# Patient Record
Sex: Female | Born: 1980 | Hispanic: No | Marital: Married | Smoking: Never smoker
Health system: Southern US, Community
[De-identification: ages and names within clinical notes are randomized; demographics above are authoritative.]

## PROBLEM LIST (undated history)

## (undated) ENCOUNTER — Inpatient Hospital Stay (HOSPITAL_COMMUNITY): Payer: Self-pay

## (undated) DIAGNOSIS — D219 Benign neoplasm of connective and other soft tissue, unspecified: Secondary | ICD-10-CM

## (undated) HISTORY — PX: NO PAST SURGERIES: SHX2092

---

## 2015-09-18 ENCOUNTER — Encounter (HOSPITAL_COMMUNITY): Payer: Self-pay | Admitting: *Deleted

## 2015-09-18 ENCOUNTER — Emergency Department (HOSPITAL_COMMUNITY)
Admission: EM | Admit: 2015-09-18 | Discharge: 2015-09-18 | Disposition: A | Discharge status: Home / Self Care | Payer: Medicaid Other | Attending: Physician Assistant | Admitting: Physician Assistant

## 2015-09-18 ENCOUNTER — Emergency Department (HOSPITAL_COMMUNITY): Payer: Medicaid Other

## 2015-09-18 DIAGNOSIS — O3481 Maternal care for other abnormalities of pelvic organs, first trimester: Secondary | ICD-10-CM | POA: Insufficient documentation

## 2015-09-18 DIAGNOSIS — Z3A10 10 weeks gestation of pregnancy: Secondary | ICD-10-CM | POA: Insufficient documentation

## 2015-09-18 DIAGNOSIS — O200 Threatened abortion: Secondary | ICD-10-CM | POA: Diagnosis not present

## 2015-09-18 DIAGNOSIS — O209 Hemorrhage in early pregnancy, unspecified: Secondary | ICD-10-CM | POA: Diagnosis present

## 2015-09-18 DIAGNOSIS — D259 Leiomyoma of uterus, unspecified: Secondary | ICD-10-CM | POA: Insufficient documentation

## 2015-09-18 DIAGNOSIS — O3411 Maternal care for benign tumor of corpus uteri, first trimester: Secondary | ICD-10-CM

## 2015-09-18 LAB — CBC WITH DIFFERENTIAL/PLATELET
BASOS ABS: 0 10*3/uL (ref 0.0–0.1)
Basophils Relative: 1 %
EOS PCT: 3 %
Eosinophils Absolute: 0.2 10*3/uL (ref 0.0–0.7)
HEMATOCRIT: 37 % (ref 36.0–46.0)
Hemoglobin: 12.7 g/dL (ref 12.0–15.0)
LYMPHS ABS: 2.4 10*3/uL (ref 0.7–4.0)
LYMPHS PCT: 32 %
MCH: 30 pg (ref 26.0–34.0)
MCHC: 34.3 g/dL (ref 30.0–36.0)
MCV: 87.3 fL (ref 78.0–100.0)
MONO ABS: 0.5 10*3/uL (ref 0.1–1.0)
MONOS PCT: 6 %
Neutro Abs: 4.4 10*3/uL (ref 1.7–7.7)
Neutrophils Relative %: 58 %
PLATELETS: 214 10*3/uL (ref 150–400)
RBC: 4.24 MIL/uL (ref 3.87–5.11)
RDW: 13.3 % (ref 11.5–15.5)
WBC: 7.6 10*3/uL (ref 4.0–10.5)

## 2015-09-18 LAB — BASIC METABOLIC PANEL
Anion gap: 8 (ref 5–15)
BUN: 8 mg/dL (ref 6–20)
CHLORIDE: 105 mmol/L (ref 101–111)
CO2: 18 mmol/L — ABNORMAL LOW (ref 22–32)
CREATININE: 0.54 mg/dL (ref 0.44–1.00)
Calcium: 8.7 mg/dL — ABNORMAL LOW (ref 8.9–10.3)
GFR calc Af Amer: >60 mL/min (ref >60)
GFR calc non Af Amer: >60 mL/min (ref >60)
GLUCOSE: 83 mg/dL (ref 65–99)
POTASSIUM: 3.7 mmol/L (ref 3.5–5.1)
Sodium: 131 mmol/L — ABNORMAL LOW (ref 135–145)

## 2015-09-18 LAB — WET PREP, GENITAL
Clue Cells Wet Prep HPF POC: NONE SEEN
Trich, Wet Prep: NONE SEEN
WBC WET PREP: NONE SEEN
Yeast Wet Prep HPF POC: NONE SEEN

## 2015-09-18 LAB — I-STAT BETA HCG BLOOD, ED (MC, WL, AP ONLY): I-stat hCG, quantitative: >2000 m[IU]/mL — ABNORMAL HIGH (ref <5)

## 2015-09-18 LAB — ABO/RH: ABO/RH(D): O POS

## 2015-09-18 NOTE — ED Notes (Addendum)
Per pt and family, lmp was 8/19 and pt had + home preg test. Began having light vaginal bleeding 2 days ago.

## 2015-09-18 NOTE — ED Provider Notes (Signed)
CSN: [id number]     Arrival date & time 09/18/15  1714 History   By signing my name below, I, Clement Sayres, attest that this documentation has been prepared under the direction and in the presence of Debroah Baller, NP .  Electronically Signed: Clement Sayres, ED Scribe. 09/18/2015. 9:25 PM.    Chief Complaint  Patient presents with  . Vaginal Bleeding   The history is provided by the patient. No language interpreter was used.   HPI Comments: Erica Roth is a 34 y.o. G1 P0 @ approximately [redacted] weeks gestation presents to the Emergency Department complaining of mild, intermitent vaginal bleeding that began five days ago, resolved after two days and reappeared today. Pt reports that the color of the discharge began as red and is currently brown. Pt has tried over the counter medications with no alleviation. Pt denies prior pregnancy. She has not been seen by an OB regarding the current pregnancy, and has not had an ultrasound. Pt is currently taking over the counter prenatal vitamins. Pt has not had any pap-smears or pelvic exams. Pt denies having intercourse within the past two weeks. She also denies abdominal pain.      History reviewed. No pertinent past medical history. History reviewed. No pertinent past surgical history. History reviewed. No pertinent family history. Social History  Substance Use Topics  . Smoking status: Never Smoker   . Smokeless tobacco: None  . Alcohol Use: No   OB History    Gravida Para Term Preterm AB TAB SAB Ectopic Multiple Living   1              Review of Systems  Constitutional: Negative for fever.  Gastrointestinal: Negative for abdominal pain.  Genitourinary: Positive for vaginal bleeding.  All other systems reviewed and are negative.     Allergies  Review of patient's allergies indicates no known allergies.  Home Medications   Prior to Admission medications   Not on File   BP 114/60 mmHg  Pulse 72  Temp(Src) 97.6 F (36.4 C) (Oral)   Resp 18  SpO2 100%  LMP 07/12/2015 Physical Exam  Constitutional: She is oriented to person, place, and time. She appears well-developed and well-nourished. No distress.  HENT:  Head: Normocephalic and atraumatic.  Eyes: Conjunctivae and EOM are normal.  Neck: Normal range of motion. Neck supple.  Cardiovascular: Normal rate, regular rhythm and normal heart sounds.   Pulmonary/Chest: Effort normal and breath sounds normal. No respiratory distress.  Abdominal: Bowel sounds are normal. There is no tenderness. There is no CVA tenderness.  No CVA tenderness  Genitourinary:  External genitalia without lesions, brown d/c vaginal vault. No CMT, the uterus is enlarged and irregular larger than expected by LMP  Musculoskeletal: Normal range of motion.  Neurological: She is alert and oriented to person, place, and time.  Skin: Skin is warm and dry.  Psychiatric: She has a normal mood and affect. Her behavior is normal.  Nursing note and vitals reviewed.   ED Course  Procedures  DIAGNOSTIC STUDIES: Oxygen Saturation is 97% on RA, normal by my interpretation.    COORDINATION OF CARE: Discussed treatment plan with pt. Pt agreed to plan.   Labs Review Results for orders placed or performed during the hospital encounter of 09/18/15 (from the past 24 hour(s))  CBC with Differential/Platelet     Status: None   Collection Time: 09/18/15  5:49 PM  Result Value Ref Range   WBC 7.6 4.0 - 10.5 K/uL   RBC  4.24 3.87 - 5.11 MIL/uL   Hemoglobin 12.7 12.0 - 15.0 g/dL   HCT 37.0 36.0 - 46.0 %   MCV 87.3 78.0 - 100.0 fL   MCH 30.0 26.0 - 34.0 pg   MCHC 34.3 30.0 - 36.0 g/dL   RDW 13.3 11.5 - 15.5 %   Platelets 214 150 - 400 K/uL   Neutrophils Relative % 58 %   Neutro Abs 4.4 1.7 - 7.7 K/uL   Lymphocytes Relative 32 %   Lymphs Abs 2.4 0.7 - 4.0 K/uL   Monocytes Relative 6 %   Monocytes Absolute 0.5 0.1 - 1.0 K/uL   Eosinophils Relative 3 %   Eosinophils Absolute 0.2 0.0 - 0.7 K/uL   Basophils  Relative 1 %   Basophils Absolute 0.0 0.0 - 0.1 K/uL  Basic metabolic panel     Status: Abnormal   Collection Time: 09/18/15  5:49 PM  Result Value Ref Range   Sodium 131 (L) 135 - 145 mmol/L   Potassium 3.7 3.5 - 5.1 mmol/L   Chloride 105 101 - 111 mmol/L   CO2 18 (L) 22 - 32 mmol/L   Glucose, Bld 83 65 - 99 mg/dL   BUN 8 6 - 20 mg/dL   Creatinine, Ser 0.54 0.44 - 1.00 mg/dL   Calcium 8.7 (L) 8.9 - 10.3 mg/dL   GFR calc non Af Amer >60 >60 mL/min   GFR calc Af Amer >60 >60 mL/min   Anion gap 8 5 - 15  I-Stat beta hCG blood, ED ([facility], [facility], [facility] only)     Status: Abnormal   Collection Time: 09/18/15  5:56 PM  Result Value Ref Range   I-stat hCG, quantitative >2000.0 (H) <5 mIU/mL   Comment 3          ABO/Rh     Status: None   Collection Time: 09/18/15  6:20 PM  Result Value Ref Range   ABO/RH(D) O POS    No rh immune globuloin NOT A RH IMMUNE GLOBULIN CANDIDATE, PT RH POSITIVE   Wet prep, genital     Status: None   Collection Time: 09/18/15  8:20 PM  Result Value Ref Range   Yeast Wet Prep HPF POC NONE SEEN NONE SEEN   Trich, Wet Prep NONE SEEN NONE SEEN   Clue Cells Wet Prep HPF POC NONE SEEN NONE SEEN   WBC, Wet Prep HPF POC NONE SEEN NONE SEEN     Imaging Review US Ob Comp Less 14 Wks  09/18/2015  CLINICAL DATA:  Vaginal bleeding in first trimester pregnancy. Gestational age by LMP of 9 weeks 5 days. EXAM: OBSTETRIC <14 WK [place] AND TRANSVAGINAL OB US TECHNIQUE: Both transabdominal and transvaginal ultrasound examinations were performed for complete evaluation of the gestation as well as the maternal uterus, adnexal regions, and pelvic cul-de-sac. Transvaginal technique was performed to assess early pregnancy. COMPARISON:  None. FINDINGS: Intrauterine gestational sac: Visualized/normal in shape. Yolk sac:  Not visualized Embryo:  Visualized Cardiac Activity: Visualized Heart Rate: 160  bpm CRL:  29  mm   9 w   5 d                  [place] EDC: 04/17/2016 Maternal uterus/adnexae: Multiple  uterine fibroids are seen, predominantly in the anterior corpus and fundus, with largest measuring 6.5 cm. One fibroid in the anterior lower uterine segment shows a area of cystic degeneration. Both ovaries are normal in appearance. No adnexal mass or free fluid identified. IMPRESSION: Single  living IUP measuring 9 weeks 5 days with [place] EDC of 04/17/2016. This is concordant with LMP. Multiple uterine fibroids, largest measuring 6.5 cm. Electronically Signed   By: Earle Gell M.D.   On: 09/18/2015 20:48   US Ob Transvaginal  09/18/2015  CLINICAL DATA:  Vaginal bleeding in first trimester pregnancy. Gestational age by LMP of 9 weeks 5 days. EXAM: OBSTETRIC <14 WK [place] AND TRANSVAGINAL OB US TECHNIQUE: Both transabdominal and transvaginal ultrasound examinations were performed for complete evaluation of the gestation as well as the maternal uterus, adnexal regions, and pelvic cul-de-sac. Transvaginal technique was performed to assess early pregnancy. COMPARISON:  None. FINDINGS: Intrauterine gestational sac: Visualized/normal in shape. Yolk sac:  Not visualized Embryo:  Visualized Cardiac Activity: Visualized Heart Rate: 160  bpm CRL:  29  mm   9 w   5 d                  [place] EDC: 04/17/2016 Maternal uterus/adnexae: Multiple uterine fibroids are seen, predominantly in the anterior corpus and fundus, with largest measuring 6.5 cm. One fibroid in the anterior lower uterine segment shows a area of cystic degeneration. Both ovaries are normal in appearance. No adnexal mass or free fluid identified. IMPRESSION: Single living IUP measuring 9 weeks 5 days with [place] EDC of 04/17/2016. This is concordant with LMP. Multiple uterine fibroids, largest measuring 6.5 cm. Electronically Signed   By: Earle Gell M.D.   On: 09/18/2015 20:48   I have personally reviewed and evaluated these images and lab results as part of my medical decision-making.  Consult with Dr. Elonda Husky, OB on call at [facility]. Will have patient follow up there in 2  weeks for repeat ultrasound. She will go there sooner for any problems.   MDM  34 y.o. female with vaginal bleeding in first trimester pregnancy stable for d/c without pain or heavy bleeding. She will follow up with [facility] in 2 weeks or sooner for heavy bleeding or pain. Pregnancy verification letter given and patient will try to start process for insurance. Discussed with the patient clinical, u/s and lab findings and plan of care. All questioned fully answered.    Final diagnoses:  Threatened abortion in early pregnancy  Uterine fibroids affecting pregnancy in first trimester, antepartum   I personally performed the services described in this documentation, which was scribed in my presence. The recorded information has been reviewed and is accurate.   [place], NP 09/18/15 2128  [place], MD 09/19/15 2255

## 2015-09-18 NOTE — ED Notes (Signed)
The pt returned from [place]

## 2015-09-18 NOTE — ED Notes (Signed)
The pt had a positive preg test one month ago and today she has had some spotting

## 2015-09-18 NOTE — Discharge Instructions (Signed)
I spoke with Dr. Elonda Husky the [facility] doctor at [facility] hospital and he wants you to come there in 2 weeks for recheck and a follow up ultrasound. If your bleeding increases, you develop severe cramping pain or other problems go sooner.

## 2015-09-18 NOTE — ED Notes (Signed)
To us

## 2015-09-19 LAB — GC/CHLAMYDIA PROBE AMP (~~LOC~~) NOT AT ARMC
CHLAMYDIA, DNA PROBE: NEGATIVE
Neisseria Gonorrhea: NEGATIVE

## 2015-10-07 ENCOUNTER — Encounter (HOSPITAL_COMMUNITY): Payer: Self-pay | Admitting: Emergency Medicine

## 2015-10-07 DIAGNOSIS — Z79899 Other long term (current) drug therapy: Secondary | ICD-10-CM | POA: Insufficient documentation

## 2015-10-07 DIAGNOSIS — N390 Urinary tract infection, site not specified: Secondary | ICD-10-CM | POA: Insufficient documentation

## 2015-10-07 DIAGNOSIS — K5900 Constipation, unspecified: Secondary | ICD-10-CM | POA: Diagnosis present

## 2015-10-07 NOTE — ED Notes (Signed)
Pt. reports constipation for several days , feels " bloated" / abdominal pressure with nausea and emesis  , she is 3 months pregnant ( G1P0) and refused blood tests at triage .

## 2015-10-08 ENCOUNTER — Emergency Department (HOSPITAL_COMMUNITY)
Admission: EM | Admit: 2015-10-08 | Discharge: 2015-10-08 | Disposition: A | Discharge status: Home / Self Care | Payer: Medicaid Other | Attending: Emergency Medicine | Admitting: Emergency Medicine

## 2015-10-08 DIAGNOSIS — N390 Urinary tract infection, site not specified: Secondary | ICD-10-CM

## 2015-10-08 DIAGNOSIS — K5900 Constipation, unspecified: Secondary | ICD-10-CM

## 2015-10-08 DIAGNOSIS — R319 Hematuria, unspecified: Secondary | ICD-10-CM

## 2015-10-08 LAB — URINALYSIS, ROUTINE W REFLEX MICROSCOPIC
Bilirubin Urine: NEGATIVE
GLUCOSE, UA: NEGATIVE mg/dL
Leukocytes, UA: NEGATIVE
Nitrite: NEGATIVE
PROTEIN: NEGATIVE mg/dL
Specific Gravity, Urine: 1.012 (ref 1.005–1.030)
Urobilinogen, UA: 0.2 mg/dL (ref 0.0–1.0)
pH: 5 (ref 5.0–8.0)

## 2015-10-08 LAB — I-STAT CHEM 8, ED
BUN: 6 mg/dL (ref 6–20)
CHLORIDE: 103 mmol/L (ref 101–111)
CREATININE: 0.5 mg/dL (ref 0.44–1.00)
Calcium, Ion: 1.11 mmol/L — ABNORMAL LOW (ref 1.12–1.23)
GLUCOSE: 87 mg/dL (ref 65–99)
HCT: 38 % (ref 36.0–46.0)
Hemoglobin: 12.9 g/dL (ref 12.0–15.0)
POTASSIUM: 3.8 mmol/L (ref 3.5–5.1)
Sodium: 135 mmol/L (ref 135–145)
TCO2: 20 mmol/L (ref 0–100)

## 2015-10-08 LAB — URINE MICROSCOPIC-ADD ON

## 2015-10-08 MED ORDER — FLEET ENEMA 7-19 GM/118ML RE ENEM
1.0000 | ENEMA | Freq: Once | RECTAL | Status: DC
  Filled 2015-10-08: qty 1

## 2015-10-08 MED ORDER — ONDANSETRON 4 MG PO TBDP
4.0000 mg | ORAL_TABLET | Freq: Three times a day (TID) | ORAL | Status: DC | PRN
Start: 2015-10-08 — End: 2015-10-18

## 2015-10-08 MED ORDER — FLEET ENEMA 7-19 GM/118ML RE ENEM: 1.0000 | ENEMA | Freq: Every day | RECTAL | Status: DC | PRN

## 2015-10-08 MED ORDER — CEPHALEXIN 500 MG PO CAPS: 500.0000 mg | ORAL_CAPSULE | Freq: Four times a day (QID) | ORAL | Status: DC

## 2015-10-08 MED ORDER — DOCUSATE SODIUM 100 MG PO CAPS: 100.0000 mg | ORAL_CAPSULE | Freq: Two times a day (BID) | ORAL | Status: DC

## 2015-10-08 MED ORDER — CEPHALEXIN 250 MG PO CAPS
500.0000 mg | ORAL_CAPSULE | Freq: Once | ORAL | Status: DC
  Filled 2015-10-08: qty 2

## 2015-10-08 MED ORDER — SODIUM CHLORIDE 0.9 % IV BOLUS (SEPSIS)
1000.0000 mL | Freq: Once | INTRAVENOUS | Status: AC
  Administered 2015-10-08: 1000 mL via INTRAVENOUS

## 2015-10-08 MED ORDER — PSYLLIUM 0.52 G PO CAPS: 0.5200 g | ORAL_CAPSULE | Freq: Every day | ORAL | Status: DC

## 2015-10-08 MED ORDER — DOCUSATE SODIUM 100 MG PO CAPS
100.0000 mg | ORAL_CAPSULE | Freq: Once | ORAL | Status: AC
  Administered 2015-10-08: 100 mg via ORAL
  Filled 2015-10-08: qty 1

## 2015-10-08 NOTE — ED Provider Notes (Signed)
By signing my name below, I, Meriel Pica, attest that this documentation has been prepared under the direction and in the presence of [place], DO. Electronically Signed: Meriel Pica, ED Scribe. 10/08/2015. 2:15 AM.  TIME SEEN: 1:32 AM  CHIEF COMPLAINT: Constipation   HPI:  HPI Comments: Erica Roth is a 34 y.o. female, who is 12 weeks 3 days gestation (G1P0, LMP 07/13/15), presents to the Emergency Department complaining of constant, moderate constipation. She associates a decreased appetite and nausea with eating and drinking. Also notes emesis X 3 days with 4 episodes yesterday. She has a h/o constipation for which she would take fiber supplements PRN that alleviated her symptoms. Pt has not tried any alleviating medication or treatments PTA; although, she states she purchased an enema today but has not tried this treatment as of yet. No PShx to abdomen. Denies a history of bowel obstruction. Denies fevers or chills, any urinary symptoms, and vaginal bleeding or discharge. LNMP was 3 months ago. She has not been evaluated by an OB GYN for her current pregnancy but reports [facility]; she does not have an appointment at [facility]. Patient had an abdominal ultrasound on October 26 which confirmed a single IUP consistent with dates. She was also seen to have uterine fibroids at that time.   PCP: none OB: none   ROS: See HPI Constitutional: no fever  Eyes: no drainage  ENT: no runny nose   Cardiovascular:  no chest pain  Resp: no SOB  GI: vomiting GU: no dysuria Integumentary: no rash  Allergy: no hives  Musculoskeletal: no leg swelling  Neurological: no slurred speech ROS otherwise negative  PAST MEDICAL HISTORY/PAST SURGICAL HISTORY:  History reviewed. No pertinent past medical history.  MEDICATIONS:  Prior to Admission medications   Medication Sig Start Date End Date Taking? Authorizing Provider  Prenatal Vit-Fe  Fumarate-FA (PRENATAL MULTIVITAMIN) TABS tablet Take 1 tablet by mouth daily at 12 noon.   Yes Historical Provider, MD    ALLERGIES:  No Known Allergies  SOCIAL HISTORY:  Social History  Substance Use Topics  . Smoking status: Never Smoker   . Smokeless tobacco: Not on file  . Alcohol Use: No    FAMILY HISTORY: No family history on file.  EXAM: BP 120/83 mmHg  Pulse 79  Temp(Src) 98 F (36.7 C) (Oral)  Resp 20  Wt 206 lb (93.441 kg)  SpO2 97%  LMP 07/13/2015 CONSTITUTIONAL: Alert and oriented and responds appropriately to questions. Well-appearing; well-nourished HEAD: Normocephalic EYES: Conjunctivae clear, PERRL ENT: normal nose; no rhinorrhea; moist mucous membranes; pharynx without lesions noted NECK: Supple, no meningismus, no LAD  CARD: RRR; S1 and S2 appreciated; no murmurs, no clicks, no rubs, no gallops RESP: Normal chest excursion without splinting or tachypnea; breath sounds clear and equal bilaterally; no wheezes, no rhonchi, no rales, no hypoxia or respiratory distress, speaking full sentences ABD/GI: Normal bowel sounds; non-distended; soft, non-tender, no rebound, no guarding, no peritoneal signs, no tympany or fluid wave; refused rectal exam. BACK:  The back appears normal and is non-tender to palpation, there is no CVA tenderness EXT: Normal ROM in all joints; non-tender to palpation; no edema; normal capillary refill; no cyanosis, no calf tenderness or swelling    SKIN: Normal color for age and race; warm NEURO: Moves all extremities equally, sensation to light touch intact diffusely, cranial nerves II through XII intact PSYCH: The patient's mood and manner are appropriate. Grooming and personal hygiene  are appropriate.  MEDICAL DECISION MAKING: Patient here with constipation. Has a prior history of constipation and has not tried anything at home. Her abdominal exam is completely benign. No distention, tympany. Normal bowel sounds. Doubt that this is a bowel  obstruction. I do not feel she needs an x-ray at this time given she is pregnant and this would expose the fetus to radiation. Not tender at McBurney's point. Negative Murphy sign. She refuses rectal exam to see if she has a fecal impaction. She agrees however to an enema. Will obtain urinalysis as well as.  ED PROGRESS: Urine shows large ketones and blood. Urine also shows rare bacteria. No other sign of infection. Culture pending. She adamantly denies any vaginal bleeding. We'll treat for possible UTI with Keflex. We'll give IV fluids given her large ketones. Will obtain labs.   3:15 AM  Pt's labs are unremarkable. She reports feeling better after fluids, enema and Colace. No vomiting in the ED. Drinking without difficulty. FHTs 160s. I feel she is safe to be discharged home. We'll give her outpatient follow-up information. I discussed with her that is safe to use Metamucil, Colace and Fleet enemas as needed. Discharge with prescription for Keflex for her UTI. Discussed return precautions. She verbalizes understanding and is comfortable with this plan.   I personally performed the services described in this documentation, which was scribed in my presence. The recorded information has been reviewed and is accurate.    [place], DO 10/08/15 [phone]

## 2015-10-08 NOTE — ED Notes (Signed)
Attempt to locate [facility] unsuccessful; another RN will attempt

## 2015-10-08 NOTE — Discharge Instructions (Signed)
Constipation, Adult Constipation is when a person has fewer than three bowel movements a week, has difficulty having a bowel movement, or has stools that are dry, hard, or larger than normal. As people grow older, constipation is more common. A low-fiber diet, not taking in enough fluids, and taking certain medicines may make constipation worse.  CAUSES   Certain medicines, such as antidepressants, pain medicine, iron supplements, antacids, and water pills.   Certain diseases, such as diabetes, irritable bowel syndrome (IBS), thyroid disease, or depression.   Not drinking enough water.   Not eating enough fiber-rich foods.   Stress or travel.   Lack of physical activity or exercise.   Ignoring the urge to have a bowel movement.   Using laxatives too much.  SIGNS AND SYMPTOMS   Having fewer than three bowel movements a week.   Straining to have a bowel movement.   Having stools that are hard, dry, or larger than normal.   Feeling full or bloated.   Pain in the lower abdomen.   Not feeling relief after having a bowel movement.  DIAGNOSIS  Your health care provider will take a medical history and perform a physical exam. Further testing may be done for severe constipation. Some tests may include:  A barium enema X-ray to examine your rectum, colon, and, sometimes, your small intestine.   A sigmoidoscopy to examine your lower colon.   A colonoscopy to examine your entire colon. TREATMENT  Treatment will depend on the severity of your constipation and what is causing it. Some dietary treatments include drinking more fluids and eating more fiber-rich foods. Lifestyle treatments may include regular exercise. If these diet and lifestyle recommendations do not help, your health care provider may recommend taking over-the-counter laxative medicines to help you have bowel movements. Prescription medicines may be prescribed if over-the-counter medicines do not work.    HOME CARE INSTRUCTIONS   Eat foods that have a lot of fiber, such as fruits, vegetables, whole grains, and beans.  Limit foods high in fat and processed sugars, such as french fries, hamburgers, cookies, candies, and soda.   A fiber supplement may be added to your diet if you cannot get enough fiber from foods.   Drink enough fluids to keep your urine clear or pale yellow.   Exercise regularly or as directed by your health care provider.   Go to the restroom when you have the urge to go. Do not hold it.   Only take over-the-counter or prescription medicines as directed by your health care provider. Do not take other medicines for constipation without talking to your health care provider first.  [place] IF:   You have bright red blood in your stool.   Your constipation lasts for more than 4 days or gets worse.   You have abdominal or rectal pain.   You have thin, pencil-like stools.   You have unexplained weight loss. MAKE SURE YOU:   Understand these instructions.  Will watch your condition.  Will get help right away if you are not doing well or get worse.   This information is not intended to replace advice given to you by your health care provider. Make sure you discuss any questions you have with your health care provider.   Document Released: 08/07/2004 Document Revised: 11/30/2014 Document Reviewed: 08/21/2013 Elsevier Interactive Patient Education 2016 Elsevier Inc.  High-Fiber Diet Fiber, also called dietary fiber, is a type of carbohydrate found in fruits, vegetables, whole grains, and  beans. A high-fiber diet can have many health benefits. Your health care provider may recommend a high-fiber diet to help: °· Prevent constipation. Fiber can make your bowel movements more regular. °· Lower your cholesterol. °· Relieve hemorrhoids, uncomplicated diverticulosis, or irritable bowel syndrome. °· Prevent overeating as part of a weight-loss  plan. °· Prevent heart disease, type 2 diabetes, and certain cancers. °WHAT IS MY PLAN? °The recommended daily intake of fiber includes: °· 38 grams for men under age 50. °· 30 grams for men over age 50. °· 25 grams for women under age 50. °· 21 grams for women over age 50. °You can get the recommended daily intake of dietary fiber by eating a variety of fruits, vegetables, grains, and beans. Your health care provider may also recommend a fiber supplement if it is not possible to get enough fiber through your diet. °WHAT DO I NEED TO KNOW ABOUT A HIGH-FIBER DIET? °· Fiber supplements have not been widely studied for their effectiveness, so it is better to get fiber through food sources. °· Always check the fiber content on the nutrition facts label of any prepackaged food. Look for foods that contain at least 5 grams of fiber per serving. °· Ask your dietitian if you have questions about specific foods that are related to your condition, especially if those foods are not listed in the following section. °· Increase your daily fiber consumption gradually. Increasing your intake of dietary fiber too quickly may cause bloating, cramping, or gas. °· Drink plenty of water. Water helps you to digest fiber. °WHAT FOODS CAN I EAT? °Grains °Whole-grain breads. Multigrain cereal. Oats and oatmeal. Brown rice. Barley. Bulgur wheat. Millet. Bran muffins. Popcorn. Rye wafer crackers. °Vegetables °Sweet potatoes. Spinach. Kale. Artichokes. Cabbage. Broccoli. Green peas. Carrots. Squash. °Fruits °Berries. Pears. Apples. Oranges. Avocados. Prunes and raisins. Dried figs. °Meats and Other Protein Sources °[place]y, kidney, pinto, and soy beans. Split peas. Lentils. Nuts and seeds. °Dairy °Fiber-fortified yogurt. °Beverages °Fiber-fortified soy milk. Fiber-fortified orange juice. °Other °Fiber bars. °The items listed above may not be a complete list of recommended foods or beverages. Contact your dietitian for more options. °WHAT FOODS  ARE NOT RECOMMENDED? °Grains °White bread. Pasta made with refined flour. White rice. °Vegetables °Fried potatoes. Canned vegetables. Well-cooked vegetables.  °Fruits °Fruit juice. Cooked, strained fruit. °Meats and Other Protein Sources °Fatty cuts of meat. Fried poultry or fried fish. °Dairy °Milk. Yogurt. Cream cheese. Sour cream. °Beverages °Soft drinks. °Other °Cakes and pastries. Butter and oils. °The items listed above may not be a complete list of foods and beverages to avoid. Contact your dietitian for more information. °WHAT ARE SOME TIPS FOR INCLUDING HIGH-FIBER FOODS IN MY DIET? °· Eat a wide variety of high-fiber foods. °· Make sure that half of all grains consumed each day are whole grains. °· Replace breads and cereals made from refined flour or white flour with whole-grain breads and cereals. °· Replace white rice with brown rice, bulgur wheat, or millet. °· Start the day with a breakfast that is high in fiber, such as a cereal that contains at least 5 grams of fiber per serving. °· Use beans in place of meat in soups, salads, or pasta. °· Eat high-fiber snacks, such as berries, raw vegetables, nuts, or popcorn. °  °This information is not intended to replace advice given to you by your health care provider. Make sure you discuss any questions you have with your health care provider. °  °Document Released: 11/09/2005 Document Revised: 11/30/2014 Document   Reviewed: 04/24/2014 Elsevier Interactive Patient Education 2016 Elsevier Inc.  Urinary Tract Infection Urinary tract infections (UTIs) can develop anywhere along your urinary tract. Your urinary tract is your body's drainage system for removing wastes and extra water. Your urinary tract includes two kidneys, two ureters, a bladder, and a urethra. Your kidneys are a pair of bean-shaped organs. Each kidney is about the size of your fist. They are located below your ribs, one on each side of your spine. CAUSES Infections are caused by microbes,  which are microscopic organisms, including fungi, viruses, and bacteria. These organisms are so small that they can only be seen through a microscope. Bacteria are the microbes that most commonly cause UTIs. SYMPTOMS  Symptoms of UTIs may vary by age and gender of the patient and by the location of the infection. Symptoms in young women typically include a frequent and intense urge to urinate and a painful, burning feeling in the bladder or urethra during urination. Older women and men are more likely to be tired, shaky, and weak and have muscle aches and abdominal pain. A fever may mean the infection is in your kidneys. Other symptoms of a kidney infection include pain in your back or sides below the ribs, nausea, and vomiting. DIAGNOSIS To diagnose a UTI, your caregiver will ask you about your symptoms. Your caregiver will also ask you to provide a urine sample. The urine sample will be tested for bacteria and white blood cells. White blood cells are made by your body to help fight infection. TREATMENT  Typically, UTIs can be treated with medication. Because most UTIs are caused by a bacterial infection, they usually can be treated with the use of antibiotics. The choice of antibiotic and length of treatment depend on your symptoms and the type of bacteria causing your infection. HOME CARE INSTRUCTIONS  If you were prescribed antibiotics, take them exactly as your caregiver instructs you. Finish the medication even if you feel better after you have only taken some of the medication.  Drink enough water and fluids to keep your urine clear or pale yellow.  Avoid caffeine, tea, and carbonated beverages. They tend to irritate your bladder.  Empty your bladder often. Avoid holding urine for long periods of time.  Empty your bladder before and after sexual intercourse.  After a bowel movement, women should cleanse from front to back. Use each tissue only once. SEEK MEDICAL CARE IF:   You have back  pain.  You develop a fever.  Your symptoms do not begin to resolve within 3 days. SEEK IMMEDIATE MEDICAL CARE IF:   You have severe back pain or lower abdominal pain.  You develop chills.  You have nausea or vomiting.  You have continued burning or discomfort with urination. MAKE SURE YOU:   Understand these instructions.  Will watch your condition.  Will get help right away if you are not doing well or get worse.   This information is not intended to replace advice given to you by your health care provider. Make sure you discuss any questions you have with your health care provider.   Document Released: 08/19/2005 Document Revised: 07/31/2015 Document Reviewed: 12/18/2011 Elsevier Interactive Patient Education [employer]. .

## 2015-10-09 LAB — URINE CULTURE

## 2015-10-18 ENCOUNTER — Inpatient Hospital Stay (HOSPITAL_COMMUNITY)
Admission: AD | Admit: 2015-10-18 | Discharge: 2015-10-18 | Disposition: A | Discharge status: Home / Self Care | Payer: Medicaid Other | Source: Ambulatory Visit | Attending: Family Medicine | Admitting: Family Medicine

## 2015-10-18 ENCOUNTER — Encounter (HOSPITAL_COMMUNITY): Payer: Self-pay | Admitting: *Deleted

## 2015-10-18 ENCOUNTER — Inpatient Hospital Stay (HOSPITAL_COMMUNITY): Payer: Medicaid Other

## 2015-10-18 DIAGNOSIS — O26892 Other specified pregnancy related conditions, second trimester: Secondary | ICD-10-CM

## 2015-10-18 DIAGNOSIS — N898 Other specified noninflammatory disorders of vagina: Secondary | ICD-10-CM | POA: Diagnosis present

## 2015-10-18 DIAGNOSIS — O42919 Preterm premature rupture of membranes, unspecified as to length of time between rupture and onset of labor, unspecified trimester: Secondary | ICD-10-CM

## 2015-10-18 DIAGNOSIS — Z3A14 14 weeks gestation of pregnancy: Secondary | ICD-10-CM | POA: Insufficient documentation

## 2015-10-18 DIAGNOSIS — O4102X0 Oligohydramnios, second trimester, not applicable or unspecified: Secondary | ICD-10-CM | POA: Insufficient documentation

## 2015-10-18 HISTORY — DX: Benign neoplasm of connective and other soft tissue, unspecified: D21.9

## 2015-10-18 LAB — WET PREP, GENITAL
Clue Cells Wet Prep HPF POC: NONE SEEN
SPERM: NONE SEEN
Trich, Wet Prep: NONE SEEN
WBC, Wet Prep HPF POC: NONE SEEN
YEAST WET PREP: NONE SEEN

## 2015-10-18 LAB — OB RESULTS CONSOLE GC/CHLAMYDIA: Gonorrhea: NEGATIVE

## 2015-10-18 NOTE — Discharge Instructions (Signed)
You must follow up in our clinic next week. Take your temperature daily and return if above 101. Nothing is to go in the vagina, no intercourse, no baths (you may shower), no tampons.  Premature Rupture and Preterm Premature Rupture of Membranes Premature rupture of membranes (PROM) is when the membranes (amniotic sac) break open before contractions or labor starts. Rupture of membranes is commonly referred to as your water breaking. If PROM occurs before 37 weeks of pregnancy, it is called preterm premature rupture of membranes (PPROM). The amniotic sac holds the fetus, keeps infection out, and performs other important functions. Having the amniotic sac rupture before 37 weeks of pregnancy can lead to serious problems and requires immediate attention by your health care provider. CAUSES  PROM near the end of the pregnancy may be caused by natural weakening of the membranes. PPROM is often due to an infection. Other factors that may be associated with PROM include:  Stretching of the amniotic sac because of carrying multiples or having too much amniotic fluid.  Trauma.  Smoking during pregnancy.  Poor nutrition.  Previous preterm birth.  Vaginal bleeding.  Little to no prenatal care.  Problems with the placenta, such as placenta previa or placental abruption. RISKS OF PROM AND PPROM  Delivering a premature baby.  Getting a serious infection of the placental tissues (chorioamnionitis).  Early detachment of the placenta from the uterus (placental abruption).  Compression of the umbilical cord.  Needing a cesarean birth.  Developing a serious infection after delivery.  Inability for the baby to breathe  Miscarriage  SIGNS OF PROM OR PPROM   A sudden gush or slow leaking of fluid from the vagina.  Constant wet underwear. Sometimes, women mistake the leaking or wetness for urine, especially if the leak is slow and not a gush of fluid. If there is constant leaking or your  underwear continues to get wet, your membranes have likely ruptured. WHAT TO DO IF YOU THINK YOUR MEMBRANES HAVE RUPTURED Call your health care provider right away. You will need to go to the hospital to get checked immediately. WHAT HAPPENS IF YOU ARE DIAGNOSED WITH PROM OR PPROM? Once you arrive at the hospital, you will have tests done. A cervical exam will be performed to check if the cervix has softened or started to open (dilate). If you are diagnosed with PROM, you may be induced within 24 hours if you are not having contractions. If you are diagnosed with PPROM and are not having contractions, you may be induced depending on your trimester.  If you have PPROM, you:  And your baby will be monitored closely for signs of infection or other complications.  May be given an antibiotic medicine to lower the chances of an infection developing.  May be given a steroid medicine to help mature the baby's lungs faster.  May be given a medicine to stop preterm labor.  May be ordered to be on bed rest at home or in the hospital.  May be induced if complications arise for you or the baby. Your treatment will depend on many factors, such as how far along you are, the development of the baby, and other complications that may arise.   This information is not intended to replace advice given to you by your health care provider. Make sure you discuss any questions you have with your health care provider.   Document Released: 11/09/2005 Document Revised: 08/30/2013 Document Reviewed: 02/28/2013 Elsevier Interactive Patient Education [employer].

## 2015-10-18 NOTE — MAU Note (Signed)
Pt presents to MAU with complaints of clear discharge that started last night. Denies any vaginal bleeding. Denies any pain

## 2015-10-18 NOTE — MAU Provider Note (Signed)
History     CSN: [id number]  Arrival date and time: 10/18/15 [id number]   First Provider Initiated Contact with Patient 10/18/15 2006      Chief Complaint  Patient presents with  . Vaginal Discharge   HPI  [name] is a 34 y.o. G1P0 at [redacted]w[redacted]d who presents to MAU today with complaint of LOF. The patient states that she had a gush of watery fluid yesterday which continued to leak until earlier today. She denies LOF now. She denies vaginal bleeding, abdominal pain, fever or UTI symptoms. She does continue to have N/V 2-3 times most days. She is taking Emetrol for this with some relief.   OB History    Gravida Para Term Preterm AB TAB SAB Ectopic Multiple Living   1               Past Medical History  Diagnosis Date  . Fibroid     Past Surgical History  Procedure Laterality Date  . No past surgeries      No family history on file.  Social History  Substance Use Topics  . Smoking status: Never Smoker   . Smokeless tobacco: None  . Alcohol Use: No    Allergies: No Known Allergies  Prescriptions prior to admission  Medication Sig Dispense Refill Last Dose  . anti-nausea (EMETROL) solution Take 10 mLs by mouth every 15 (fifteen) minutes as needed for nausea or vomiting.   10/17/2015 at Unknown time  . Prenatal Vit-Fe Fumarate-FA (PRENATAL MULTIVITAMIN) TABS tablet Take 1 tablet by mouth daily at 12 noon.   10/17/2015 at Unknown time  . cephALEXin (KEFLEX) 500 MG capsule Take 1 capsule (500 mg total) by mouth 4 (four) times daily. (Patient not taking: Reported on 10/18/2015) 28 capsule 0 Not Taking at Unknown time  . docusate sodium (COLACE) 100 MG capsule Take 1 capsule (100 mg total) by mouth every 12 (twelve) hours. (Patient not taking: Reported on 10/18/2015) 60 capsule 0 Not Taking at Unknown time  . ondansetron (ZOFRAN ODT) 4 MG disintegrating tablet Take 1 tablet (4 mg total) by mouth every 8 (eight) hours as needed for nausea or vomiting. (Patient not taking:  Reported on 10/18/2015) 10 tablet 0 Not Taking at Unknown time  . psyllium (METAMUCIL) 0.52 G capsule Take 1 capsule (0.52 g total) by mouth daily. (Patient not taking: Reported on 10/18/2015) 30 capsule 0 Not Taking at Unknown time  . sodium phosphate (FLEET) 7-19 GM/118ML ENEM Place 133 mLs (1 enema total) rectally daily as needed for severe constipation. (Patient not taking: Reported on 10/18/2015) 10 enema 0 Not Taking at Unknown time    Review of Systems  Constitutional: Negative for fever and malaise/fatigue.  Gastrointestinal: Positive for nausea and vomiting. Negative for abdominal pain, diarrhea and constipation.  Genitourinary: Negative for dysuria, urgency and frequency.       Neg - vaginal bleeding + vaginal discharge   Physical Exam   Blood pressure 137/70, pulse 69, temperature 97.8 F (36.6 C), resp. rate 18, last menstrual period 07/12/2015.  Physical Exam  Nursing note and vitals reviewed. Constitutional: She is oriented to person, place, and time. She appears well-developed and well-nourished. No distress.  HENT:  Head: Normocephalic and atraumatic.  Cardiovascular: Normal rate.   Respiratory: Effort normal.  GI: Soft. She exhibits no distension and no mass. There is no tenderness. There is no rebound and no guarding.  Genitourinary: Uterus is enlarged (appropriate for GA). Uterus is not tender. Cervix exhibits no motion tenderness  and no discharge. No bleeding in the vagina. Vaginal discharge (scant thin, white discharge noted) found.  Unable to assess cervix, patient would not tolerate exam  Neurological: She is alert and oriented to person, place, and time.  Skin: Skin is warm and dry. No erythema.  Psychiatric: She has a normal mood and affect.   Results for orders placed or performed during the hospital encounter of 10/18/15 (from the past 24 hour(s))  Wet prep, genital     Status: None   Collection Time: 10/18/15  8:13 PM  Result Value Ref Range   Yeast Wet  Prep HPF POC NONE SEEN NONE SEEN   Trich, Wet Prep NONE SEEN NONE SEEN   Clue Cells Wet Prep HPF POC NONE SEEN NONE SEEN   WBC, Wet Prep HPF POC NONE SEEN NONE SEEN   Sperm NONE SEEN     MAU Course  Procedures None  MDM FHR - 155 bpm with doppler UA, wet prep, GC/Chlamydia today [place] - negative [place] to assess AFI due to amount of described loss of fluid Preliminary [place] report showed oligohydramnios.  Discussed patient with Dr. Kennon Rounds. She is present in MAU to discuss results and options for management with the patient. Assessment and Plan  A: SIUP at [redacted]w[redacted]d Oligohydramnios   P: Discharge home Warning signs for infection and miscarriage discussed Patient advised to follow-up with [facility] on Thursday at 1:00 pm Patient may return to MAU as needed or if her condition were to change or worsen   Luvenia Redden, PA-C  10/18/2015, 11:27 PM

## 2015-10-21 LAB — GC/CHLAMYDIA PROBE AMP (~~LOC~~) NOT AT ARMC
Chlamydia: NEGATIVE
Neisseria Gonorrhea: NEGATIVE

## 2015-10-24 ENCOUNTER — Encounter: Payer: Medicaid Other | Admitting: Obstetrics & Gynecology

## 2015-10-26 ENCOUNTER — Inpatient Hospital Stay (HOSPITAL_COMMUNITY): Payer: Medicaid Other

## 2015-10-26 ENCOUNTER — Encounter (HOSPITAL_COMMUNITY): Payer: Self-pay | Admitting: *Deleted

## 2015-10-26 ENCOUNTER — Inpatient Hospital Stay (HOSPITAL_COMMUNITY)
Admission: AD | Admit: 2015-10-26 | Discharge: 2015-10-26 | Disposition: A | Discharge status: Home / Self Care | Payer: Medicaid Other | Source: Ambulatory Visit | Attending: Obstetrics and Gynecology | Admitting: Obstetrics and Gynecology

## 2015-10-26 DIAGNOSIS — O3412 Maternal care for benign tumor of corpus uteri, second trimester: Secondary | ICD-10-CM | POA: Insufficient documentation

## 2015-10-26 DIAGNOSIS — O42912 Preterm premature rupture of membranes, unspecified as to length of time between rupture and onset of labor, second trimester: Secondary | ICD-10-CM | POA: Insufficient documentation

## 2015-10-26 DIAGNOSIS — Z3A15 15 weeks gestation of pregnancy: Secondary | ICD-10-CM | POA: Diagnosis not present

## 2015-10-26 DIAGNOSIS — D219 Benign neoplasm of connective and other soft tissue, unspecified: Secondary | ICD-10-CM

## 2015-10-26 DIAGNOSIS — D259 Leiomyoma of uterus, unspecified: Secondary | ICD-10-CM | POA: Diagnosis not present

## 2015-10-26 DIAGNOSIS — O26891 Other specified pregnancy related conditions, first trimester: Secondary | ICD-10-CM | POA: Diagnosis present

## 2015-10-26 DIAGNOSIS — O42011 Preterm premature rupture of membranes, onset of labor within 24 hours of rupture, first trimester: Secondary | ICD-10-CM

## 2015-10-26 LAB — URINALYSIS, ROUTINE W REFLEX MICROSCOPIC
Bilirubin Urine: NEGATIVE
GLUCOSE, UA: NEGATIVE mg/dL
HGB URINE DIPSTICK: NEGATIVE
Ketones, ur: NEGATIVE mg/dL
Leukocytes, UA: NEGATIVE
Nitrite: NEGATIVE
Protein, ur: NEGATIVE mg/dL
pH: 5.5 (ref 5.0–8.0)

## 2015-10-26 NOTE — MAU Provider Note (Signed)
History   34 yo G1P0 at 79 1/7 weeks by early [place] presented unannounced c/o continued leaking--seen at MAU 11/25 with leaking, with US showing minimal fluid, AFV 0.66 cm.  Dr. Kennon Rounds saw the patient and discussed implications of PPROM, with patient declining TOP.  She was directed to f/u at [facility], but she elected to keep her NOB appt at [facility] on 10/24/15.  Had [place] that day, with AFV 1.9 cm.  Plan made for amnisure on 10/25/15 in MAU, but patient has demonstrated continued leaking, making amnisure unnecessary.  She is to be referred to MFM for further discussion of the plan of care.    She denies fever, bleeding, foul odor to fluid, abdominal pain, or any other sx.  This is a desired pregnancy, and patient continues to decline TOP.  [place] on 09/18/15 at [facility]:  SIUP, 9 5/7 weeks, EDC 04/18/15, with multiple fibroids noted, predominently in the anterior corpus and fundus, with largest measuring 6.5 cm.  One fibroid in the anterior LUS shows an area of cystic degeneration.  [place] at MAU 10/18/15:  AFV 0.66, anterior fibroid 3.59 x 2.73 x 2.23, anterior LUS 7.87 x 7.01 x 5.87  [place] at [place] 10/24/15:  AFV 1.9, fibroid 6.64 x 6.45 x 7.28.    Patient Active Problem List   Diagnosis Date Noted  . Preterm premature rupture of membranes (PPROM) with unknown onset of labor 10/18/2015    Chief Complaint  Patient presents with  . Vaginal Discharge   HPI:  As above  OB History    Gravida Para Term Preterm AB TAB SAB Ectopic Multiple Living   1               Past Medical History  Diagnosis Date  . Fibroid     Past Surgical History  Procedure Laterality Date  . No past surgeries      History reviewed. No pertinent family history.  Social History  Substance Use Topics  . Smoking status: Never Smoker   . Smokeless tobacco: None  . Alcohol Use: No    Allergies: No Known Allergies  Prescriptions prior to admission  Medication Sig Dispense Refill Last Dose  . Prenatal Vit-Fe Fumarate-FA  (PRENATAL MULTIVITAMIN) TABS tablet Take 1 tablet by mouth daily at 12 noon.   10/25/2015 at Unknown time    ROS:  Leaking fluid Physical Exam   Blood pressure 119/64, pulse 75, temperature 97.9 F (36.6 C), temperature source Oral, resp. rate 18, height 5' 9.5" (1.765 m), weight 92.534 kg (204 lb), last menstrual period 07/12/2015.    Physical Exam  In NAD Chest clear Heart RRR without murmur Abd gravid, NT, fundal height 18 weeks. Pelvic--leaking small amount clear fluid Ext WNL  FHR 167 Bedside US shows minimal fluid.  ED Course  Assessment: PPROM at 14 weeks on 10/18/15 Fibroid uterus  Plan: Reviewed dx of PPROM with patient and husband, including need for monitoring maternal temp, continuing pelvic rest, and planning referral appt to MFM ASAP.  I discussed likely recommendation for ATB for latency at 22-23 weeks, with betamethasone by 24 weeks. Discussed high risk of pregnancy loss, and lack of absolute rationale for why PROM occurred. Support to patient and husband for concerns about pregnancy.' Patient will continue to monitor temp daily, continue pelvic rest, call for temp > or = 100, or any foul d/c, abdominal pain, or bleeding. I will have office Triage contact MFM on Monday, with goal for MFM appt on Thursday (husband's best  day for appt).   Donnel Saxon CNM, MSN 10/26/2015 4:01 PM

## 2015-10-26 NOTE — MAU Note (Signed)
Did not go to [facility] on Thurs, went to [facility] instead

## 2015-10-26 NOTE — Discharge Instructions (Signed)
Premature Rupture and Preterm Premature Rupture of Membranes Premature rupture of membranes (PROM) is when the membranes (amniotic sac) break open before contractions or labor starts. Rupture of membranes is commonly referred to as your water breaking. If PROM occurs before 37 weeks of pregnancy, it is called preterm premature rupture of membranes (PPROM). The amniotic sac holds the fetus, keeps infection out, and performs other important functions. Having the amniotic sac rupture before 37 weeks of pregnancy can lead to serious problems and requires immediate attention by your health care provider. CAUSES  PROM near the end of the pregnancy may be caused by natural weakening of the membranes. PPROM is often due to an infection. Other factors that may be associated with PROM include:  Stretching of the amniotic sac because of carrying multiples or having too much amniotic fluid.  Trauma.  Smoking during pregnancy.  Poor nutrition.  Previous preterm birth.  Vaginal bleeding.  Little to no prenatal care.  Problems with the placenta, such as placenta previa or placental abruption. RISKS OF PROM AND PPROM  Delivering a premature baby.  Getting a serious infection of the placental tissues (chorioamnionitis).  Early detachment of the placenta from the uterus (placental abruption).  Compression of the umbilical cord.  Needing a cesarean birth.  Developing a serious infection after delivery. SIGNS OF PROM OR PPROM   A sudden gush or slow leaking of fluid from the vagina.  Constant wet underwear. Sometimes, women mistake the leaking or wetness for urine, especially if the leak is slow and not a gush of fluid. If there is constant leaking or your underwear continues to get wet, your membranes have likely ruptured. WHAT TO DO IF YOU THINK YOUR MEMBRANES HAVE RUPTURED Call your health care provider right away. You will need to go to the hospital to get checked immediately. WHAT HAPPENS  IF YOU ARE DIAGNOSED WITH PROM OR PPROM? Once you arrive at the hospital, you will have tests done. A cervical exam will be performed to check if the cervix has softened or started to open (dilate). If you are diagnosed with PROM, you may be induced within 24 hours if you are not having contractions. If you are diagnosed with PPROM and are not having contractions, you may be induced depending on your trimester.  If you have PPROM, you:  And your baby will be monitored closely for signs of infection or other complications.  May be given an antibiotic medicine to lower the chances of an infection developing.  May be given a steroid medicine to help mature the baby's lungs faster.  May be given a medicine to stop preterm labor.  May be ordered to be on bed rest at home or in the hospital.  May be induced if complications arise for you or the baby. Your treatment will depend on many factors, such as how far along you are, the development of the baby, and other complications that may arise.   This information is not intended to replace advice given to you by your health care provider. Make sure you discuss any questions you have with your health care provider.   Document Released: 11/09/2005 Document Revised: 08/30/2013 Document Reviewed: 02/28/2013 Elsevier Interactive Patient Education [employer].

## 2015-10-26 NOTE — MAU Note (Addendum)
approx 2 hrs ago, had a small gush of fluid, none since.Marland Kitchen Has had this problem before.  Was seen at [facility] for same, and again here 11/25, was told fluid was low.  Has appt Tues at [facility]

## 2015-10-28 LAB — OB RESULTS CONSOLE HIV ANTIBODY (ROUTINE TESTING): HIV: NONREACTIVE

## 2015-10-28 LAB — OB RESULTS CONSOLE RUBELLA ANTIBODY, IGM: RUBELLA: IMMUNE

## 2015-10-28 LAB — OB RESULTS CONSOLE HEPATITIS B SURFACE ANTIGEN: Hepatitis B Surface Ag: NEGATIVE

## 2015-10-29 ENCOUNTER — Ambulatory Visit (HOSPITAL_COMMUNITY): Payer: Medicaid Other

## 2015-10-29 ENCOUNTER — Ambulatory Visit (HOSPITAL_COMMUNITY)
Admission: RE | Admit: 2015-10-29 | Discharge: 2015-10-29 | Disposition: A | Discharge status: Home / Self Care | Payer: Medicaid Other | Source: Ambulatory Visit | Attending: Obstetrics and Gynecology | Admitting: Obstetrics and Gynecology

## 2015-10-29 ENCOUNTER — Other Ambulatory Visit (HOSPITAL_COMMUNITY): Payer: Self-pay | Admitting: Obstetrics and Gynecology

## 2015-10-29 DIAGNOSIS — O42912 Preterm premature rupture of membranes, unspecified as to length of time between rupture and onset of labor, second trimester: Secondary | ICD-10-CM | POA: Insufficient documentation

## 2015-10-29 DIAGNOSIS — O3412 Maternal care for benign tumor of corpus uteri, second trimester: Secondary | ICD-10-CM

## 2015-10-29 DIAGNOSIS — Z3A15 15 weeks gestation of pregnancy: Secondary | ICD-10-CM

## 2015-10-29 DIAGNOSIS — D259 Leiomyoma of uterus, unspecified: Secondary | ICD-10-CM

## 2015-10-29 DIAGNOSIS — O42919 Preterm premature rupture of membranes, unspecified as to length of time between rupture and onset of labor, unspecified trimester: Secondary | ICD-10-CM

## 2015-10-29 DIAGNOSIS — O4102X0 Oligohydramnios, second trimester, not applicable or unspecified: Secondary | ICD-10-CM

## 2015-10-29 DIAGNOSIS — Z36 Encounter for antenatal screening of mother: Secondary | ICD-10-CM | POA: Diagnosis not present

## 2015-10-29 NOTE — Consult Note (Signed)
Maternal Fetal Medicine Consultation  Requesting Provider(s): Delsa Bern, MD  Reason for consultation: Midtrimester PROM  HPI: Erica Roth is a 34 yo G1P0, EDD 04/17/2016 who is currently at 15w 4d seen for consultation due to midtrimester PROM.  The patient reports leakage of fluid since [redacted] weeks gestation.  She was recently seen in the MAU for amnisure testing, but at the time she was grossly ruptures and the procedure was not performed.  She was previously regarding the guarded prognosis for the fetus and declined termination of pregnancy.  She denies vaginal bleeding, cramping, fevers or chills.  Her pregnancy course has otherwise been uncomplicated.  OB History: OB History    Gravida Para Term Preterm AB TAB SAB Ectopic Multiple Living   1               PMH:  Past Medical History  Diagnosis Date  . Fibroid     PSH:  Past Surgical History  Procedure Laterality Date  . No past surgeries     Meds:  Current Outpatient Prescriptions on File Prior to Encounter  Medication Sig Dispense Refill  . Prenatal Vit-Fe Fumarate-FA (PRENATAL MULTIVITAMIN) TABS tablet Take 1 tablet by mouth daily at 12 noon.     No current facility-administered medications on file prior to encounter.    Allergies: No Known Allergies   FH: Denies family history of birth defects or hereditary disorders  Soc:  Social History   Social History  . Marital Status: Married    Spouse Name: N/A  . Number of Children: N/A  . Years of Education: N/A   Occupational History  . Not on file.   Social History Main Topics  . Smoking status: Never Smoker   . Smokeless tobacco: Not on file  . Alcohol Use: No  . Drug Use: No  . Sexual Activity: Yes    Birth Control/ Protection: None   Other Topics Concern  . Not on file   Social History Narrative    Review of Systems: no vaginal bleeding or cramping/contractions, no LOF, no nausea/vomiting. All other systems reviewed and are negative.  PE:    Filed Vitals:   10/29/15 0932  BP: 117/77  Pulse: 91    GEN: well-appearing female ABD: gravid, NT  Ultrasound:  Single IUP at 15w 4d PROM at [redacted] weeks gestation Severe oligohydramnios / anhydramnios noted Limited views of the fetal anatomy obtained due to oligohydramnios Anterior placenta without previa Multiple uterine myomas noted as described above  Labs: CBC    Component Value Date/Time   WBC 7.6 09/18/2015 1749   RBC 4.24 09/18/2015 1749   HGB 12.9 10/08/2015 0216   HCT 38.0 10/08/2015 0216   PLT 214 09/18/2015 1749   MCV 87.3 09/18/2015 1749   MCH 30.0 09/18/2015 1749   MCHC 34.3 09/18/2015 1749   RDW 13.3 09/18/2015 1749   LYMPHSABS 2.4 09/18/2015 1749   MONOABS 0.5 09/18/2015 1749   EOSABS 0.2 09/18/2015 1749   BASOSABS 0.0 09/18/2015 1749     A/P: 1) IUP at 15w 3d  2) Midtrimester PROM - Had long discussion the patient and her husband regarding the guarded prognosis for the fetus.  We reviewed the risk of preterm / previable delivery (Among patients who do not deliver within the first 24 hours, the mean latency is 10-14 days), risk of chorioamnionitis (most reviews report an average of 30-50%), placental abruption, cord prolapse, and stillbirth.  We also reviewed the risk of pulmonary hypoplasia and the risk that  the newborn may have adequate lung development even if she was able to deliver at a gestational age where the newborn could potentially survive.  We also discussed the need to move toward delivery is she developed any signs or symptoms or intrauterine infection due to life-threatening risks (sepsis, etc) should this develop.  We briefly discussed the option of pregnancy termination - the couple is not interested in pursuing this option.    Recommendations: 1) Recommend at least weekly outpatient visits 2) Patient was instructed to continue to check her temperatures at home at least daily.  To be seen in clinic or in MAU if temperatures > 100 degree F or  other signs of infection (abdominal pain) or bleeding 3) My practice is to check weekly CBCs to screen for possible infection (leukocytosis) 4) Recommend follow up with MFM in 3 weeks to reevaluate fetal anatomy 5) If pregnancy remains viable, recommend a course of betamethasone at approximately 22.5 weeks and hospitalization by 23 weeks if the family desires aggressive intervention at that gestational age.  Would start latency antibiotics and NICU consultation at the time of admission.   Thank you for the opportunity to be a part of the care of Erica Roth. Please contact our office if we can be of further assistance.   I spent approximately 30 minutes with this patient with over 50% of time spent in face-to-face counseling.  Benjaman Lobe, MD Maternal Fetal Medicine

## 2015-11-19 ENCOUNTER — Other Ambulatory Visit (HOSPITAL_COMMUNITY): Payer: Self-pay | Admitting: Maternal and Fetal Medicine

## 2015-11-19 ENCOUNTER — Ambulatory Visit (HOSPITAL_COMMUNITY)
Admission: RE | Admit: 2015-11-19 | Discharge: 2015-11-19 | Disposition: A | Discharge status: Home / Self Care | Payer: Medicaid Other | Source: Ambulatory Visit | Attending: Obstetrics and Gynecology | Admitting: Obstetrics and Gynecology

## 2015-11-19 ENCOUNTER — Encounter (HOSPITAL_COMMUNITY): Payer: Self-pay

## 2015-11-19 DIAGNOSIS — IMO0002 Reserved for concepts with insufficient information to code with codable children: Secondary | ICD-10-CM

## 2015-11-19 DIAGNOSIS — Z3A18 18 weeks gestation of pregnancy: Secondary | ICD-10-CM | POA: Diagnosis not present

## 2015-11-19 DIAGNOSIS — D259 Leiomyoma of uterus, unspecified: Secondary | ICD-10-CM

## 2015-11-19 DIAGNOSIS — O42919 Preterm premature rupture of membranes, unspecified as to length of time between rupture and onset of labor, unspecified trimester: Secondary | ICD-10-CM

## 2015-11-19 DIAGNOSIS — O3412 Maternal care for benign tumor of corpus uteri, second trimester: Secondary | ICD-10-CM

## 2015-11-19 DIAGNOSIS — O4102X0 Oligohydramnios, second trimester, not applicable or unspecified: Secondary | ICD-10-CM

## 2015-11-19 DIAGNOSIS — O4290 Premature rupture of membranes, unspecified as to length of time between rupture and onset of labor, unspecified weeks of gestation: Secondary | ICD-10-CM | POA: Diagnosis not present

## 2015-11-19 DIAGNOSIS — Z36 Encounter for antenatal screening of mother: Secondary | ICD-10-CM | POA: Diagnosis not present

## 2015-11-19 DIAGNOSIS — O4100X0 Oligohydramnios, unspecified trimester, not applicable or unspecified: Secondary | ICD-10-CM | POA: Insufficient documentation

## 2015-11-19 DIAGNOSIS — Z0489 Encounter for examination and observation for other specified reasons: Secondary | ICD-10-CM

## 2015-11-24 NOTE — L&D Delivery Note (Signed)
Final Labor Progress Note At 1315, called to room to evaluate prolong decel.  VE 9/90/0.  FSE placed  Vaginal Delivery Note The pt utilized an epidural as pain management.   PPROM at 13 weeks, clear.  GBS is negative.  Cervical dilation was complete at  1351.  NICHD Category 2.    Pushing with guidance began at 1353.   After 1 hour(s) and  22 minutes of pushing the head, shoulders and the body of a viable female infant "[name]" delivered spontaneously with maternal effort in the ROA position at 1513.   Loose  x 1 and body cord x1 infant somersaulted thru without difficulty.   Infant with flaccid tone, no grimace, dusky color.  The cord was clamped, cut and the infant was immediately handed to NICU team arrived including Dr. Jonetta Osgood. Cord clamped and cut again for cord gas and blood collection.   Spontaneous delivery of a intact placenta with a 3 vessel cord via [place] at 1528.   Episiotomy: None   The vulva, perineum, vaginal vault, rectum and cervix were inspected and revealed a 2nd degree perineal and vaginal lacteration, repaired using a 3-0 vicryl on a CT needle.  Patient tolerated repair well.   Postpartum pitocin as ordered.  Fundus firm, lochia minimum, bleeding under control.   EBL 50, Pt hemodynamically stable.   Sponge, laps and needle count correct and verified with the primary care nurse.  Attending MD available at all times.    Routine postpartum orders   Mother desires no contraception  Mom plans to breastfeed    Placenta to pathology: YES     Cord Gases sent to lab: YES Cord blood sent to lab: YES   APGARS:  Pending NICU imput Weight:. 4lb 0.9oz     Both mom and baby were left in stable condition, baby skin to skin.      Erica Roth, CNM, MSN 03/09/2016. 1:39 PM

## 2015-11-26 ENCOUNTER — Telehealth (HOSPITAL_COMMUNITY): Payer: Self-pay | Admitting: *Deleted

## 2015-11-26 NOTE — Telephone Encounter (Signed)
Language line intepreter #[phone] used to leave message for patient to come to maternal fetal care for NICU consult before appointment on January 3rd at 315.  [place] appointment is at 4pm.

## 2015-12-17 ENCOUNTER — Ambulatory Visit (HOSPITAL_COMMUNITY)
Admission: RE | Admit: 2015-12-17 | Discharge: 2015-12-17 | Disposition: A | Discharge status: Home / Self Care | Payer: Medicaid Other | Source: Ambulatory Visit | Attending: Obstetrics and Gynecology | Admitting: Obstetrics and Gynecology

## 2015-12-17 ENCOUNTER — Other Ambulatory Visit (HOSPITAL_COMMUNITY): Payer: Self-pay | Admitting: Maternal and Fetal Medicine

## 2015-12-17 ENCOUNTER — Encounter (HOSPITAL_COMMUNITY): Payer: Self-pay

## 2015-12-17 DIAGNOSIS — D259 Leiomyoma of uterus, unspecified: Secondary | ICD-10-CM | POA: Insufficient documentation

## 2015-12-17 DIAGNOSIS — IMO0002 Reserved for concepts with insufficient information to code with codable children: Secondary | ICD-10-CM

## 2015-12-17 DIAGNOSIS — Z3A22 22 weeks gestation of pregnancy: Secondary | ICD-10-CM | POA: Diagnosis not present

## 2015-12-17 DIAGNOSIS — O3412 Maternal care for benign tumor of corpus uteri, second trimester: Secondary | ICD-10-CM

## 2015-12-17 DIAGNOSIS — O4102X0 Oligohydramnios, second trimester, not applicable or unspecified: Secondary | ICD-10-CM | POA: Diagnosis not present

## 2015-12-17 DIAGNOSIS — Z36 Encounter for antenatal screening of mother: Secondary | ICD-10-CM | POA: Diagnosis not present

## 2015-12-17 DIAGNOSIS — Z0489 Encounter for examination and observation for other specified reasons: Secondary | ICD-10-CM

## 2015-12-17 DIAGNOSIS — O42919 Preterm premature rupture of membranes, unspecified as to length of time between rupture and onset of labor, unspecified trimester: Secondary | ICD-10-CM

## 2015-12-17 NOTE — Consult Note (Signed)
[facility]  Prenatal Consult       12/17/2015  5:10 PM   I was asked by Dr. Venetia Maxon to consult on this patient for possible preterm delivery.  I had the pleasure of meeting with Erica Roth and her husband today.  She is a 35 y/o G1P0 at 80 and 4/[redacted] weeks gestation.  Her pregnancy has been complicated by ROM at [redacted] weeks gestation.  She was offered termination of the pregnancy at that time but declined.  Her AFI has remained low since ROM at 13 weeks, though there is still some fluid and the fetus is growing as expected (EFW 435g today, gender still undetermined).   I explained that we do not routinely offer resuscitation to infants less than [redacted] weeks gestation because the chance of survival is so low.  I also explained that we would universally recommend resuscitating a typical infant >[redacted] weeks gestation because the chance of survival with a good outcome is so high.  I explained that their infant would not be typical given the history of PPROM, and that lung maturity is dependent on the presence of amniotic fluid.  They understand that the effects of the ROM on the lungs will not be fully evident until the infant is born, and that even at higher gestational ages, the lungs may not be mature enough for survival.    I explained that the neonatal intensive care team would be present for the delivery and outlined the likely delivery room course for this baby including routine resuscitation and NRP-guided approaches to the treatment of respiratory distress. We discussed other common problems associated with prematurity including respiratory distress syndrome/CLD, apnea, feeding issues, temperature regulation, and infection risk.  We briefly discussed IVH/PVL, ROP, and NEC.    We discussed the average length of stay but I noted that the actual LOS would depend on the severity of problems encountered and response to treatments.  We discussed visitation policies and the resources available  while her child is in the hospital.  We discussed the importance of good nutrition and various methods of providing nutrition (parenteral hyperalimentation, gavage feedings and/or oral feeding). We discussed the benefits of human milk. I encouraged breast feeding and pumping soon after birth and outlined resources that are available to support breast feeding. Erica Roth does intent to provide breast milk.    Finally, given all this information, we discussed possible approaches that parents choose in cases where survival is in question: 1) comfort care 2) initial care, if medically possible, to assess response to therapies, or 3) provision of aggressive, high technology care until such care is deemed futile.  At this time, should her infant be born extremely prematurely, these parents wish to initiate intensive case unless it is clear that the response to treatment is not promising or is accompanied by significant complications.  I emphasized the importance of clear, honest, and open communication and indicated that we would involve them in decision making.   Erica Roth will be given a BMZ course starting tomorrow (at 48 and 5/7) weeks with plans to admit to the antepartum unit at 23 weeks.    Thank you for involving [place] in the care of this patient. A member of our team will be available should the family have additional questions.  Time for consultation approximately 45 minutes.   _____________________ Electronically Signed By: Clinton Gallant, MD Neonatologist

## 2015-12-19 ENCOUNTER — Encounter (HOSPITAL_COMMUNITY): Payer: Self-pay | Admitting: *Deleted

## 2015-12-19 ENCOUNTER — Inpatient Hospital Stay (HOSPITAL_COMMUNITY)
Admission: AD | Admit: 2015-12-19 | Discharge: 2016-02-22 | DRG: 781 | Disposition: A | Discharge status: Home / Self Care | Payer: Medicaid Other | Source: Ambulatory Visit | Attending: Obstetrics and Gynecology | Admitting: Obstetrics and Gynecology

## 2015-12-19 DIAGNOSIS — K5900 Constipation, unspecified: Secondary | ICD-10-CM | POA: Diagnosis present

## 2015-12-19 DIAGNOSIS — O368390 Maternal care for abnormalities of the fetal heart rate or rhythm, unspecified trimester, not applicable or unspecified: Secondary | ICD-10-CM

## 2015-12-19 DIAGNOSIS — D219 Benign neoplasm of connective and other soft tissue, unspecified: Secondary | ICD-10-CM | POA: Diagnosis present

## 2015-12-19 DIAGNOSIS — Z3A22 22 weeks gestation of pregnancy: Secondary | ICD-10-CM

## 2015-12-19 DIAGNOSIS — O42912 Preterm premature rupture of membranes, unspecified as to length of time between rupture and onset of labor, second trimester: Secondary | ICD-10-CM | POA: Diagnosis present

## 2015-12-19 DIAGNOSIS — O359XX0 Maternal care for (suspected) fetal abnormality and damage, unspecified, not applicable or unspecified: Secondary | ICD-10-CM

## 2015-12-19 DIAGNOSIS — O368190 Decreased fetal movements, unspecified trimester, not applicable or unspecified: Secondary | ICD-10-CM

## 2015-12-19 DIAGNOSIS — O4200 Premature rupture of membranes, onset of labor within 24 hours of rupture, unspecified weeks of gestation: Secondary | ICD-10-CM

## 2015-12-19 DIAGNOSIS — O4100X0 Oligohydramnios, unspecified trimester, not applicable or unspecified: Secondary | ICD-10-CM

## 2015-12-19 DIAGNOSIS — D259 Leiomyoma of uterus, unspecified: Secondary | ICD-10-CM

## 2015-12-19 DIAGNOSIS — O3412 Maternal care for benign tumor of corpus uteri, second trimester: Secondary | ICD-10-CM | POA: Diagnosis present

## 2015-12-19 DIAGNOSIS — O42919 Preterm premature rupture of membranes, unspecified as to length of time between rupture and onset of labor, unspecified trimester: Secondary | ICD-10-CM | POA: Diagnosis present

## 2015-12-19 DIAGNOSIS — O358XX0 Maternal care for other (suspected) fetal abnormality and damage, not applicable or unspecified: Secondary | ICD-10-CM | POA: Diagnosis present

## 2015-12-19 DIAGNOSIS — O4290 Premature rupture of membranes, unspecified as to length of time between rupture and onset of labor, unspecified weeks of gestation: Secondary | ICD-10-CM

## 2015-12-19 DIAGNOSIS — K219 Gastro-esophageal reflux disease without esophagitis: Secondary | ICD-10-CM | POA: Diagnosis present

## 2015-12-19 DIAGNOSIS — O368130 Decreased fetal movements, third trimester, not applicable or unspecified: Secondary | ICD-10-CM

## 2015-12-19 DIAGNOSIS — Z3A30 30 weeks gestation of pregnancy: Secondary | ICD-10-CM

## 2015-12-19 DIAGNOSIS — O4210 Premature rupture of membranes, onset of labor more than 24 hours following rupture, unspecified weeks of gestation: Secondary | ICD-10-CM

## 2015-12-19 DIAGNOSIS — O99613 Diseases of the digestive system complicating pregnancy, third trimester: Secondary | ICD-10-CM | POA: Diagnosis present

## 2015-12-19 DIAGNOSIS — Z3A32 32 weeks gestation of pregnancy: Secondary | ICD-10-CM

## 2015-12-19 DIAGNOSIS — Z3A28 28 weeks gestation of pregnancy: Secondary | ICD-10-CM

## 2015-12-19 DIAGNOSIS — O3413 Maternal care for benign tumor of corpus uteri, third trimester: Secondary | ICD-10-CM

## 2015-12-19 LAB — TYPE AND SCREEN
ABO/RH(D): O POS
ANTIBODY SCREEN: NEGATIVE

## 2015-12-19 LAB — CBC
HCT: 34.6 % — ABNORMAL LOW (ref 36.0–46.0)
HEMOGLOBIN: 12.1 g/dL (ref 12.0–15.0)
MCH: 30.7 pg (ref 26.0–34.0)
MCHC: 35 g/dL (ref 30.0–36.0)
MCV: 87.8 fL (ref 78.0–100.0)
PLATELETS: 222 10*3/uL (ref 150–400)
RBC: 3.94 MIL/uL (ref 3.87–5.11)
RDW: 13.7 % (ref 11.5–15.5)
WBC: 7.8 10*3/uL (ref 4.0–10.5)

## 2015-12-19 MED ORDER — SODIUM CHLORIDE 0.9 % IV SOLN
2.0000 g | Freq: Four times a day (QID) | INTRAVENOUS | Status: AC
  Administered 2015-12-19 – 2015-12-21 (×8): 2 g via INTRAVENOUS
  Filled 2015-12-19 (×10): qty 2000

## 2015-12-19 MED ORDER — PRENATAL MULTIVITAMIN CH
1.0000 | ORAL_TABLET | Freq: Every day | ORAL | Status: DC
  Administered 2015-12-20 – 2016-02-22 (×64): 1 via ORAL
  Filled 2015-12-19 (×66): qty 1

## 2015-12-19 MED ORDER — BETAMETHASONE SOD PHOS & ACET 6 (3-3) MG/ML IJ SUSP
12.0000 mg | INTRAMUSCULAR | Status: AC
  Administered 2015-12-19 – 2015-12-20 (×2): 12 mg via INTRAMUSCULAR
  Filled 2015-12-19 (×2): qty 2

## 2015-12-19 MED ORDER — AZITHROMYCIN 250 MG PO TABS
1000.0000 mg | ORAL_TABLET | Freq: Once | ORAL | Status: AC
Start: 2015-12-19 — End: 2015-12-19
  Administered 2015-12-19: 1000 mg via ORAL
  Filled 2015-12-19: qty 4

## 2015-12-19 MED ORDER — AZITHROMYCIN 250 MG PO TABS
1000.0000 mg | ORAL_TABLET | Freq: Once | ORAL | Status: AC
  Administered 2015-12-23: 1000 mg via ORAL
  Filled 2015-12-19: qty 4

## 2015-12-19 MED ORDER — DOCUSATE SODIUM 100 MG PO CAPS: 100.0000 mg | ORAL_CAPSULE | Freq: Every day | ORAL | Status: DC

## 2015-12-19 MED ORDER — ACETAMINOPHEN 325 MG PO TABS
650.0000 mg | ORAL_TABLET | ORAL | Status: DC | PRN
  Administered 2016-01-05 – 2016-02-08 (×3): 650 mg via ORAL
  Filled 2015-12-19 (×3): qty 2

## 2015-12-19 MED ORDER — AMOXICILLIN 500 MG PO CAPS
500.0000 mg | ORAL_CAPSULE | Freq: Three times a day (TID) | ORAL | Status: AC
  Administered 2015-12-21 – 2015-12-26 (×15): 500 mg via ORAL
  Filled 2015-12-19 (×15): qty 1

## 2015-12-19 MED ORDER — CALCIUM CARBONATE ANTACID 500 MG PO CHEW
2.0000 | CHEWABLE_TABLET | ORAL | Status: DC | PRN
Start: 2015-12-19 — End: 2016-02-22
  Administered 2015-12-20 – 2016-02-19 (×5): 400 mg via ORAL
  Filled 2015-12-19 (×5): qty 2

## 2015-12-19 MED ORDER — ZOLPIDEM TARTRATE 5 MG PO TABS
5.0000 mg | ORAL_TABLET | Freq: Every evening | ORAL | Status: DC | PRN
  Administered 2015-12-21 – 2016-01-10 (×3): 5 mg via ORAL
  Filled 2015-12-19 (×3): qty 1

## 2015-12-19 MED ORDER — DOCUSATE SODIUM 100 MG PO CAPS
100.0000 mg | ORAL_CAPSULE | Freq: Two times a day (BID) | ORAL | Status: DC
  Administered 2015-12-19 – 2016-02-22 (×55): 100 mg via ORAL
  Filled 2015-12-19 (×76): qty 1

## 2015-12-19 MED ORDER — LACTATED RINGERS IV SOLN
INTRAVENOUS | Status: DC
  Administered 2015-12-19 – 2015-12-20 (×2): via INTRAVENOUS

## 2015-12-19 MED ORDER — BETAMETHASONE SOD PHOS & ACET 6 (3-3) MG/ML IJ SUSP: 12.0000 mg | Freq: Once | INTRAMUSCULAR | Status: DC

## 2015-12-19 NOTE — MAU Note (Signed)
[facility]. Water broke around 13 weeks.

## 2015-12-19 NOTE — H&P (Signed)
Erica Roth is a 35 y.o. female, G1P0 at 22.6 weeks, presenting for preterm premature rupture of membranes at 14wks.  Patient was followed in outpatient setting by MFM and returns today for latency antibiotics and steroids.  In room to meet acquaintance.  Patient reports constipation, but denies other GI concerns, urinary concerns, and pain.  Patient reports minimal vaginal leaking and denies vb.  Patient endorses fetal movement and states all her questions and concerns have been addressed.    Patient Active Problem List   Diagnosis Date Noted  . Preterm premature rupture of membranes 12/19/2015  . Oligohydramnios antepartum 12/19/2015  . Fibroids 10/26/2015  . Preterm premature rupture of membranes (PPROM) with unknown onset of labor 10/18/2015    History of present pregnancy: Patient entered care at 14 weeks.   EDC of 04/18/2016 was established by Definite LMP of 07/13/2015.   Anatomy scan: 22.4  weeks, with oligo  Additional [place] evaluations:   -[place] on 09/18/15 at [facility] ER: [place], 9 5/7 weeks, EDC 04/18/15, with multiple fibroids noted, predominently in the anterior corpus and fundus, with largest measuring 6.5 cm. One fibroid in the anterior LUS shows an area of cystic degeneration. -[place] at MAU 10/18/15: AFV 0.66, anterior fibroid 3.59 x 2.73 x 2.23, anterior LUS 7.87 x 7.01 x 5.87 -[place] at [facility] 10/24/15: AFV 1.9, fibroid 6.64 x 6.45 x 7.28. -22.4 wks  [place] MFM OB F/u 12/17/15: SIUP at 22.4wks, Midtrimester PROM, cephalic presentation, EFW 438g (25th%), somewhat limited views on fetal anatomy were noted due to oligohydramnios, no gross anomalies noted, a large anterior, lower uterine myoma is noted (5.9x6x5.2cm). Oligohydramnios noted- a single fluid pocket is noted that measures 2.28cm.  Significant prenatal events: PPROM at 13wks, AFebrile,  Last evaluation: Jan 19 in office by Dr. ND.  FHR 146, BP 114/72, Wt 199.5lbs  OB History    Gravida Para Term Preterm AB TAB SAB Ectopic Multiple Living   1              Past Medical History  Diagnosis Date  . Fibroid    Past Surgical History  Procedure Laterality Date  . No past surgeries     Family History: family history is not on file. Social History:  reports that she has never smoked. She does not have any smokeless tobacco history on file. She reports that she does not drink alcohol or use illicit drugs.   Prenatal Transfer Tool  Maternal Diabetes: No Genetic Screening: Normal Maternal Ultrasounds/Referrals: Abnormal:  Findings:   Other:   Oligo secondary to PPROM Fetal Ultrasounds or other Referrals:  None Maternal Substance Abuse:  No Significant Maternal Medications:  None Significant Maternal Lab Results: None    ROS: -VB, -Ctx, +FM, +LoF  No Known Allergies     Blood pressure 105/64, pulse 72, temperature 97.8 F (36.6 C), temperature source Oral, resp. rate 18, last menstrual period 07/12/2015.  Physical Exam: General: No distress Chest: HRRR, Lungs CTA-B Abd: Soft, NT, BSx4 Extremities: No edema Skin: Warm, Dry  FHR:  145 bpm, Mod Var, -Decels, +Accels UCs: None graphed or palpated  Prenatal labs: ABO, Rh: --/--/O POS (10/26 1820) Antibody:  Negative Rubella:  Immune RPR:  NR  HBsAg:   Negative HIV:   NR GBS:  Unknown Sickle cell/Hgb electrophoresis: Normal Pap:  Normal 10/24/2015 GC:  Negative Chlamydia:  Negative Genetic screenings:  Glucola: Not Obtained Other:  Normal 1st Trimester Screen     Assessment IUP at 22.6wks Cat I FT pPROM AFebrile Constipation  Uterine Myoma Vertex by 22.4wk [place]  Plan: Admit to Antepartum per consult with Dr. Cletis Media Give colace BID until further notice MFM Recommendations: *See NICU consult *Pt desires intervention on fetal behalf at 23 wks - would like to be admitted on 12/19/15 for betamethasone and latency antibiotics *Serial [place] for growth every 4 weeks-Next one week of Feb 20th Latency Antibiotics: *Ampicillin 2g IV q6 hrs for 48 hrs *Followed  by Amoxicillin PO 500mg  QID for 5 days *Azithromycin PO 1g Now (Day 1) *Followed by Azithromycin PO 1 g on day 5  Maryann Conners MSN, CNM 12/19/2015 8:07 PM

## 2015-12-20 LAB — URINALYSIS, ROUTINE W REFLEX MICROSCOPIC
Bilirubin Urine: NEGATIVE
Glucose, UA: NEGATIVE mg/dL
Hgb urine dipstick: NEGATIVE
Ketones, ur: 15 mg/dL — AB
Leukocytes, UA: NEGATIVE
Nitrite: NEGATIVE
Protein, ur: NEGATIVE mg/dL
Specific Gravity, Urine: 1.01 (ref 1.005–1.030)
pH: 7 (ref 5.0–8.0)

## 2015-12-20 LAB — ABO/RH: ABO/RH(D): O POS

## 2015-12-20 MED ORDER — FAMOTIDINE 20 MG PO TABS
20.0000 mg | ORAL_TABLET | Freq: Two times a day (BID) | ORAL | Status: DC | PRN
  Administered 2015-12-20 – 2016-02-20 (×47): 20 mg via ORAL
  Filled 2015-12-20 (×50): qty 1

## 2015-12-20 NOTE — Progress Notes (Addendum)
Hospital day # 1 pregnancy at [redacted]w[redacted]d--PPROM at 13 weeks.  S:  Sleeping soundly--doing well per RN report       O: BP 122/64 mmHg  Pulse 79  Temp(Src) 98.2 F (36.8 C) (Oral)  Resp 16  Ht 5\' 9"  (1.753 m)  Wt 92.08 kg (203 lb)  BMI 29.96 kg/m2  LMP 07/12/2015      Fetal tracings:  Reassuring for EGA--d/c'd continuous EFM this am.      Contractions:   None               Labs:   Results for orders placed or performed during the hospital encounter of 12/19/15 (from the past 24 hour(s))  Type and screen [place]     Status: None   Collection Time: 12/19/15  5:37 PM  Result Value Ref Range   ABO/RH(D) O POS    Antibody Screen NEG    Sample Expiration 12/22/2015   CBC     Status: Abnormal   Collection Time: 12/19/15  5:37 PM  Result Value Ref Range   WBC 7.8 4.0 - 10.5 K/uL   RBC 3.94 3.87 - 5.11 MIL/uL   Hemoglobin 12.1 12.0 - 15.0 g/dL   HCT 34.6 (L) 36.0 - 46.0 %   MCV 87.8 78.0 - 100.0 fL   MCH 30.7 26.0 - 34.0 pg   MCHC 35.0 30.0 - 36.0 g/dL   RDW 13.7 11.5 - 15.5 %   Platelets 222 150 - 400 K/uL  ABO/Rh     Status: None   Collection Time: 12/19/15  5:37 PM  Result Value Ref Range   ABO/RH(D) O POS   Urinalysis, Routine w reflex microscopic (not at [facility])     Status: Abnormal   Collection Time: 12/20/15  4:20 AM  Result Value Ref Range   Color, Urine YELLOW YELLOW   APPearance CLEAR CLEAR   Specific Gravity, Urine 1.010 1.005 - 1.030   pH 7.0 5.0 - 8.0   Glucose, UA NEGATIVE NEGATIVE mg/dL   Hgb urine dipstick NEGATIVE NEGATIVE   Bilirubin Urine NEGATIVE NEGATIVE   Ketones, ur 15 (A) NEGATIVE mg/dL   Protein, ur NEGATIVE NEGATIVE mg/dL   Nitrite NEGATIVE NEGATIVE   Leukocytes, UA NEGATIVE NEGATIVE         Meds:  . [START ON 12/21/2015] amoxicillin  500 mg Oral 3 times per day  . ampicillin (OMNIPEN) IV  2 g Intravenous Q6H  . [START ON 12/23/2015] azithromycin  1,000 mg Oral Once  . betamethasone acetate-betamethasone sodium phosphate  12 mg  Intramuscular Q24 Hr x 2  . docusate sodium  100 mg Oral BID  . prenatal multivitamin  1 tablet Oral Q1200   On day 2 of 7 of ATB for latency. 1st dose betamethasone 12/19/15 at 1800  Had MFM consult as outpatient 10/29/15 and and NICU consult as outpatient 12/17/15.   A: [redacted]w[redacted]d with PPROM at 13 weeks     Large LUS fibroid     Stable  P: Continue current plan of care      Upcoming tests/treatments:   Serial [place] for growth q 4 weeks--next due week of 01/13/16      Complete latency antibiotics      NST TID            MDs will follow  Donnel Saxon CNM, [place] 12/20/2015 10:08 AM  Addendum: Dr. Alesia Richards and I in to see patient.  Sitting on side of bed. Patient doing well, denies pain,  contractions, or fever.  Reports clear leaking at times, feels fetal movement, and denies bleeding.  Abd/uterus gravid, soft, NT Ext WNL, no s/s DVT  Plan of care for continued hospitalization reviewed with patient. She seems to understand this issue and is agreeable with that plan. OK to saline lock IV.  Donnel Saxon, CNM 12/20/15 1:50p  I saw and examined patient at bedside and agree with above findings assessment and plan.  Dr. Alesia Richards.

## 2015-12-21 MED ORDER — SODIUM CHLORIDE 0.9% FLUSH
3.0000 mL | Freq: Two times a day (BID) | INTRAVENOUS | Status: DC
  Administered 2015-12-21 – 2015-12-22 (×2): 3 mL via INTRAVENOUS

## 2015-12-21 NOTE — Progress Notes (Addendum)
Hospital day # 2 pregnancy at [redacted]w[redacted]d--PPROM at 13 weeks.  S:  Doing well, no issues.  Sleeping at intervals.      Perception of contractions: None      Vaginal bleeding: None       Vaginal discharge:  No significant change  O: BP 101/72 mmHg  Pulse 79  Temp(Src) 97.4 F (36.3 C) (Oral)  Resp 18  Ht 5\' 9"  (1.753 m)  Wt 92.08 kg (203 lb)  BMI 29.96 kg/m2  LMP 07/12/2015      Fetal tracings:  Reassuring on TID tracings      Contractions:   None      Uterus non-tender      Extremities: no significant edema and no signs of DVT          Labs: Last CBC 12/19/15, WNL       Meds:  . amoxicillin  500 mg Oral 3 times per day  . ampicillin (OMNIPEN) IV  2 g Intravenous Q6H  . [START ON 12/23/2015] azithromycin  1,000 mg Oral Once  . docusate sodium  100 mg Oral BID  . prenatal multivitamin  1 tablet Oral Q1200   On day 3 of 7 of ATB for latency--one more IV Amp dose remaining. Completed betamethasone course last night at 1819  A: [redacted]w[redacted]d with PPROM from 13 weeks     LUS anterior fibroid     Stable  P: Continue current plan of care      Upcoming tests/treatments:   Serial [place] for growth q 4 weeks--next due week of 01/13/16 Complete latency antibiotics NST TID      MDs will follow  Donnel Saxon CNM, [place] 12/21/2015 10:48 AM   Agree with above. Dr. Alesia Richards.

## 2015-12-22 LAB — TYPE AND SCREEN
ABO/RH(D): O POS
ANTIBODY SCREEN: NEGATIVE

## 2015-12-22 NOTE — Progress Notes (Addendum)
Hospital day # 3 pregnancy at [redacted]w[redacted]d--PPROM at 13 weeks.  S:  Doing well--no issues or questions.  Requests d/c of saline lock.      Perception of contractions: None      Vaginal bleeding: None       Vaginal discharge:  Occasionally leaking small amount clear fluid  O: BP 109/60 mmHg  Pulse 70  Temp(Src) 98 F (36.7 C) (Oral)  Resp 16  Ht 5\' 9"  (1.753 m)  Wt 92.08 kg (203 lb)  BMI 29.96 kg/m2  LMP 07/12/2015      Fetal tracings: Reassuring for EGA, baseline 150-60, moderate variability       Contractions:   None      Uterus non-tender      Extremities: no significant edema and no signs of DVT          Labs:  Last CBC 12/19/15, WNL       Meds:  . amoxicillin  500 mg Oral 3 times per day  . [START ON 12/23/2015] azithromycin  1,000 mg Oral Once  . docusate sodium  100 mg Oral BID  . prenatal multivitamin  1 tablet Oral Q1200  . sodium chloride flush  3 mL Intravenous Q12H  On day 4 of 7 of ATB for latency--completed IV course yesterday  A: [redacted]w[redacted]d with PPROM at 13 weeks     LUS fibroid     Stable  P: Continue current plan of care      Upcoming tests/treatments:  Serial [place] for growth q 4 weeks--due week of 01/13/16 Complete latency antibiotics NST TID       MDs will follow  Donnel Saxon CNM, [place] 12/22/2015 12:00 PM   Seen and agreed Plan of care reviewed with patient and husband. Questions answered. IOL at 34 weeks planned.

## 2015-12-23 NOTE — Progress Notes (Addendum)
Hospital day # 4 pregnancy at [redacted]w[redacted]d--PPROM at 13 weeks.  S:  No issues, feels well.      Perception of contractions: None      Vaginal bleeding: None       Vaginal discharge:  no significant change  O: BP 106/43 mmHg  Pulse 66  Temp(Src) 98.1 F (36.7 C) (Oral)  Resp 16  Ht 5\' 9"  (1.753 m)  Wt 92.08 kg (203 lb)  BMI 29.96 kg/m2  SpO2 98%  LMP 07/12/2015      Fetal tracings:  Reassuring per EGA--baseline 145-155      Contractions:   None      Uterus gravid and non-tender      Extremities: no significant edema and no signs of DVT          Labs:  Last CBC 12/19/15       Meds:  . amoxicillin  500 mg Oral 3 times per day  . azithromycin  1,000 mg Oral Once  . docusate sodium  100 mg Oral BID  . prenatal multivitamin  1 tablet Oral Q1200  On day 5 of 7 of ATB for latency.  A: [redacted]w[redacted]d with PPROM at 13 weeks     Stable  P: Continue current plan of care      Upcoming tests/treatments:  Serial [place] for growth q 4 weeks--due week of 01/13/16 Complete latency antibiotics NST TID        MDs will follow  Donnel Saxon CNM, [place] 12/23/2015 11:58 AM   Seen and agreed

## 2015-12-24 NOTE — Progress Notes (Addendum)
Hospital day # 5 pregnancy at [redacted]w[redacted]d--PPROM at 13 weeks  S:  Perception of contractions: none      Vaginal bleeding: none now       Vaginal discharge:  no significant change  O: BP 99/60 mmHg  Pulse 64  Temp(Src) 97.9 F (36.6 C) (Oral)  Resp 16  Ht 5\' 9"  (1.753 m)  Wt 203 lb (92.08 kg)  BMI 29.96 kg/m2  SpO2 97%  LMP 07/12/2015      Fetal tracings:140, appropriate for at 23.[redacted] weeks GA as of 2300 on 12/23/2015      Contractions:  none       Uterus gravid and non-tender      Extremities: no significant edema and no signs of DVT          Labs:  No results found for this or any previous visit (from the past 24 hour(s)).       Meds:  Current facility-administered medications:  .  acetaminophen (TYLENOL) tablet 650 mg, 650 mg, Oral, Q4H PRN, Lavetta Nielsen, CNM .  amoxicillin (AMOXIL) capsule 500 mg, 500 mg, Oral, 3 times per day, Lavetta Nielsen, CNM, 500 mg at 12/24/15 0920 .  calcium carbonate (TUMS - dosed in mg elemental calcium) chewable tablet 400 mg of elemental calcium, 2 tablet, Oral, Q4H PRN, Lavetta Nielsen, CNM, 400 mg of elemental calcium at 12/20/15 1553 .  docusate sodium (COLACE) capsule 100 mg, 100 mg, Oral, BID, Gavin Pound, CNM, 100 mg at 12/23/15 2213 .  famotidine (PEPCID) tablet 20 mg, 20 mg, Oral, BID PRN, Donnel Saxon, CNM, 20 mg at 12/21/15 0924 .  prenatal multivitamin tablet 1 tablet, 1 tablet, Oral, Q1200, Lavetta Nielsen, CNM, 1 tablet at 12/23/15 1202 .  zolpidem (AMBIEN) tablet 5 mg, 5 mg, Oral, QHS PRN, Lavetta Nielsen, CNM, 5 mg at 12/21/15 2306   A: [redacted]w[redacted]d with PPROM at 13 weeks     stable     Fetal tracings:  140, appropriate for at 23.[redacted] weeks GA as of 2300 on 12/23/2015     Contractions: none     Uterus non-tender      Extremities: DTR 1+, no clonus, no edema     BMZ complete 11/29/2015     NST TID     ABX day 5/7  P: Continue current plan of care      Upcoming tests/treatments:    NST at 1000  Growth [place] Feb 20      MDs and MFM will follow      SCD while  in bed  Venus Standard, CNM, MSN 12/24/2015. 9:51 AM  Limited anatomy scan in the office on 12/17/15 with no anomalies but limited secondary to oligo. - AYR

## 2015-12-25 LAB — TYPE AND SCREEN
ABO/RH(D): O POS
Antibody Screen: NEGATIVE

## 2015-12-25 NOTE — Progress Notes (Addendum)
Hospital day # 6 pregnancy at [redacted]w[redacted]d--PPROM at 13 weeks.  S:  Doing well      Perception of contractions: none      Vaginal bleeding: none now       Vaginal discharge:  no significant change  O: BP 112/59 mmHg  Pulse 81  Temp(Src) 98 F (36.7 C) (Oral)  Resp 16  Ht 5\' 9"  (1.753 m)  Wt 89.132 kg (196 lb 8 oz)  BMI 29.00 kg/m2  SpO2 99%  LMP 07/12/2015      Fetal tracings:  Reassuring, BL  150 bm, appropriate for at 23.[redacted] weeks GA at 1333 on 12/25/15      Contractions:   none      Uterus gravid and non-tender      Extremities: no significant edema and no signs of DVT          Labs:   Results for orders placed or performed during the hospital encounter of 12/19/15 (from the past 72 hour(s))  Type and screen [place]     Status: None   Collection Time: 12/22/15  3:15 PM  Result Value Ref Range   ABO/RH(D) O POS    Antibody Screen NEG    Sample Expiration 12/25/2015          Meds:  . amoxicillin  500 mg Oral 3 times per day  . docusate sodium  100 mg Oral BID  . prenatal multivitamin  1 tablet Oral Q1200   A: [redacted]w[redacted]d with PPROM at 13 weeks     stable      NST (1333 on 12/25/15)  reassuring   BMZ complete 11/29/2015  NST TID  ABX day 6/7 P: Continue current plan of care      Complete latency ABX       Upcoming tests/treatments:   NST at 2000 Growth [place] Feb 20   SCD while in bed      MDs  And MFM will follow  [place], [place] 12/25/2015 1:30 PM   I saw and examined patient at bedside and agree with above findings, assessment and plan.  Dr. Alesia Richards.

## 2015-12-25 NOTE — Progress Notes (Signed)
Initial Nutrition Assessment  DOCUMENTATION CODES:   Obesity unspecified  INTERVENTION:  Regular Diet May order double protein portions, snacks TID and from retail  NUTRITION DIAGNOSIS:   Increased nutrient needs related to  (pregnancy and fetal growth requirements) as evidenced by  (23 weeks IUP).  GOAL:  Patient will meet greater than or equal to 90% of their needs  MONITOR:   Weight trends REASON FOR ASSESSMENT:  Antenatal   ASSESSMENT:  23 5/7 weeks with PPROM. pre-pregnancy weight 206 lbs, BMI 30.5. Pt experiencing weight loss, but Pt states appetite is good. Offered snack menu and encouraaged small frequent meals  Diet Order:  Diet regular Room service appropriate?: Yes; Fluid consistency:: Thin  Skin:  Reviewed, no issues  Height:   Ht Readings from Last 1 Encounters:  12/19/15 5\' 9"  (1.753 m)    Weight:   Wt Readings from Last 1 Encounters:  12/25/15 196 lb 8 oz (89.132 kg)    Ideal Body Weight:     BMI:  Body mass index is 29 kg/(m^2).  Estimated Nutritional Needs:   Kcal:  2200-2400  Protein:  98-108 g  Fluid:  2.5 L  EDUCATION NEEDS:   No education needs identified at this time  Cathlean Sauer.Fredderick Severance LDN Neonatal Nutrition Support Specialist/RD III Pager [phone]      Phone [phone]

## 2015-12-26 NOTE — Progress Notes (Signed)
Patient ID: Erica Roth, female   DOB: 1981/06/24, 35 y.o.   MRN: [id number] Pt without complaints.    BP 110/63 mmHg  Pulse 78  Temp(Src) 97.9 F (36.6 C) (Oral)  Resp 18  Ht 5\' 9"  (1.753 m)  Wt 196 lb 8 oz (89.132 kg)  BMI 29.00 kg/m2  SpO2 100%  LMP 07/12/2015  FHTS Baseline: 150 bpm  Toco none  Pt in NAD CV RRR Lungs CTAB abd  Gravid soft and NT GU no vb EXt no calf tenderness Results for orders placed or performed during the hospital encounter of 12/19/15 (from the past 72 hour(s))  Type and screen [place]     Status: None   Collection Time: 12/25/15  4:31 PM  Result Value Ref Range   ABO/RH(D) O POS    Antibody Screen NEG    Sample Expiration 12/28/2015     Assessment and Plan [redacted]w[redacted]d  PPROM  Pt stable  Will monitor

## 2015-12-26 NOTE — Progress Notes (Addendum)
Pt sleeping, will return later to assess.   Erica Roth 12/26/15  At 0947  Addendum:   Hospital day # 7 pregnancy at [redacted]w[redacted]d--PPROM at 13 weeks.  S:  Doing well, no complaints      Perception of contractions: none      Vaginal bleeding: none       Vaginal discharge:  no significant change  O: BP 108/60 mmHg  Pulse 83  Temp(Src) 98 F (36.7 C) (Oral)  Resp 18  Ht 5\' 9"  (1.753 m)  Wt 89.132 kg (196 lb 8 oz)  BMI 29.00 kg/m2  SpO2 100%  LMP 07/12/2015      Fetal tracings:  Reassuring, 150 bpm, moderate variability, appropriate for 23.[redacted] wks ega (12/26/15 at 1100)      Contractions:   none      Uterus gravid and non-tender      Extremities: extremities normal, atraumatic, no cyanosis or edema          Labs:    Results for orders placed or performed during the hospital encounter of 12/19/15 (from the past 72 hour(s))  Type and screen [place]     Status: None   Collection Time: 12/25/15  4:31 PM  Result Value Ref Range   ABO/RH(D) O POS    Antibody Screen NEG    Sample Expiration 12/28/2015          Meds:  . amoxicillin  500 mg Oral 3 times per day  . docusate sodium  100 mg Oral BID  . prenatal multivitamin  1 tablet Oral Q1200    A: [redacted]w[redacted]d with PPROM at 13 weeks     stable      NST (1100 on 12/26/15) reassuring/appropriate for ega   BMZ complete 11/29/2015  NST TID  ABX day 7/7    Recent nutritional assessment for pregnancy weight loss P: Continue current plan of care      Encourage regular and nutritionally varied diet      Complete latency ABX today  Upcoming tests/treatments:  NST at 2000 Growth [place] Feb 21   SCD while in bed  MDs And MFM will follow         [place], [place] 12/26/2015 10:59 AM

## 2015-12-27 LAB — CBC
HEMATOCRIT: 31.7 % — AB (ref 36.0–46.0)
HEMOGLOBIN: 10.8 g/dL — AB (ref 12.0–15.0)
MCH: 30.4 pg (ref 26.0–34.0)
MCHC: 34.1 g/dL (ref 30.0–36.0)
MCV: 89.3 fL (ref 78.0–100.0)
Platelets: 236 10*3/uL (ref 150–400)
RBC: 3.55 MIL/uL — ABNORMAL LOW (ref 3.87–5.11)
RDW: 13.6 % (ref 11.5–15.5)
WBC: 10.2 10*3/uL (ref 4.0–10.5)

## 2015-12-27 MED ORDER — BISACODYL 10 MG RE SUPP
10.0000 mg | Freq: Once | RECTAL | Status: DC
  Filled 2015-12-27: qty 1

## 2015-12-27 MED ORDER — POLYETHYLENE GLYCOL 3350 17 G PO PACK
17.0000 g | PACK | Freq: Every day | ORAL | Status: DC | PRN
  Administered 2015-12-27 – 2015-12-29 (×3): 17 g via ORAL
  Filled 2015-12-27 (×4): qty 1

## 2015-12-27 NOTE — Progress Notes (Signed)
Patient ID: Erica Roth, female   DOB: Jun 20, 1981, 35 y.o.   MRN: [id number] Pt without complaints.  OCC leakage of fluid. NO  VB.  Good FM  BP 105/89 mmHg  Pulse 79  Temp(Src) 97.9 F (36.6 C) (Oral)  Resp 16  Ht 5\' 9"  (1.753 m)  Wt 196 lb 8 oz (89.132 kg)  BMI 29.00 kg/m2  SpO2 100%  LMP 07/12/2015  FHTS Baseline: 140-50 bpm  Toco none  Pt in NAD CV RRR Lungs CTAB abd  Gravid soft and NT GU no vb EXt no calf tenderness Results for orders placed or performed during the hospital encounter of 12/19/15 (from the past 72 hour(s))  Type and screen [place]     Status: None   Collection Time: 12/25/15  4:31 PM  Result Value Ref Range   ABO/RH(D) O POS    Antibody Screen NEG    Sample Expiration 12/28/2015   CBC     Status: Abnormal   Collection Time: 12/27/15  5:23 AM  Result Value Ref Range   WBC 10.2 4.0 - 10.5 K/uL   RBC 3.55 (L) 3.87 - 5.11 MIL/uL   Hemoglobin 10.8 (L) 12.0 - 15.0 g/dL   HCT 31.7 (L) 36.0 - 46.0 %   MCV 89.3 78.0 - 100.0 fL   MCH 30.4 26.0 - 34.0 pg   MCHC 34.1 30.0 - 36.0 g/dL   RDW 13.6 11.5 - 15.5 %   Platelets 236 150 - 400 K/uL    Assessment and Plan [redacted]w[redacted]d  PPROM pt stable No signs or symptoms of infection Continue current care

## 2015-12-28 LAB — TYPE AND SCREEN
ABO/RH(D): O POS
Antibody Screen: NEGATIVE

## 2015-12-28 NOTE — Progress Notes (Signed)
Subjective: Chart and strip review  Objective: BP 110/66 mmHg  Pulse 93  Temp(Src) 98.5 F (36.9 C) (Oral)  Resp 18  Ht 5\' 9"  (1.753 m)  Wt 89.132 kg (196 lb 8 oz)  BMI 29.00 kg/m2  SpO2 100%  LMP 07/12/2015     FHT:   Reassuring,BL 150 bpm, moderate variability, +accels 10x10, appropriate for 24.1wls ega  (2/3/17at 2150) UC:   none   Assessment:  108w1d with PPROM at 13 weeks  stable  NST (2150 on 12/27/15) reassuring/appropriate for ega   BMZ complete 11/29/2015  NST TID     Latency ABX completed   Plan: Continue current plan of care  Encourage regular and nutritionally varied diet  Upcoming tests/treatments:  NST at 0600 Growth [place] Feb 21   SCD while in bed  MDs And MFM will follow  [place], [place] 12/28/2015, 12:14 AM

## 2015-12-28 NOTE — Progress Notes (Signed)
Hospital day # 9 pregnancy at [redacted]w[redacted]d--PPROM at 13 weeks.  S:  Perception of contractions: none      Vaginal bleeding: none now       Vaginal discharge:  no significant change  Pt request to walk outside of the building.    O: BP 112/72 mmHg  Pulse 80  Temp(Src) 97.3 F (36.3 C) (Oral)  Resp 17  Ht 5\' 9"  (1.753 m)  Wt 196 lb 8 oz (89.132 kg)  BMI 29.00 kg/m2  SpO2 100%  LMP 07/12/2015      Fetal tracings:fhr 150, appropriate for GA      Contractions:  none       Uterus gravid and non-tender      Extremities: no significant edema and no signs of DVT          Labs:  No results found for this or any previous visit (from the past 24 hour(s)).       Meds:  Current facility-administered medications:  .  acetaminophen (TYLENOL) tablet 650 mg, 650 mg, Oral, Q4H PRN, Lavetta Nielsen, CNM .  bisacodyl (DULCOLAX) suppository 10 mg, 10 mg, Rectal, Once, Lavetta Nielsen, CNM, 10 mg at 12/27/15 2245 .  calcium carbonate (TUMS - dosed in mg elemental calcium) chewable tablet 400 mg of elemental calcium, 2 tablet, Oral, Q4H PRN, Lavetta Nielsen, CNM, 400 mg of elemental calcium at 12/20/15 1553 .  docusate sodium (COLACE) capsule 100 mg, 100 mg, Oral, BID, Gavin Pound, CNM, 100 mg at 12/28/15 0905 .  famotidine (PEPCID) tablet 20 mg, 20 mg, Oral, BID PRN, Donnel Saxon, CNM, 20 mg at 12/26/15 1734 .  polyethylene glycol (MIRALAX / GLYCOLAX) packet 17 g, 17 g, Oral, Daily PRN, Crawford Givens, MD, 17 g at 12/28/15 0905 .  prenatal multivitamin tablet 1 tablet, 1 tablet, Oral, Q1200, Lavetta Nielsen, CNM, 1 tablet at 12/28/15 0905 .  zolpidem (AMBIEN) tablet 5 mg, 5 mg, Oral, QHS PRN, Lavetta Nielsen, CNM, 5 mg at 12/25/15 2340  A: [redacted]w[redacted]d with PPROM at 13 weeks     stable     Fetal tracings: appropriate for GA     Contractions: none     Uterus non-tender      Extremities: DTR 1+, no clonus, no edema  P: Continue current plan of care      Upcoming tests/treatments:  NST TID, [place] 2/21      MDs will follow  10 minunte wheel chair ride    [employer], CNM, MSN 12/28/2015. 10:11 AM

## 2015-12-28 NOTE — Progress Notes (Signed)
Patient ID: Erica Roth, female   DOB: [birth year], 35 y.o.   MRN: [id number] Pt without complaints.  Pt has LOF.   No VB.  Good FM  BP 115/63 mmHg  Pulse 81  Temp(Src) 98 F (36.7 C) (Oral)  Resp 16  Ht 5\' 9"  (1.753 m)  Wt 196 lb 8 oz (89.132 kg)  BMI 29.00 kg/m2  SpO2 100%  LMP 07/12/2015  FHTS Baseline: 150-160 bpm  Toco none  Pt in NAD CV RRR Lungs CTAB abd  Gravid soft and NT GU no vb EXt no calf tenderness Results for orders placed or performed during the hospital encounter of 12/19/15 (from the past 72 hour(s))  Type and screen [place]     Status: None   Collection Time: 12/25/15  4:31 PM  Result Value Ref Range   ABO/RH(D) O POS    Antibody Screen NEG    Sample Expiration 12/28/2015   CBC     Status: Abnormal   Collection Time: 12/27/15  5:23 AM  Result Value Ref Range   WBC 10.2 4.0 - 10.5 K/uL   RBC 3.55 (L) 3.87 - 5.11 MIL/uL   Hemoglobin 10.8 (L) 12.0 - 15.0 g/dL   HCT 31.7 (L) 36.0 - 46.0 %   MCV 89.3 78.0 - 100.0 fL   MCH 30.4 26.0 - 34.0 pg   MCHC 34.1 30.0 - 36.0 g/dL   RDW 13.6 11.5 - 15.5 %   Platelets 236 150 - 400 K/uL    Assessment and Plan [redacted]w[redacted]d  PPROM Pt stable continue current care

## 2015-12-29 NOTE — Progress Notes (Signed)
Hospital day # 10 pregnancy at [redacted]w[redacted]d--PPROM at 13weeks.  S:  Perception of contractions: none      Vaginal bleeding: none now       Vaginal discharge:  no significant change  O: BP 87/49 mmHg  Pulse 86  Temp(Src) 98.2 F (36.8 C) (Oral)  Resp 18  Ht 5\' 9"  (1.753 m)  Wt 196 lb 8 oz (89.132 kg)  BMI 29.00 kg/m2  SpO2 100%  LMP 07/12/2015      Fetal tracings:145      Contractions:  none       Uterus gravid and non-tender      Extremities: no significant edema and no signs of DVT          Labs:   Results for orders placed or performed during the hospital encounter of 12/19/15 (from the past 24 hour(s))  Type and screen [place]     Status: None   Collection Time: 12/28/15  4:56 PM  Result Value Ref Range   ABO/RH(D) O POS    Antibody Screen NEG    Sample Expiration 12/31/2015          Meds:  Current facility-administered medications:  .  acetaminophen (TYLENOL) tablet 650 mg, 650 mg, Oral, Q4H PRN, Lavetta Nielsen, CNM .  bisacodyl (DULCOLAX) suppository 10 mg, 10 mg, Rectal, Once, Lavetta Nielsen, CNM, 10 mg at 12/27/15 2245 .  calcium carbonate (TUMS - dosed in mg elemental calcium) chewable tablet 400 mg of elemental calcium, 2 tablet, Oral, Q4H PRN, Lavetta Nielsen, CNM, 400 mg of elemental calcium at 12/20/15 1553 .  docusate sodium (COLACE) capsule 100 mg, 100 mg, Oral, BID, Gavin Pound, CNM, 100 mg at 12/28/15 2153 .  famotidine (PEPCID) tablet 20 mg, 20 mg, Oral, BID PRN, Donnel Saxon, CNM, 20 mg at 12/26/15 1734 .  polyethylene glycol (MIRALAX / GLYCOLAX) packet 17 g, 17 g, Oral, Daily PRN, Crawford Givens, MD, 17 g at 12/28/15 0905 .  prenatal multivitamin tablet 1 tablet, 1 tablet, Oral, Q1200, Lavetta Nielsen, CNM, 1 tablet at 12/28/15 0905 .  zolpidem (AMBIEN) tablet 5 mg, 5 mg, Oral, QHS PRN, Lavetta Nielsen, CNM, 5 mg at 12/25/15 2340  A: [redacted]w[redacted]d with PPRO at 13  weeks     stable     Fetal tracings: 145, appropriate for 24 weeker     Contractions: none  Uterus non-tender      Extremities: DTR 1+, no clonus, no edema   P: Continue current plan of care      Upcoming tests/treatments:  NST TID, [place] 2/21      MDs will follow    Levelle Edelen, CNM, MSN 12/29/2015. 9:48 AM

## 2015-12-30 NOTE — Progress Notes (Signed)
[name] [id number]  Subjective: Strip and Chart Reviewed.  Objective:  Filed Vitals:   12/29/15 1145 12/29/15 1822 12/29/15 2000 12/29/15 2229  BP: 107/60 110/63  111/65  Pulse: 73 81  75  Temp: 97.9 F (36.6 C) 98.1 F (36.7 C) 97.5 F (36.4 C)   TempSrc: Oral Oral Oral   Resp: 17 17  16   Height:      Weight:      SpO2:    100%    FHR: 155 bpm, Mod Var, + Occasional Variable Decels, +Accels UC: None Graphed  Assessment: IUP at [redacted]w[redacted]d Cat I FT Overall pPROM at 13wks  Plan: -Continue present Dory Horn, CNM 12/30/2015 1:08 AM

## 2015-12-30 NOTE — Progress Notes (Addendum)
  Subjective:  Patient reports no complaints.  Continues with small leakage of fluid.    Objective: I have reviewed patient's vital signs and NST and TOCO.  General: alert, cooperative and no distress Abdomen: Soft, nontender, gravid  NST: category 1 TOCO: no contractions.    Assessment/Plan: 24 weeks 4 days EGA pregnancy  PPROM at [redacted] weeks EGA, status post latency antibiotics, s/p steroids  Stable  Continue with fetal monitoring  Continue with inpatient current care     LOS: 11 days    [facility] 12/30/2015, 6:33 PM

## 2015-12-31 LAB — TYPE AND SCREEN
ABO/RH(D): O POS
Antibody Screen: NEGATIVE

## 2015-12-31 NOTE — Progress Notes (Signed)
Hospital day # 12 pregnancy at [redacted]w[redacted]d--PPROM at 13 weeks  S:  Very sleepy, no complaints.      Perception of contractions: None      Vaginal bleeding: None      Vaginal discharge:  None  O: BP 119/64 mmHg  Pulse 80  Temp(Src) 97.6 F (36.4 C) (Oral)  Resp 16  Ht 5\' 9"  (1.753 m)  Wt 89.132 kg (196 lb 8 oz)  BMI 29.00 kg/m2  SpO2 100%  LMP 07/12/2015      Fetal tracings:  Reassuring for EGA, no decels.      Contractions:   None      Uterus non-tender      Extremities: no significant edema and no signs of DVT          Labs:  Last CBC 12/27/15, Hgb 10.8; T&S done today       Meds:  . bisacodyl  10 mg Rectal Once  . docusate sodium  100 mg Oral BID  . prenatal multivitamin  1 tablet Oral Q1200    A: [redacted]w[redacted]d with PPROM at 13 weeks     stable  P: Continue current plan of care      Upcoming tests/treatments:  NST TID; [place] 01/14/16      MDs will follow  Donnel Saxon CNM, [place] 12/31/2015 11:28 AM

## 2016-01-01 MED ORDER — BISACODYL 10 MG RE SUPP
10.0000 mg | Freq: Once | RECTAL | Status: AC
  Administered 2016-01-01: 10 mg via RECTAL
  Filled 2016-01-01: qty 1

## 2016-01-01 NOTE — Progress Notes (Signed)
Hospital day # 13 pregnancy at [redacted]w[redacted]d : PPROM at 8 weeks  S: well, reports good fetal activity      Contractions:none      Vaginal bleeding:none now       Vaginal discharge: no significant change  O: BP 113/58 mmHg  Pulse 76  Temp(Src) 98.1 F (36.7 C) (Oral)  Resp 20  Ht 5\' 9"  (1.753 m)  Wt 199 lb (90.266 kg)  BMI 29.37 kg/m2  SpO2 100%  LMP 07/12/2015      Fetal tracings:reviewed and reassuring      Uterus gravid and non-tender      Extremities: no significant edema and no signs of DVT  A: [redacted]w[redacted]d with PPROM at 14 weeks s/p BMZ and completed latency antibiotic course     No evidence of chorioamnionitis     unchanged  P: continue current plan of care      IOL at 34 weeks  Ysela Hettinger A  MD 01/01/2016 10:44 AM

## 2016-01-02 NOTE — Progress Notes (Signed)
Hospital day # 14 pregnancy at [redacted]w[redacted]d--PPROM at 13 weeks.  S:  Doing well--no complaints.  Had reported mild vulvar itching x 1 yesterday, no recurrence, no d/c.      Perception of contractions: None      Vaginal bleeding: None       Vaginal discharge:  None  O: BP 110/68 mmHg  Pulse 77  Temp(Src) 97.9 F (36.6 C) (Oral)  Resp 18  Ht 5\' 9"  (1.753 m)  Wt 90.266 kg (199 lb)  BMI 29.37 kg/m2  SpO2 100%  LMP 07/12/2015      Fetal tracings:  Reassuring on TID NST      Contractions: None        Uterus non-tender      Extremities: no significant edema and no signs of DVT          Labs:  Last T&S 12/31/15       Meds:  . bisacodyl  10 mg Rectal Once  . docusate sodium  100 mg Oral BID  . prenatal multivitamin  1 tablet Oral Q1200  Received Dulcolax supp yesterday, with good results.  A: [redacted]w[redacted]d with PPROM at 13 weeks.     Stable  P: Continue current plan of care      Upcoming tests/treatments:  NST TID; [place] 01/14/16      MDs will follow  Donnel Saxon CNM, [place] 01/02/2016 8:39 AM

## 2016-01-03 LAB — TYPE AND SCREEN
ABO/RH(D): O POS
ANTIBODY SCREEN: NEGATIVE

## 2016-01-03 NOTE — Progress Notes (Signed)
Hospital day # 15 pregnancy at [redacted]w[redacted]d--PPROM at 13 weeks.  S:  Pt sleeping, easily roused      Perception of contractions: none, per RN report      Vaginal bleeding: none - per RN report       Vaginal discharge:  no significant change  O: BP 119/68 mmHg  Pulse 84  Temp(Src) 97.9 F (36.6 C) (Oral)  Resp 18  Ht 5\' 9"  (1.753 m)  Wt 90.266 kg (199 lb)  BMI 29.37 kg/m2  SpO2 100%  LMP 07/12/2015      Fetal tracings:  Reactive, BL 150 bpm       Contractions:   none      Uterus gravid and non-tender      Extremities: extremities normal, atraumatic, no cyanosis or edema and no significant edema and no signs of DVT          Labs:    No results found for this or any previous visit (from the past 24 hour(s)).       Meds: . bisacodyl  10 mg Rectal Once  . docusate sodium  100 mg Oral BID  . prenatal multivitamin  1 tablet Oral Q1200    A: [redacted]w[redacted]d with PPROM at 13 weeks     stable  P: Continue current plan of care      Upcoming tests/treatments: NST TID; [place] 01/14/16      MDs will follow  [place], [place] 01/03/2016 11:55 AM

## 2016-01-04 NOTE — Progress Notes (Addendum)
Hospital day # 16 pregnancy at [redacted]w[redacted]d--PPROM at 13 weeks.  S:  Doing well.       Perception of contractions: none      Vaginal bleeding: none        Vaginal discharge:  none   O: BP 116/60 mmHg  Pulse 82  Temp(Src) 97.6 F (36.4 C) (Oral)  Resp 16  Ht 5\' 9"  (1.753 m)  Wt 90.266 kg (199 lb)  BMI 29.37 kg/m2  SpO2 100%  LMP 07/12/2015      Fetal tracings:   Reassuring, 155 bpm, moderate variability,       Contractions:   none       Uterus gravid and non-tender      Extremities: no significant edema and no signs of DVT          Labs:  Results for orders placed or performed during the hospital encounter of 12/19/15 (from the past 24 hour(s))  Type and screen [place]     Status: None   Collection Time: 01/03/16  5:09 PM  Result Value Ref Range   ABO/RH(D) O POS    Antibody Screen NEG    Sample Expiration 01/06/2016           Meds:  . bisacodyl  10 mg Rectal Once  . docusate sodium  100 mg Oral BID  . prenatal multivitamin  1 tablet Oral Q1200    A: [redacted]w[redacted]d with PPROM at 13 weeks     Stable      NST reassuring  P: Continue current plan of care      Upcoming tests/treatments:  NST TID; [place] 01/14/16      MDs will follow  [place], [place] 01/04/2016 9:20 AM   Reviewed and agreed

## 2016-01-04 NOTE — Progress Notes (Signed)
Pt requested that EFM be stopped due to family members visiting. Pt states she will be uncomfortable with monitors on while visitors are here. Will contact provider.

## 2016-01-05 MED ORDER — GRX ANALGESIC BALM EX OINT
1.0000 "application " | TOPICAL_OINTMENT | CUTANEOUS | Status: DC | PRN
  Administered 2016-01-05 – 2016-01-06 (×2): 1 via CUTANEOUS
  Filled 2016-01-05: qty 28

## 2016-01-05 MED ORDER — MUSCLE RUB 10-15 % EX CREA
TOPICAL_CREAM | CUTANEOUS | Status: DC | PRN
  Filled 2016-01-05: qty 85

## 2016-01-05 NOTE — Progress Notes (Signed)
Hospital day # 17 pregnancy at [redacted]w[redacted]d--PPROM at 13 weeks.  S:  Doing well      Perception of contractions: none      Vaginal bleeding: none       Vaginal discharge:  none and no significant change  O: BP 121/67 mmHg  Pulse 70  Temp(Src) 97.5 F (36.4 C) (Oral)  Resp 16  Ht 5\' 9"  (1.753 m)  Wt 90.266 kg (199 lb)  BMI 29.37 kg/m2  SpO2 100%  LMP 07/12/2015      Fetal tracings:    Reassuring, moderate variability      Contractions:   None       Uterus gravid and non-tender      Extremities: no significant edema and no signs of DVT          Labs:  No results found for this or any previous visit (from the past 24 hour(s)).       Meds:   . bisacodyl  10 mg Rectal Once  . docusate sodium  100 mg Oral BID  . prenatal multivitamin  1 tablet Oral Q1200    A: [redacted]w[redacted]d with PPROM at 13 weeks     Stable      Reassuring -- moderate variability,  BL 160-165 bpm   P: Continue current plan of care   - Watch  NST/ FHR baseline up from 150       Upcoming tests/treatments:  NST TID; [place] 01/14/16      MDs will follow   [place], [place] 01/05/2016 12:15 PM

## 2016-01-06 LAB — TYPE AND SCREEN
ABO/RH(D): O POS
Antibody Screen: NEGATIVE

## 2016-01-06 NOTE — Progress Notes (Addendum)
Hospital day # 18 pregnancy at [redacted]w[redacted]d--PPROM at 13 weeks..  S:  Perception of contractions: none      Vaginal bleeding: none now       Vaginal discharge:  no significant change  O: BP 106/63 mmHg  Pulse 71  Temp(Src) 98 F (36.7 C) (Oral)  Resp 18  Ht 5\' 9"  (1.753 m)  Wt 199 lb (90.266 kg)  BMI 29.37 kg/m2  SpO2 99%  LMP 07/12/2015      Fetal tracings:155, moderate variability, + accel, ocassional variable decel      Contractions:  none       Uterus gravid and non-tender      Extremities: no significant edema and no signs of DVT          Labs:  No results found for this or any previous visit (from the past 24 hour(s)).       Meds:  Current facility-administered medications:  .  acetaminophen (TYLENOL) tablet 650 mg, 650 mg, Oral, Q4H PRN, Lavetta Nielsen, CNM, 650 mg at 01/05/16 1636 .  bisacodyl (DULCOLAX) suppository 10 mg, 10 mg, Rectal, Once, Lavetta Nielsen, CNM, 10 mg at 12/27/15 2245 .  calcium carbonate (TUMS - dosed in mg elemental calcium) chewable tablet 400 mg of elemental calcium, 2 tablet, Oral, Q4H PRN, Lavetta Nielsen, CNM, 400 mg of elemental calcium at 12/20/15 1553 .  docusate sodium (COLACE) capsule 100 mg, 100 mg, Oral, BID, Gavin Pound, CNM, 100 mg at 01/02/16 2223 .  famotidine (PEPCID) tablet 20 mg, 20 mg, Oral, BID PRN, Donnel Saxon, CNM, 20 mg at 12/26/15 1734 .  GRX ANALGESIC BALM OINT 1 application, 1 application, Apply externally, PRN, Delsa Bern, MD, 1 application at [id number] .  polyethylene glycol (MIRALAX / GLYCOLAX) packet 17 g, 17 g, Oral, Daily PRN, Naima Dillard, MD, 17 g at 12/29/15 1153 .  prenatal multivitamin tablet 1 tablet, 1 tablet, Oral, Q1200, Lavetta Nielsen, CNM, 1 tablet at 01/06/16 [id number] .  zolpidem (AMBIEN) tablet 5 mg, 5 mg, Oral, QHS PRN, Lavetta Nielsen, CNM, 5 mg at 12/25/15 2340  A: [redacted]w[redacted]d with PPROM at 13 weeks.     stable     Fetal tracings: appropriate for GA     Contractions: none     Uterus non-tender      Extremities: DTR 1+,  no clonus, no edema   P: Continue current plan of care      Upcoming tests/treatments:  NST QS, [place] 01/14/16,       Consult with Dr.  for plan on care      MDs will follow  Venus Standard, CNM, MSN 01/06/2016. 12:48 PM  I saw and examined patient and agree with above findings, assessment and plan.  Dr. Alesia Richards.

## 2016-01-07 NOTE — Progress Notes (Signed)
Hospital day # 19 pregnancy at [redacted]w[redacted]d--PPROM at 13 weeks.  S:  Doing well--denies any issues.  Feels "days are speeding by".  Coping well with hospitalization.      Perception of contractions: None      Vaginal bleeding: None       Vaginal discharge:  No significant change  O: BP 118/63 mmHg  Pulse 85  Temp(Src) 98.4 F (36.9 C) (Oral)  Resp 16  Ht 5\' 9"  (1.753 m)  Wt 90.266 kg (199 lb)  BMI 29.37 kg/m2  SpO2 99%  LMP 07/12/2015      Fetal tracings:   Reassuring on TID tracing      Contractions:   None      Uterus non-tender      Extremities: no significant edema and no signs of DVT          Labs:  T&S done 01/06/16       Meds:  . bisacodyl  10 mg Rectal Once  . docusate sodium  100 mg Oral BID  . prenatal multivitamin  1 tablet Oral Q1200    A: [redacted]w[redacted]d with PPROM at 13 weeks     Stable  P: Continue current plan of care      Upcoming tests/treatments:   NST TID      [place] 01/14/16      MDs will follow  Donnel Saxon CNM, [place] 01/07/2016 11:28 AM

## 2016-01-08 NOTE — Progress Notes (Signed)
Hospital day # 20 pregnancy at [redacted]w[redacted]d--PPROM at 13 weeks.  S:  Perception of contractions: none      Vaginal bleeding: none now       Vaginal discharge:  no significant change  O: BP 104/58 mmHg  Pulse 76  Temp(Src) 97.6 F (36.4 C) (Oral)  Resp 18  Ht 5\' 9"  (1.753 m)  Wt 201 lb (91.173 kg)  BMI 29.67 kg/m2  SpO2 99%  LMP 07/12/2015      Fetal tracings:reassuring      Contractions:  none       Uterus gravid and non-tender      Extremities: no significant edema and no signs of DVT          Labs:  No results found for this or any previous visit (from the past 24 hour(s)).       Meds:  Current facility-administered medications:  .  acetaminophen (TYLENOL) tablet 650 mg, 650 mg, Oral, Q4H PRN, Lavetta Nielsen, CNM, 650 mg at 01/05/16 1636 .  bisacodyl (DULCOLAX) suppository 10 mg, 10 mg, Rectal, Once, Lavetta Nielsen, CNM, 10 mg at 12/27/15 2245 .  calcium carbonate (TUMS - dosed in mg elemental calcium) chewable tablet 400 mg of elemental calcium, 2 tablet, Oral, Q4H PRN, Lavetta Nielsen, CNM, 400 mg of elemental calcium at 12/20/15 1553 .  docusate sodium (COLACE) capsule 100 mg, 100 mg, Oral, BID, Gavin Pound, CNM, 100 mg at 01/02/16 2223 .  famotidine (PEPCID) tablet 20 mg, 20 mg, Oral, BID PRN, Donnel Saxon, CNM, 20 mg at 12/26/15 1734 .  GRX ANALGESIC BALM OINT 1 application, 1 application, Apply externally, PRN, Delsa Bern, MD, 1 application at [phone] .  polyethylene glycol (MIRALAX / GLYCOLAX) packet 17 g, 17 g, Oral, Daily PRN, Naima Dillard, MD, 17 g at 12/29/15 1153 .  prenatal multivitamin tablet 1 tablet, 1 tablet, Oral, Q1200, Lavetta Nielsen, CNM, 1 tablet at 01/08/16 0930 .  zolpidem (AMBIEN) tablet 5 mg, 5 mg, Oral, QHS PRN, Lavetta Nielsen, CNM, 5 mg at 12/25/15 2340  A: [redacted]w[redacted]d with PPROM at 13 weeks     stable     Fetal tracings: 150, moderate variability, + accel, occasional variable decel     Contractions: none     Uterus non-tender      Extremities: DTR 1+, no  clonus, no edema  P: Continue current plan of care      Upcoming tests/treatments:  NST TID,US 01/14/16      MDs will follow  Hardie Veltre, CNM, MSN 01/08/2016. 11:58 AM

## 2016-01-08 NOTE — Progress Notes (Signed)
@   1620 FHR went down to 130 bpm with return to baseline.

## 2016-01-08 NOTE — Progress Notes (Signed)
V. Standard, CNM viewed EFM tracing, pt to remain on monitor until 1830, if no further decels EFM may be removed.

## 2016-01-08 NOTE — Progress Notes (Signed)
Subjective: Strip and Chart review  Objective: BP 114/57 mmHg  Pulse 79  Temp(Src) 97.8 F (36.6 C) (Oral)  Resp 14  Ht 5\' 9"  (1.753 m)  Wt 91.173 kg (201 lb)  BMI 29.67 kg/m2  SpO2 99%  LMP 07/12/2015     Filed Vitals:   01/08/16 1610 01/08/16 1950 01/08/16 2100 01/08/16 2133  BP: 111/63 114/57    Pulse: 89 79    Temp: 97.4 F (36.3 C) 97.8 F (36.6 C)    TempSrc: Oral Oral    Resp: 20 18 16 14   Height:      Weight:      SpO2:         FHT: Reassuring, 150 bpm, moderate variability,   (2132 01/08/16) UC:   none   Assessment: : [redacted]w[redacted]d with PPROM at 13 weeks  stable  Fetal tracings:   Reassuring, 150, moderate variability,  Contractions: none   Plan:: Continue current plan of care  Upcoming tests/treatments: NST TID,US 01/14/16  MDs will follow   [place], [place] 01/08/2016, 11:18 PM

## 2016-01-09 LAB — TYPE AND SCREEN
ABO/RH(D): O POS
ANTIBODY SCREEN: NEGATIVE

## 2016-01-09 NOTE — Progress Notes (Signed)
Pt off the monitor after reassuring FHR

## 2016-01-10 NOTE — Progress Notes (Signed)
Subjective: Strip and chart review  Objective: BP 110/69 mmHg  Pulse 81  Temp(Src) 97.8 F (36.6 C) (Oral)  Resp 18  Ht 5\' 9"  (1.753 m)  Wt 91.173 kg (201 lb)  BMI 29.67 kg/m2  SpO2 100%  LMP 07/12/2015      FHT: Reassuring, 150 bpm, moderate variability   (2200 01/08/15) UC:   none  Assessment: : [redacted]w[redacted]d with PPROM at 13 weeks  stable  Fetal tracings: Reassuring, 150, moderate variability,  Contractions: none   Plan:: Continue current plan of care  Upcoming tests/treatments: NST TID,US 01/14/16  MDs will follow    [place], [place] 01/10/2016, 3:35 AM

## 2016-01-11 NOTE — Progress Notes (Signed)
Patient ID: Erica Roth, female   DOB: [birth year], 35 y.o.   MRN: [id number]  Erica Roth is a 35 y.o. G1P0 at [redacted]w[redacted]d admitted for PPROM  Subjective: Denies VB, change in discharge or CTXs.  Reports good FM.  Objective: BP 110/68 mmHg  Pulse 88  Temp(Src) 98.2 F (36.8 C) (Oral)  Resp 18  Ht 5\' 9"  (1.753 m)  Wt 201 lb (91.173 kg)  BMI 29.67 kg/m2  SpO2 100%  LMP 07/12/2015      Physical Exam:  Gen: alert Chest/Lungs: cta bilaterally  Heart/Pulse: RRR  Abdomen: soft, gravid, nontender, BX x4 quad Uterine fundus: soft, nontender Skin & Color: warm and dry  EXT: no calf tenderness  FHT:  FHR: 140s-150s bpm, variability: moderate,  accelerations:  Present,  decelerations:  Absent UC:   none SVE:    deferred  Labs: Lab Results  Component Value Date   WBC 10.2 12/27/2015   HGB 10.8* 12/27/2015   HCT 31.7* 12/27/2015   MCV 89.3 12/27/2015   PLT 236 12/27/2015    Assessment and Plan: has Preterm premature rupture of membranes (PPROM) with unknown onset of labor; Fibroids; Preterm premature rupture of membranes; and Oligohydramnios antepartum on her problem list. Cont observation S/p BMZ and antibiotics Glucola within the next week Fetal status is overall reassuring  Roel Douthat Y 01/11/2016, 3:59 PM

## 2016-01-12 LAB — TYPE AND SCREEN
ABO/RH(D): O POS
ANTIBODY SCREEN: NEGATIVE

## 2016-01-12 NOTE — Progress Notes (Signed)
Lyndie Frack [id number]  Subjective: Nurse call reports infant with an incident of variables. Strip and Chart Reviewed.  Objective:  Filed Vitals:   01/12/16 1206 01/12/16 1615 01/12/16 1945 01/12/16 2104  BP: 102/61 123/56 105/69   Pulse: 80 88 76   Temp: 98.1 F (36.7 C) 97.4 F (36.3 C) 97.9 F (36.6 C)   TempSrc: Oral Oral Oral   Resp: 18 20 16 16   Height:      Weight:      SpO2:        FHR: 145bpm, Mod Var, +Variable Decels x 1 min, +Accels appropriate for GA UC: Irritability Noted  Assessment: IUP at [redacted]w[redacted]d Cat I FT Overall Reassuring NST for GA  Plan: -Continue to monitor for additional 44minutes -Report any additional variables -Continue other mgmt as ordered  Milinda Cave, CNM 01/12/2016 9:38 PM

## 2016-01-12 NOTE — Progress Notes (Signed)
Pt in the shower 

## 2016-01-12 NOTE — Progress Notes (Signed)
Pt sleeping. 

## 2016-01-12 NOTE — Progress Notes (Addendum)
Patient ID: Erica Roth, female   DOB: [birth year], 35 y.o.   MRN: [id number]  Erica Roth is a 35 y.o. G1P0 at [redacted]w[redacted]d admitted for PPROM  Subjective: Denies VB, change in discharge or CTXs. Reports good FM.  Objective: Filed Vitals:   01/11/16 2246 01/12/16 0605 01/12/16 0810 01/12/16 1206  BP: 102/60 103/55 98/55 102/61  Pulse: 79 78 74 80  Temp: 97.7 F (36.5 C) 97.6 F (36.4 C) 98.3 F (36.8 C) 98.1 F (36.7 C)  TempSrc: Oral  Oral Oral  Resp: 18 18 20 18   Height:      Weight:      SpO2:         Physical Exam:  Gen: alert  Abdomen: soft, gravid, nontender.  Uterine fundus: soft, nontender Skin & Color: warm and dry  EXT: no calf tenderness  FHT: Category 1 UC: none SVE:  deferred  Labs:  Recent Labs    Lab Results  Component Value Date   WBC 10.2 12/27/2015   HGB 10.8* 12/27/2015   HCT 31.7* 12/27/2015   MCV 89.3 12/27/2015   PLT 236 12/27/2015      Assessment and Plan:  Preterm premature rupture of membranes (PPROM) with unknown onset of labor; Fibroids; Preterm premature rupture of membranes; and Oligohydramnios antepartum on her problem list. Cont observation S/p BMZ and antibiotics Glucola within the next week Fetal status is overall reassuring

## 2016-01-13 MED ORDER — HYDROCORTISONE 1 % EX CREA
TOPICAL_CREAM | Freq: Two times a day (BID) | CUTANEOUS | Status: DC
  Administered 2016-01-13 – 2016-01-18 (×5): via TOPICAL
  Filled 2016-01-13: qty 28

## 2016-01-13 NOTE — Progress Notes (Addendum)
Patient ID: Erica Roth, female   DOB: 1981/03/14, 35 y.o.   MRN: [id number]  Beau Costantino is a 35 y.o. G1P0 at [redacted]w[redacted]d admitted for PPROM  Subjective: Denies VB, change in discharge or CTXs. Reports good FM.  Objective: Filed Vitals:   01/12/16 2200 01/12/16 2205 01/13/16 1024 01/13/16 1610  BP:   111/54 112/62  Pulse: 79  69 76  Temp:   98.5 F (36.9 C) 97.7 F (36.5 C)  TempSrc:   Oral Axillary  Resp:  16 16 16   Height:      Weight:      SpO2: 99%                                                               Physical Exam:  Gen: alert  Abdomen: soft, gravid, nontender.  Uterine fundus: soft, nontender Skin & Color: warm and dry.  Several hyperpigmented linear rash on lateral sides of arms, bilateral legs.  EXT: no calf tenderness  FHT: Category 1 UC: none SVE: deferred  Labs:  Recent Labs    Lab Results  Component Value Date   WBC 10.2 12/27/2015   HGB 10.8* 12/27/2015   HCT 31.7* 12/27/2015   MCV 89.3 12/27/2015   PLT 236 12/27/2015      Assessment and Plan:  Preterm premature rupture of membranes (PPROM) with unknown onset of labor; Fibroids; Preterm premature rupture of membranes; and Oligohydramnios antepartum on her problem list. Due for f/u ultrasound 2/21.  Cont observation, close NST follow up S/p BMZ and antibiotics Glucola within the next week Fetal status is overall reassurring Hydrocortisone for skin eruption, likely allergic reaction, c/w close observation.   Dr. Alesia Richards.

## 2016-01-14 ENCOUNTER — Inpatient Hospital Stay (HOSPITAL_COMMUNITY): Payer: Medicaid Other

## 2016-01-14 NOTE — Progress Notes (Signed)
[name] [id number]  Subjective: Strip and Chart Reviewed.  Objective:  Filed Vitals:   01/13/16 1610 01/13/16 2014 01/14/16 1150 01/14/16 1712  BP: 112/62 103/72 118/66 116/61  Pulse: 76 79 92 79  Temp: 97.7 F (36.5 C) 98.1 F (36.7 C) 98.5 F (36.9 C) 98.1 F (36.7 C)  TempSrc: Axillary Oral Oral Oral  Resp: 16 18 16 16   Height:      Weight:      SpO2:       2218-2300 FHR: 145 bpm, Mod Var, + Variable Decels, +10x10 Accels UC: None Graphed  Assessment: IUP at [redacted]w[redacted]d Cat I FT-Overall  Plan: -Variables Appropriate for GA -Otherwise reassuring -Continue other mgmt as ordered  Milinda Cave, CNM 01/14/2016 10:50 PM

## 2016-01-14 NOTE — Progress Notes (Signed)
strip reviewed, reassured, occasional variable decels no more that usual

## 2016-01-14 NOTE — Progress Notes (Signed)
Patient ID: Erica Roth, female   DOB: 1981/04/30, 35 y.o.   MRN: [id number] Erica Roth is a 35 y.o. G1P0 at [redacted]w[redacted]d admitted for PPROM   Subjective: Denies ctxs, vb and reports good FM.  Pt reports leaking only occasionally clear fluid.  Some days no leaking.    Objective: BP 118/66 mmHg  Pulse 92  Temp(Src) 98.5 F (36.9 C) (Oral)  Resp 16  Ht 5\' 9"  (1.753 m)  Wt 201 lb (91.173 kg)  BMI 29.67 kg/m2  SpO2 99%  LMP 07/12/2015     Physical Exam:  Gen: alert Chest/Lungs: cta bilaterally  Heart/Pulse: RRR  Abdomen: soft, gravid, nontender Uterine fundus: soft, nontender Skin & Color: warm and dry  EXT: no calf tenderness  FHT:  FHR: 140s bpm, variability: moderate,  accelerations:  Present,  decelerations:  Absent UC:   none SVE:    deferred  Labs: Lab Results  Component Value Date   WBC 10.2 12/27/2015   HGB 10.8* 12/27/2015   HCT 31.7* 12/27/2015   MCV 89.3 12/27/2015   PLT 236 12/27/2015   U/s today AGA, nl fluid, AFI 11 24%, variable lie  Assessment and Plan: has Preterm premature rupture of membranes (PPROM) with unknown onset of labor; Fibroids; Preterm premature rupture of membranes; and Oligohydramnios antepartum on her problem list. Cont observation in hospital with delivery at 34wks (I spoke with Dr. Lisbeth Renshaw) Plan glucola on Friday Plan GBS next week Repeat u/s in 4wks (02/11/16) Fetal status is reassuring  Erica Roth Y 01/14/2016, 3:06 PM

## 2016-01-15 LAB — COMPREHENSIVE METABOLIC PANEL
ALK PHOS: 73 U/L (ref 38–126)
ALT: 14 U/L (ref 14–54)
ANION GAP: 6 (ref 5–15)
AST: 25 U/L (ref 15–41)
Albumin: 2.9 g/dL — ABNORMAL LOW (ref 3.5–5.0)
BILIRUBIN TOTAL: 0.5 mg/dL (ref 0.3–1.2)
BUN: 5 mg/dL — ABNORMAL LOW (ref 6–20)
CHLORIDE: 106 mmol/L (ref 101–111)
CO2: 22 mmol/L (ref 22–32)
CREATININE: 0.46 mg/dL (ref 0.44–1.00)
Calcium: 8.6 mg/dL — ABNORMAL LOW (ref 8.9–10.3)
Glucose, Bld: 111 mg/dL — ABNORMAL HIGH (ref 65–99)
POTASSIUM: 3.3 mmol/L — AB (ref 3.5–5.1)
SODIUM: 134 mmol/L — AB (ref 135–145)
Total Protein: 7.3 g/dL (ref 6.5–8.1)

## 2016-01-15 LAB — TYPE AND SCREEN
ABO/RH(D): O POS
Antibody Screen: NEGATIVE

## 2016-01-15 LAB — AMYLASE: AMYLASE: 90 U/L (ref 28–100)

## 2016-01-15 LAB — LIPASE, BLOOD: Lipase: 36 U/L (ref 11–51)

## 2016-01-15 MED ORDER — DIPHENHYDRAMINE-ZINC ACETATE 2-0.1 % EX CREA
TOPICAL_CREAM | Freq: Two times a day (BID) | CUTANEOUS | Status: DC | PRN
  Administered 2016-01-15: 1 via TOPICAL
  Administered 2016-01-16 – 2016-01-17 (×2): via TOPICAL
  Filled 2016-01-15: qty 28

## 2016-01-15 NOTE — Progress Notes (Signed)
Pt sleeping. 

## 2016-01-15 NOTE — Progress Notes (Addendum)
Hospital day # 27 pregnancy at [redacted]w[redacted]d--PPROM at 13 weeks.  S:  Perception of contractions: none      Vaginal bleeding: none now       Vaginal discharge:  no significant change    Pt c/o itching all over x 2 days  O: BP 107/68 mmHg  Pulse 77  Temp(Src) 98.2 F (36.8 C) (Oral)  Resp 20  Ht 5\' 9"  (1.753 m)  Wt 200 lb 4.8 oz (90.855 kg)  BMI 29.57 kg/m2  SpO2 99%  LMP 07/12/2015      Fetal tracings:140, moderate variability, + accel, occasional variable decel      Contractions:  none       Uterus gravid and non-tender      Extremities: extremities normal, atraumatic, no cyanosis or edema and no significant edema and no signs of DVT, no rash, redness noted          Labs:  No results found for this or any previous visit (from the past 24 hour(s)).       Meds:  Current facility-administered medications:  .  acetaminophen (TYLENOL) tablet 650 mg, 650 mg, Oral, Q4H PRN, Lavetta Nielsen, CNM, 650 mg at 01/15/16 1131 .  bisacodyl (DULCOLAX) suppository 10 mg, 10 mg, Rectal, Once, Lavetta Nielsen, CNM, 10 mg at 12/27/15 2245 .  calcium carbonate (TUMS - dosed in mg elemental calcium) chewable tablet 400 mg of elemental calcium, 2 tablet, Oral, Q4H PRN, Lavetta Nielsen, CNM, 400 mg of elemental calcium at 12/20/15 1553 .  docusate sodium (COLACE) capsule 100 mg, 100 mg, Oral, BID, Gavin Pound, CNM, 100 mg at 01/09/16 1046 .  famotidine (PEPCID) tablet 20 mg, 20 mg, Oral, BID PRN, Donnel Saxon, CNM, 20 mg at 01/13/16 2237 .  GRX ANALGESIC BALM OINT 1 application, 1 application, Apply externally, PRN, Delsa Bern, MD, 1 application at [phone] .  hydrocortisone cream 1 %, , Topical, BID, Ema Kulwa, MD .  polyethylene glycol (MIRALAX / GLYCOLAX) packet 17 g, 17 g, Oral, Daily PRN, Crawford Givens, MD, 17 g at 12/29/15 1153 .  prenatal multivitamin tablet 1 tablet, 1 tablet, Oral, Q1200, Lavetta Nielsen, CNM, 1 tablet at 01/15/16 1131 .  zolpidem (AMBIEN) tablet 5 mg, 5 mg, Oral, QHS PRN, Lavetta Nielsen,  CNM, 5 mg at 01/10/16 2248  A: [redacted]w[redacted]d with PPROM at 13 weeks     stable     Fetal tracings: reassured     Contractions: none     Uterus non-tender      Extremities: DTR 1+, no clonus, no edema   P: Continue current plan of care      Upcoming tests/treatments:     NST BID,   Glucola, cbc,  rpr and hiv on friday      Cholestasis labs      MDs will follow  Venus Standard, CNM, MSN 01/15/2016. 12:06 PM  Seen and agreed CMP normal, awaiting bile salts

## 2016-01-15 NOTE — Progress Notes (Signed)
Pt c/o itching "all over" and requesting treatment.  Pt reports using  Hydrocortisone cream but states it's not working.  RN informed pt that provider will be notified regarding complaint.

## 2016-01-16 LAB — BILE ACIDS, TOTAL: Bile Acids Total: 9.3 umol/L (ref 4.7–24.5)

## 2016-01-16 NOTE — Progress Notes (Signed)
Patient ID: [name], female   DOB: [birth year], 35 y.o.   MRN: [id number] Erica Roth is a 35 y.o. G1P0 at [redacted]w[redacted]d admitted for PPROM  Subjective: Denies VB, change in discharge.  Reports good FM and intermittent LOF.  Objective: BP 104/65 mmHg  Pulse 76  Temp(Src) 97.5 F (36.4 C) (Oral)  Resp 18  Ht 5\' 9"  (1.753 m)  Wt 200 lb 4.8 oz (90.855 kg)  BMI 29.57 kg/m2  SpO2 100%  LMP 07/12/2015     Physical Exam:  Gen: alert Chest/Lungs: cta bilaterally  Heart/Pulse: RRR  Abdomen: soft, gravid, nontender, BX x4 quad Uterine fundus: soft, nontender Skin & Color: warm and dry  EXT: no calf tenderness  FHT:  FHR: 130s bpm, variability: moderate,  accelerations:  Present,  decelerations:  Absent UC:   none SVE:    deferred  Labs: Lab Results  Component Value Date   WBC 10.2 12/27/2015   HGB 10.8* 12/27/2015   HCT 31.7* 12/27/2015   MCV 89.3 12/27/2015   PLT 236 12/27/2015    Assessment and Plan: has Preterm premature rupture of membranes (PPROM) with unknown onset of labor; Fibroids; Preterm premature rupture of membranes; and Oligohydramnios antepartum on her problem list. Cont observation Currently stable  Erica Roth Y 01/16/2016, 11:40 AM

## 2016-01-17 LAB — CBC
HCT: 32.7 % — ABNORMAL LOW (ref 36.0–46.0)
HEMOGLOBIN: 11.1 g/dL — AB (ref 12.0–15.0)
MCH: 30.2 pg (ref 26.0–34.0)
MCHC: 33.9 g/dL (ref 30.0–36.0)
MCV: 88.9 fL (ref 78.0–100.0)
PLATELETS: 225 10*3/uL (ref 150–400)
RBC: 3.68 MIL/uL — AB (ref 3.87–5.11)
RDW: 13.6 % (ref 11.5–15.5)
WBC: 6.5 10*3/uL (ref 4.0–10.5)

## 2016-01-17 LAB — GLUCOSE TOLERANCE, 1 HOUR: GLUCOSE 1 HOUR GTT: 141 mg/dL — AB (ref 70–140)

## 2016-01-17 NOTE — Progress Notes (Addendum)
Hospital day # 29 pregnancy at [redacted]w[redacted]d--PPROM at 13 weeks, variable lie, bilateral clubfeet.  S:  Doing well.  Request T&S be done weekly, instead of q 3 days.  Has had some overall itching--cholestasis labs WNL on 01/15/16.  Improved now.      Perception of contractions: none      Vaginal bleeding: none now       Vaginal discharge:  no significant change  Reviewed results of today's 1 hour GTT with patient, and discussed need for 3 hour GTT.  She wishes to proceed with this tomorrow.  O: BP 106/60 mmHg  Pulse 78  Temp(Src) 98.5 F (36.9 C) (Oral)  Resp 16  Ht 5\' 9"  (1.753 m)  Wt 90.855 kg (200 lb 4.8 oz)  BMI 29.57 kg/m2  SpO2 100%  LMP 07/12/2015      Fetal tracings:  150-160, moderate variability, occasional quick variable decel      Contractions:   None      Uterus non-tender      Extremities: no significant edema and no signs of DVT   US on 01/14/16:  Variable lie, EFW 928 gm, 2+1, 50%ile, AFI 11.71, 24%ile, anterior placenta, cervix 3.6. ? Possible right clubfoot, left WNL.  LUS fibroid 5 x 7.3 x 5.2          Labs:   Results for orders placed or performed during the hospital encounter of 12/19/15 (from the past 24 hour(s))  Glucose tolerance, 1 hour     Status: Abnormal   Collection Time: 01/17/16 12:41 PM  Result Value Ref Range   Glucose, 1 Hour GTT 141 (H) 70 - 140 mg/dL  CBC     Status: Abnormal   Collection Time: 01/17/16 12:41 PM  Result Value Ref Range   WBC 6.5 4.0 - 10.5 K/uL   RBC 3.68 (L) 3.87 - 5.11 MIL/uL   Hemoglobin 11.1 (L) 12.0 - 15.0 g/dL   HCT 32.7 (L) 36.0 - 46.0 %   MCV 88.9 78.0 - 100.0 fL   MCH 30.2 26.0 - 34.0 pg   MCHC 33.9 30.0 - 36.0 g/dL   RDW 13.6 11.5 - 15.5 %   Platelets 225 150 - 400 K/uL   RPR and HIV pending.       Meds:  . bisacodyl  10 mg Rectal Once  . docusate sodium  100 mg Oral BID  . hydrocortisone cream   Topical BID  . prenatal multivitamin  1 tablet Oral Q1200    A: [redacted]w[redacted]d with PPROM since 13 weeks, variable lie, ?  right clubfoot     AFI 11.71 on 01/14/16     LUS fibroid     Elevated 1 hour GTT     Stable  P: Continue current plan of care      Upcoming tests/treatments:   NST BID      3 hour GTT tomorrow.      Change T&S to weekly--next due 01/22/16 (ORDERED)      Next [place] 02/11/16      MDs will follow  Donnel Saxon CNM, [place] 01/17/2016 5:36 PM

## 2016-01-18 LAB — GLUCOSE, 1 HOUR GESTATIONAL

## 2016-01-18 LAB — HIV ANTIBODY (ROUTINE TESTING W REFLEX): HIV SCREEN 4TH GENERATION: NONREACTIVE

## 2016-01-18 LAB — GLUCOSE, 3 HOUR GESTATIONAL: Glucose, GTT - 3 Hour: 131 mg/dL (ref 70–144)

## 2016-01-18 LAB — GLUCOSE, 2 HOUR GESTATIONAL: Glucose Tolerance, 2 hour: 157 mg/dL (ref 70–164)

## 2016-01-18 LAB — RPR: RPR: NONREACTIVE

## 2016-01-18 LAB — GLUCOSE, FASTING GESTATIONAL: GLUCOSE, FASTING-GESTATIONAL: 93 mg/dL

## 2016-01-18 MED ORDER — LORATADINE 10 MG PO TABS
10.0000 mg | ORAL_TABLET | Freq: Every day | ORAL | Status: DC
  Administered 2016-01-18 – 2016-01-19 (×2): 10 mg via ORAL
  Filled 2016-01-18 (×6): qty 1

## 2016-01-18 MED ORDER — HYDROXYZINE HCL 25 MG PO TABS
25.0000 mg | ORAL_TABLET | Freq: Four times a day (QID) | ORAL | Status: DC | PRN
  Filled 2016-01-18: qty 1

## 2016-01-18 MED ORDER — DIPHENHYDRAMINE HCL 25 MG PO CAPS
25.0000 mg | ORAL_CAPSULE | Freq: Four times a day (QID) | ORAL | Status: DC | PRN
  Administered 2016-01-18 (×3): 25 mg via ORAL
  Filled 2016-01-18 (×3): qty 1

## 2016-01-18 NOTE — Progress Notes (Signed)
Hospital day # 30 pregnancy at [redacted]w[redacted]d--PPROM at 13 weeks, variable lie, bilateral clubfeet. .  S: Pt continues to complain of itching and welts on legs and arms.Awaiting hypoallergenic sheets.  Benadryl help but only for the first 2 hours.  Pt denies contractions or vaginal bleeding.  Still leaks occasionally.  Active fetus.        O: BP 105/67 mmHg  Pulse 76  Temp(Src) 97.9 F (36.6 C) (Oral)  Resp 18  Ht 5\' 9"  (1.753 m)  Wt 90.855 kg (200 lb 4.8 oz)  BMI 29.57 kg/m2  SpO2 100%  LMP 07/12/2015      General:  Rubbing hands, scratching extremities.       Abdomen:  No fundal tenderness, no rash or bumps on abdomen      Extremities: welts/hived noted on lower exptremeties and no significant edema and no signs of DVT          Labs:  Results for orders placed or performed during the hospital encounter of 12/19/15 (from the past 24 hour(s))  Glucose, fasting gestational     Status: None   Collection Time: 01/18/16  5:12 AM  Result Value Ref Range   Glucose, Fasting-Gestational 93 mg/dL  Glucose, 1 hour gestational     Status: None   Collection Time: 01/18/16  7:10 AM  Result Value Ref Range   Glucose, 1 Hour-Gestational TEST REQUEST RECEIVED WITHOUT APPROPRIATE SPECIMEN 70 - 189 mg/dL  Glucose, 2 hour gestational     Status: None   Collection Time: 01/18/16  7:33 AM  Result Value Ref Range   Glucose, 2 Hour-Gestational 157 70 - 164 mg/dL  Glucose, 3 hour gestational     Status: None   Collection Time: 01/18/16  8:44 AM  Result Value Ref Range   Glucose, GTT - 3 Hour 131 70 - 144 mg/dL         Meds:  . bisacodyl  10 mg Rectal Once  . docusate sodium  100 mg Oral BID  . hydrocortisone cream   Topical BID  . loratadine  10 mg Oral Daily  . prenatal multivitamin  1 tablet Oral Q1200    A/P: [redacted]w[redacted]d with PPROM at 13 weeks, variable lie, bilateral clubfeet.  No s/sxs of chorioamnionitis.  Fetal status reassuring. Normal AFI on 01/14/16. NST BID.     Pruiritis. Cholestasis ruled  out.  PUPPS not likely.  Nursing recommends hypoallergenic sheets.  Start Claritin once daily.  Added Atarax.  LUS fibroid  Elevated 1 hour GTT-3 hr GTT not collected timely.  Repeat 3 hr GTT tomorrow.       Reviewed CNM note.   Thurnell Lose  01/18/2016 3:38 PM

## 2016-01-18 NOTE — Progress Notes (Signed)
Hypoallergenic linen cart ordered

## 2016-01-18 NOTE — Progress Notes (Signed)
Hospital day # 30 pregnancy at [redacted]w[redacted]d--PPROM at 13 weeks, variable lie, bilateral clubfeet. .  S:  Continues to complain of itching and welts on legs, arms and face.       Perception of contractions: none      Vaginal bleeding: none now       Vaginal discharge:   no significant change  O: BP 105/67 mmHg  Pulse 76  Temp(Src) 97.9 F (36.6 C) (Oral)  Resp 18  Ht 5\' 9"  (1.753 m)  Wt 90.855 kg (200 lb 4.8 oz)  BMI 29.57 kg/m2  SpO2 100%  LMP 07/12/2015      Fetal tracings:  Reassuring for EGA, 145 bpm      Contractions:   none      Uterus gravid and non-tender      Extremities: welts/hived noted on lower exptremeties and no significant edema and no signs of DVT          Labs:  Results for orders placed or performed during the hospital encounter of 12/19/15 (from the past 24 hour(s))  Glucose, fasting gestational     Status: None   Collection Time: 01/18/16  5:12 AM  Result Value Ref Range   Glucose, Fasting-Gestational 93 mg/dL  Glucose, 1 hour gestational     Status: None   Collection Time: 01/18/16  7:10 AM  Result Value Ref Range   Glucose, 1 Hour-Gestational TEST REQUEST RECEIVED WITHOUT APPROPRIATE SPECIMEN 70 - 189 mg/dL  Glucose, 2 hour gestational     Status: None   Collection Time: 01/18/16  7:33 AM  Result Value Ref Range   Glucose, 2 Hour-Gestational 157 70 - 164 mg/dL  Glucose, 3 hour gestational     Status: None   Collection Time: 01/18/16  8:44 AM  Result Value Ref Range   Glucose, GTT - 3 Hour 131 70 - 144 mg/dL         Meds:  . bisacodyl  10 mg Rectal Once  . docusate sodium  100 mg Oral BID  . hydrocortisone cream   Topical BID  . loratadine  10 mg Oral Daily  . prenatal multivitamin  1 tablet Oral Q1200    A: [redacted]w[redacted]d with PPROM at 13 weeks, variable lie, bilateral clubfeet.      AFI 11.71 on 01/14/16  LUS fibroid  Elevated 1 hour GTT      3 hr GTT- unable to determine, incorrectly collected      Cholestatis labs 2/22-WNL      stable  P:  Continue current plan of care      Upcoming tests/treatments: NST BID 3 hour GTT - Repeat (Pt request repeat on Monday because she was stuck      multilple times this morning trying to obtain samples for GTT  Change T&S to weekly--next due 01/22/16 (ORDERED) Next [place] 02/11/16      MDs will follow  [place], [place] 01/18/2016 2:42 PM

## 2016-01-19 NOTE — Progress Notes (Signed)
Hospital day # 31 pregnancy at [redacted]w[redacted]d--PPROM at 13 weeks, variable lie, bilateral clubfeet. .  S: Pt states itching has resolved.  Decreased last night after new sheets received.  Took Claritin this am.  Pt denies contractions or vaginal bleeding. Active fetus.        O: BP 110/61 mmHg  Pulse 87  Temp(Src) 98 F (36.7 C) (Oral)  Resp 18  Ht 5\' 9"  (1.753 m)  Wt 90.855 kg (200 lb 4.8 oz)  BMI 29.57 kg/m2  SpO2 92%  LMP 07/12/2015      General:  Rubbing hands, scratching extremities.       Abdomen:  No fundal tenderness, no rash or bumps on abdomen      Extremities: Welts,hives have resolved.  No calf tenderness.          Labs:  No results found for this or any previous visit (from the past 24 hour(s)).       Meds:  . bisacodyl  10 mg Rectal Once  . docusate sodium  100 mg Oral BID  . hydrocortisone cream   Topical BID  . loratadine  10 mg Oral Daily  . prenatal multivitamin  1 tablet Oral Q1200    A/P: 27w 2d with PPROM at 13 weeks, variable lie, bilateral clubfeet.  No s/sxs of chorioamnionitis.  Fetal status reassuring. Normal AFI on 01/14/16. NST BID.     Pruiritis. Possibly due to bedding.  Continue Claritin for now.    LUS fibroid  Elevated 1 hour GTT-3 hr GTT not collected timely.  Repeat 3 hr GTT on 01/20/16 per CNM.       Reviewed CNM note.   Thurnell Lose  01/19/2016 12:53 PM

## 2016-01-19 NOTE — Progress Notes (Signed)
Hospital day # 31 pregnancy at [redacted]w[redacted]d--PPROM at 13 weeks, variable lie, bilateral clubfeet.  S:  Received hypoallergenic sheets. No longer itching, welts gone.       Perception of contractions: none      Vaginal bleeding: none        Vaginal discharge:  occasional leaking  O: BP 111/72 mmHg  Pulse 83  Temp(Src) 98 F (36.7 C) (Oral)  Resp 18  Ht 5\' 9"  (1.753 m)  Wt 90.855 kg (200 lb 4.8 oz)  BMI 29.57 kg/m2  SpO2 100%  LMP 07/12/2015   Filed Vitals:   01/18/16 1645 01/18/16 2107 01/19/16 0636 01/19/16 1105  BP: 93/61 103/65 111/72   Pulse: 74 89 83   Temp: 98 F (36.7 C) 97.6 F (36.4 C) 97.3 F (36.3 C) 98 F (36.7 C)  TempSrc: Oral Oral Oral   Resp: 16 18 18    Height:      Weight:      SpO2:            Fetal tracings:reassuring, BL 145-150      Contractions:   none      Uterus gravid and non-tender      Extremities: no significant edema and no signs of DVT          Labs:   No results found for this or any previous visit (from the past 24 hour(s)).        Meds: . bisacodyl  10 mg Rectal Once  . docusate sodium  100 mg Oral BID  . hydrocortisone cream   Topical BID  . loratadine  10 mg Oral Daily  . prenatal multivitamin  1 tablet Oral Q1200    A: [redacted]w[redacted]d with PPROM at 13 weeks, variable lie, bilateral clubfeet     stable  P: Continue current plan of care     Continue use of hypoallergenic sheets/linens      Upcoming tests/treatments:   -3 hour GTT - Repeat ( ordered for 01/20/16) -Change T&S to weekly--next due 01/22/16 (ORDERED) -Next [place] 02/11/16      MDs will follow  [place], [place] 01/19/2016 11:34 AM

## 2016-01-20 LAB — GLUCOSE, 2 HOUR GESTATIONAL: GLUCOSE, 2 HOUR-GESTATIONAL: 152 mg/dL (ref 70–164)

## 2016-01-20 LAB — GLUCOSE, FASTING GESTATIONAL: Glucose Tolerance, Fasting: 90 mg/dL

## 2016-01-20 LAB — GLUCOSE, 1 HOUR GESTATIONAL: Glucose Tolerance, 1 hour: 155 mg/dL (ref 70–189)

## 2016-01-20 LAB — GLUCOSE, 3 HOUR GESTATIONAL: GLUCOSE 3 HOUR GTT: 132 mg/dL (ref 70–144)

## 2016-01-20 NOTE — Progress Notes (Addendum)
Hospital day # 32 pregnancy at [redacted]w[redacted]d--PPROM at 13 weeks, variable lie, bilateral clubfeet.  S:  Pt sleeping      Perception of contractions: none per RN      Vaginal bleeding: none per RN       Vaginal discharge:  no significant change  O: BP 105/65 mmHg  Pulse 84  Temp(Src) 97.5 F (36.4 C) (Oral)  Resp 18  Ht 5\' 9"  (1.753 m)  Wt 90.855 kg (200 lb 4.8 oz)  BMI 29.57 kg/m2  SpO2 92%  LMP 07/12/2015      Fetal tracings:   Reassuring, BL 135-140, moderate variability, +accel,10x10  (1130 2/27)      Contractions:   none               Labs:  Results for orders placed or performed during the hospital encounter of 12/19/15 (from the past 24 hour(s))  Glucose, fasting gestational     Status: None   Collection Time: 01/20/16  4:24 AM  Result Value Ref Range   Glucose, Fasting-Gestational 90 mg/dL  Glucose, 1 hour gestational     Status: None   Collection Time: 01/20/16  5:49 AM  Result Value Ref Range   Glucose, 1 Hour-Gestational 155 70 - 189 mg/dL  Glucose, 2 hour gestational     Status: None   Collection Time: 01/20/16  6:44 AM  Result Value Ref Range   Glucose, 2 Hour-Gestational 152 70 - 164 mg/dL  Glucose, 3 hour gestational     Status: None   Collection Time: 01/20/16  7:45 AM  Result Value Ref Range   Glucose, GTT - 3 Hour 132 70 - 144 mg/dL          Meds:  . bisacodyl  10 mg Rectal Once  . docusate sodium  100 mg Oral BID  . hydrocortisone cream   Topical BID  . loratadine  10 mg Oral Daily  . prenatal multivitamin  1 tablet Oral Q1200    A: [redacted]w[redacted]d with PPROM at 13 weeks, variable lie, bilateral clubfeet      Reassuring NST       3 hr GTT wnl     stable  P: Continue current plan of care      Upcoming tests/treatments:  - Use only  hypoallergenic sheet/linens Change T&S to weekly--next due 01/22/16 (ORDERED) -Next [place] 02/11/16      MDs will follow  [place], [place] 01/20/2016 1:51 PM   Seen and agreed 3 hour GTT normal

## 2016-01-20 NOTE — Progress Notes (Signed)
Called by RN to review last evening's FHRT. 1 1/2 min variable noted around 2244; resolved w/ position change. Monitoring continued x 1 hr s/p variable - none further. Tracing overall reassuring for this gestational age.   Farrel Gordon, CNM 01/19/16, 11 PM

## 2016-01-21 NOTE — Progress Notes (Addendum)
Hospital day # 33 pregnancy at [redacted]w[redacted]d--PPROM at 13 weeks, variable lie, ? Right clubfoot, left WNL  S:  Doing well, no issues.  Denies significant leaking at present.  Using hypoallergenic sheets with benefit.      Perception of contractions: None      Vaginal bleeding: None       Vaginal discharge:  No significant change  O: BP 107/69 mmHg  Pulse 78  Temp(Src) 97.9 F (36.6 C) (Oral)  Resp 18  Ht 5\' 9"  (1.753 m)  Wt 90.855 kg (200 lb 4.8 oz)  BMI 29.57 kg/m2  SpO2 92%  LMP 07/12/2015      Fetal tracings:  Reassuring, moderate variability      Contractions:   Occasional, mild      Uterus non-tender      Extremities: no significant edema and no signs of DVT          Labs:  3 hour GTT WNL 01/20/16       Meds:  . bisacodyl  10 mg Rectal Once  . docusate sodium  100 mg Oral BID  . hydrocortisone cream   Topical BID  . loratadine  10 mg Oral Daily  . prenatal multivitamin  1 tablet Oral Q1200    A: [redacted]w[redacted]d with PROM at 13 weeks, variable lie, ? Right clubfoot, left WNL      stable  P: Continue current plan of care      Upcoming tests/treatments:   Continue with hypoallergenic sheets      NST BID      T&S weekly (ORDERED 01/22/16)      [place] 02/11/16      MDs will follow  Donnel Saxon CNM, [place] 01/21/2016 7:28 AM

## 2016-01-22 NOTE — Progress Notes (Signed)
Hospital day # 34 pregnancy at [redacted]w[redacted]d--PPROM at 13 weeks, variable lie, ? Right clubfoot, left WNL.  S:  Doing well, reports slept last night.  Reports itching has resolved with change of sheets to hypoallergics       Using DVDs and books to pass the time.      Perception of contractions: None      Vaginal bleeding: None       Vaginal discharge:  No leaking at present  O: BP 108/72 mmHg  Pulse 90  Temp(Src) 98.2 F (36.8 C) (Oral)  Resp 16  Ht 5\' 9"  (1.753 m)  Wt 90.855 kg (200 lb 4.8 oz)  BMI 29.57 kg/m2  SpO2 92%  LMP 07/12/2015      Fetal tracings:  Category 1 on BID tracing      Contractions: None        Uterus non-tender      Extremities: no significant edema and no signs of DVT          Labs:  Refused T&S yesterday, due to recent 3 hour GTT.  Requests deferral for a few days       Meds:  . bisacodyl  10 mg Rectal Once  . docusate sodium  100 mg Oral BID  . hydrocortisone cream   Topical BID  . loratadine  10 mg Oral Daily  . prenatal multivitamin  1 tablet Oral Q1200   A: [redacted]w[redacted]d with PPROM at 13 weeks, variable lie, ? Right clubfoot, left WNL      Stable  P: Continue current plan of care      Upcoming tests/treatments:   Continue with hypoallergenic sheets NST BID T&S weekly (ORDERED TO RESTART 01/27/16, WILL DO PRN IF NEEDED)_ [place] 02/11/16       MDs will follow  Donnel Saxon CNM, [place] 01/22/2016 10:25 AM

## 2016-01-23 NOTE — Progress Notes (Signed)
Patient ID: Erica Roth, female   DOB: 08-18-1981, 35 y.o.   MRN: [id number] Pt without complaints.  No l VB.  Good FM.  Pt did have some leakage of fluid  BP 122/77 mmHg  Pulse 77  Temp(Src) 97 F (36.1 C) (Axillary)  Resp 20  Ht 5\' 9"  (1.753 m)  Wt 201 lb 11.2 oz (91.491 kg)  BMI 29.77 kg/m2  SpO2 92%  LMP 07/12/2015  FHTS Baseline: 140 and reassuring bpm  Toco none  Pt in NAD CV RRR Lungs CTAB abd  Gravid soft and NT GU no vb EXt no calf tenderness No results found for this or any previous visit (from the past 72 hour(s)).  Assessment and Plan [redacted]w[redacted]d  PPROM Pt stable no s/s of infection continue routine care

## 2016-01-24 NOTE — Progress Notes (Signed)
Hospital day # 36 pregnancy at [redacted]w[redacted]d--PPROM at 13 weeks, variable lie, ? Right clubfoot, left WNL.  S:  Resting, doing well      Perception of contractions: none      Vaginal bleeding: none       Vaginal discharge:  No leaking at present  O: BP 108/74 mmHg  Pulse 75  Temp(Src) 98.2 F (36.8 C) (Oral)  Resp 18  Ht 5\' 9"  (1.753 m)  Wt 91.491 kg (201 lb 11.2 oz)  BMI 29.77 kg/m2  SpO2 92%  LMP 07/12/2015      Fetal tracings:  Reassuring for ega , 145-150 bpm, moderate variability, rare variable       Contractions:   none      Uterus gravid and non-tender      Extremities: no significant edema and no signs of DVT          Labs:  No results found for this or any previous visit (from the past 24 hour(s)).        Meds:  . bisacodyl  10 mg Rectal Once  . docusate sodium  100 mg Oral BID  . hydrocortisone cream   Topical BID  . loratadine  10 mg Oral Daily  . prenatal multivitamin  1 tablet Oral Q1200    A: [redacted]w[redacted]d with PPROM at 13 weeks, variable lie, ? Right clubfoot, left WNL    NST  Reassuring   stable  P: Continue current plan of care      Upcoming tests/treatments: Continue with hypoallergenic sheets NST BID T&S weekly (ORDERED TO RESTART 01/27/16, WILL DO PRN IF NEEDED)_ [place] 02/11/16      MDs will follow  [place], [place] 01/24/2016 1:07 PM

## 2016-01-25 NOTE — Progress Notes (Signed)
Pt sleeping. 

## 2016-01-25 NOTE — Progress Notes (Addendum)
Hospital day # 37 pregnancy at [redacted]w[redacted]d--PPROM at 13 weeks.  S:  Perception of contractions: none      Vaginal bleeding: none now       Vaginal discharge:  no significant change  O: BP 88/48 mmHg  Pulse 79  Temp(Src) 98.2 F (36.8 C) (Oral)  Resp 17  Ht 5\' 9"  (1.753 m)  Wt 201 lb 11.2 oz (91.491 kg)  BMI 29.77 kg/m2  SpO2 100%  LMP 07/12/2015      Fetal tracings:150, moderate variability, + accel, occasional variable decel      Contractions:  none       Uterus gravid and non-tender      Extremities: no significant edema and no signs of DVT          Labs:  No results found for this or any previous visit (from the past 24 hour(s)).       Meds:  Current facility-administered medications:  .  acetaminophen (TYLENOL) tablet 650 mg, 650 mg, Oral, Q4H PRN, Lavetta Nielsen, CNM, 650 mg at 01/15/16 1131 .  bisacodyl (DULCOLAX) suppository 10 mg, 10 mg, Rectal, Once, Lavetta Nielsen, CNM, 10 mg at 12/27/15 2245 .  calcium carbonate (TUMS - dosed in mg elemental calcium) chewable tablet 400 mg of elemental calcium, 2 tablet, Oral, Q4H PRN, Lavetta Nielsen, CNM, 400 mg of elemental calcium at 01/21/16 2256 .  diphenhydrAMINE (BENADRYL) capsule 25 mg, 25 mg, Oral, Q6H PRN, Farrel Gordon, CNM, 25 mg at 01/18/16 1530 .  docusate sodium (COLACE) capsule 100 mg, 100 mg, Oral, BID, Gavin Pound, CNM, 100 mg at 01/18/16 0950 .  famotidine (PEPCID) tablet 20 mg, 20 mg, Oral, BID PRN, Donnel Saxon, CNM, 20 mg at 01/24/16 2218 .  GRX ANALGESIC BALM OINT 1 application, 1 application, Apply externally, PRN, Delsa Bern, MD, 1 application at [phone] .  hydrocortisone cream 1 %, , Topical, BID, Waymon Amato, MD .  hydrOXYzine (ATARAX/VISTARIL) tablet 25 mg, 25 mg, Oral, Q6H PRN, Thurnell Lose, MD .  loratadine (CLARITIN) tablet 10 mg, 10 mg, Oral, Daily, Thurnell Lose, MD, 10 mg at 01/19/16 1004 .  polyethylene glycol (MIRALAX / GLYCOLAX) packet 17 g, 17 g, Oral, Daily PRN, Naima Dillard, MD, 17 g at  12/29/15 1153 .  prenatal multivitamin tablet 1 tablet, 1 tablet, Oral, Q1200, Lavetta Nielsen, CNM, 1 tablet at 01/24/16 1022 .  zolpidem (AMBIEN) tablet 5 mg, 5 mg, Oral, QHS PRN, Lavetta Nielsen, CNM, 5 mg at 01/10/16 2248  A: [redacted]w[redacted]d with PPROM at 13 weeks     stable     Fetal tracings: reassured for GA     Contractions: none     Uterus non-tender      Extremities: DTR 1+, no clonus, no edema   P: Continue current plan of care      Upcoming tests/treatments:   NST BID     T&S weekly (ORDERED TO RESTART 01/27/16, WILL DO PRN IF NEEDED)_         [place] 02/11/16      MDs will follow    Venus Standard, CNM, MSN 01/25/2016. 5:59 PM   Patient seen and examined no signs of chorio.. Continue management as outlined above

## 2016-01-26 NOTE — Progress Notes (Addendum)
Hospital day # 38 pregnancy at [redacted]w[redacted]d--PPROM at 13 weeks.  S:  Perception of contractions: none      Vaginal bleeding: none now       Vaginal discharge:  no significant change  O: BP 102/52 mmHg  Pulse 75  Temp(Src) 97.9 F (36.6 C) (Oral)  Resp 16  Ht 5\' 9"  (1.753 m)  Wt 201 lb 11.2 oz (91.491 kg)  BMI 29.77 kg/m2  SpO2 99%  LMP 07/12/2015      Fetal tracings:140, moderate variability, + accel, no decel      Contractions:  none       Uterus gravid and non-tender      Extremities: no significant edema and no signs of DVT          Labs:  No results found for this or any previous visit (from the past 24 hour(s)).       Meds:  Current facility-administered medications:  .  acetaminophen (TYLENOL) tablet 650 mg, 650 mg, Oral, Q4H PRN, Lavetta Nielsen, CNM, 650 mg at 01/15/16 1131 .  bisacodyl (DULCOLAX) suppository 10 mg, 10 mg, Rectal, Once, Lavetta Nielsen, CNM, 10 mg at 12/27/15 2245 .  calcium carbonate (TUMS - dosed in mg elemental calcium) chewable tablet 400 mg of elemental calcium, 2 tablet, Oral, Q4H PRN, Lavetta Nielsen, CNM, 400 mg of elemental calcium at 01/21/16 2256 .  diphenhydrAMINE (BENADRYL) capsule 25 mg, 25 mg, Oral, Q6H PRN, Farrel Gordon, CNM, 25 mg at 01/18/16 1530 .  docusate sodium (COLACE) capsule 100 mg, 100 mg, Oral, BID, Gavin Pound, CNM, 100 mg at 01/18/16 0950 .  famotidine (PEPCID) tablet 20 mg, 20 mg, Oral, BID PRN, Donnel Saxon, CNM, 20 mg at 01/25/16 2052 .  GRX ANALGESIC BALM OINT 1 application, 1 application, Apply externally, PRN, Delsa Bern, MD, 1 application at [phone] .  hydrocortisone cream 1 %, , Topical, BID, Waymon Amato, MD .  hydrOXYzine (ATARAX/VISTARIL) tablet 25 mg, 25 mg, Oral, Q6H PRN, Thurnell Lose, MD .  loratadine (CLARITIN) tablet 10 mg, 10 mg, Oral, Daily, Thurnell Lose, MD, 10 mg at 01/19/16 1004 .  polyethylene glycol (MIRALAX / GLYCOLAX) packet 17 g, 17 g, Oral, Daily PRN, Naima Dillard, MD, 17 g at 12/29/15 1153 .   prenatal multivitamin tablet 1 tablet, 1 tablet, Oral, Q1200, Lavetta Nielsen, CNM, 1 tablet at 01/24/16 1022 .  zolpidem (AMBIEN) tablet 5 mg, 5 mg, Oral, QHS PRN, Lavetta Nielsen, CNM, 5 mg at 01/10/16 2248  A: [redacted]w[redacted]d with PPROM at 13 weeks     stable     Fetal tracings: Category 1     Contractions: none     Uterus non-tender      Extremities: DTR 1+, no clonus, no edema   P: Continue current plan of care      Upcoming tests/treatments:   NST BID  T&S weekly (ORDERED TO RESTART 01/27/16, WILL DO PRN IF NEEDED)_   [place] 02/11/16      MDs will follow  Venus Standard, CNM, MSN 01/26/2016. 11:34 AM  Patient seen and examined. She is without complaints..No signs or symptoms of chorioamnionitis. Continue current plan of care as outlined above

## 2016-01-26 NOTE — Progress Notes (Signed)
Pt sleeping. 

## 2016-01-27 MED ORDER — BETAMETHASONE SOD PHOS & ACET 6 (3-3) MG/ML IJ SUSP
12.0000 mg | INTRAMUSCULAR | Status: AC
  Administered 2016-01-27 – 2016-01-28 (×2): 12 mg via INTRAMUSCULAR
  Filled 2016-01-27 (×2): qty 2

## 2016-01-27 MED ORDER — BETAMETHASONE SOD PHOS & ACET 6 (3-3) MG/ML IJ SUSP
12.5000 mg | INTRAMUSCULAR | Status: DC
  Filled 2016-01-27: qty 2.1

## 2016-01-27 NOTE — Progress Notes (Addendum)
35 yo G1P0 at 39 3/7 weeks--HD# 40, PPROM at 13 weeks  Patient sleeping soundly.  No issues per RN report.  Filed Vitals:   01/26/16 2100 01/26/16 2207 01/26/16 2240 01/27/16 0758  BP:    101/61  Pulse:    67  Temp:    97.8 F (36.6 C)  TempSrc:    Oral  Resp: 16 16 16 15   Height:      Weight:      SpO2:       FHR Category 1 on NST BID Occasional mild UCs on NST  . bisacodyl  10 mg Rectal Once  . docusate sodium  100 mg Oral BID  . hydrocortisone cream   Topical BID  . loratadine  10 mg Oral Daily  . prenatal multivitamin  1 tablet Oral Q1200   T&S scheduled today.  Will see when patient awakens.  Donnel Saxon, CNM 01/27/16 9:15a  Addendum:  Now awake.  Doing well, no issues or concerns. Questions whether she will have a repeat course/dose of betamethasone.  Chest clear Heart RRR without murmur Abd gravid, NT No leaking at present Ext negative Homan's, no edema.  Plan: NST BID T&S weekly (due today)--per consult with Dr. Charlesetta Garibaldi, will d/c routine T&S.  Will do if becomes unstable. [place] for growth 02/11/16 Dr. Charlesetta Garibaldi will discuss patient with MFM.  [name] 01/27/16 11:20a  will give second dose of steroids and continue current care

## 2016-01-28 NOTE — Progress Notes (Signed)
Pt deferring VS and monitoring for 30 min, no complaints

## 2016-01-28 NOTE — Progress Notes (Addendum)
Hospital day # 40 pregnancy at [redacted]w[redacted]d-PPROM at 13 weeks-.  S:  Doing well, resting in bed.       Perception of contractions: none      Vaginal bleeding: none now       Vaginal discharge:  Occasional leaking  O: BP 108/66 mmHg  Pulse 66  Temp(Src) 97.7 F (36.5 C) (Oral)  Resp 17  Ht 5\' 9"  (1.753 m)  Wt 91.491 kg (201 lb 11.2 oz)  BMI 29.77 kg/m2  SpO2 99%  LMP 07/12/2015      Fetal tracings:  Reassuring, 140 bpm, moderate variability, +accels, occasional variables      Contractions:   irregular, every 1-3 minutes,  Unable to palpate, pt unable to perceive cntx      Uterus gravid and non-tender       Extremities: no significant edema and no signs of DVT          Labs:   No results found for this or any previous visit (from the past 24 hour(s)).        Meds: . bisacodyl  10 mg Rectal Once  . docusate sodium  100 mg Oral BID  . hydrocortisone cream   Topical BID  . loratadine  10 mg Oral Daily  . prenatal multivitamin  1 tablet Oral Q1200    A: [redacted]w[redacted]d with PPROM at 13 wks  -NST reassuring -irregular contractions unperceivable to patient      stable  P: Continue current plan of care       2nd Dose BMZ today       Encouraged increase fluid intake      Upcoming tests/treatments:  NST BID Weekly T&S discontinued, order prn  if pt becomes unstable. [place] for growth 02/11/16 MD's  to  discuss patient with MFM.      MDs will follow  [place], [place] 01/28/2016 1:22 PM   I saw and examined patient and agree with above findings, assessment and plan.  Dr. Alesia Richards.

## 2016-01-29 NOTE — Progress Notes (Signed)
Hospital day # 41 pregnancy at [redacted]w[redacted]d--PPROM at 13 weeks, completed 2nd course betamethasone 01/28/16  S:  Sleeping on both occasions that I have come by to see patient.  RN reports patient was up early, then went back to bed.      Per RN, patient reported ? Decreased FM overnight, but was aware of FM upon awakening and having breakfast, with Category 1 FHR on this am tracing.  O: BP 119/67 mmHg  Pulse 78  Temp(Src) 97.9 F (36.6 C) (Oral)  Resp 20  Ht 5\' 9"  (1.753 m)  Wt 90.538 kg (199 lb 9.6 oz)  BMI 29.46 kg/m2  SpO2 100%  LMP 07/12/2015      Fetal tracings:  Category 1 on BID tracing      Contractions:   Mild irritability, occasional UCs on monitor--RN reports patient unaware of UCs.      Uterus non-tender per report      Extremities: no significant edema and no signs of DVT          Labs:  No recent labs       Meds:  . bisacodyl  10 mg Rectal Once  . docusate sodium  100 mg Oral BID  . hydrocortisone cream   Topical BID  . loratadine  10 mg Oral Daily  . prenatal multivitamin  1 tablet Oral Q1200    A: [redacted]w[redacted]d with PPROM at 13 weeks     Stable  P: Continue current plan of care      Upcoming tests/treatments:   NST BID `     T&S prn if unstable      [place] for growth 02/11/16            MDs will follow  Donnel Saxon CNM, [place] 01/29/2016 1:46 PM

## 2016-01-30 ENCOUNTER — Inpatient Hospital Stay (HOSPITAL_COMMUNITY): Payer: Medicaid Other

## 2016-01-30 NOTE — Progress Notes (Signed)
Hospital day # 42 pregnancy at [redacted]w[redacted]d--PPROM at 13 weeks.  S:  Perception of contractions: none      Vaginal bleeding: none now       Vaginal discharge:  no significant change      Pt c/o decrease fetal activity      Nurses express pt is feels restricted to the room  O: BP 103/48 mmHg  Pulse 67  Temp(Src) 98 F (36.7 C) (Oral)  Resp 20  Ht 5\' 9"  (1.753 m)  Wt 199 lb 9.6 oz (90.538 kg)  BMI 29.46 kg/m2  SpO2 100%  LMP 07/12/2015      Fetal tracings: 150, min-mod variability, + accel, occasional variable decels      Contractions:  none       Uterus gravid and non-tender      Extremities: no significant edema and no signs of DVT          Labs:  No results found for this or any previous visit (from the past 24 hour(s)).       Meds:  Current facility-administered medications:  .  acetaminophen (TYLENOL) tablet 650 mg, 650 mg, Oral, Q4H PRN, Lavetta Nielsen, CNM, 650 mg at 01/15/16 1131 .  bisacodyl (DULCOLAX) suppository 10 mg, 10 mg, Rectal, Once, Lavetta Nielsen, CNM, 10 mg at 12/27/15 2245 .  calcium carbonate (TUMS - dosed in mg elemental calcium) chewable tablet 400 mg of elemental calcium, 2 tablet, Oral, Q4H PRN, Lavetta Nielsen, CNM, 400 mg of elemental calcium at 01/21/16 2256 .  diphenhydrAMINE (BENADRYL) capsule 25 mg, 25 mg, Oral, Q6H PRN, Farrel Gordon, CNM, 25 mg at 01/18/16 1530 .  docusate sodium (COLACE) capsule 100 mg, 100 mg, Oral, BID, Gavin Pound, CNM, 100 mg at 01/18/16 0950 .  famotidine (PEPCID) tablet 20 mg, 20 mg, Oral, BID PRN, Donnel Saxon, CNM, 20 mg at 01/29/16 2302 .  GRX ANALGESIC BALM OINT 1 application, 1 application, Apply externally, PRN, Delsa Bern, MD, 1 application at 0000000 1615 .  hydrocortisone cream 1 %, , Topical, BID, Waymon Amato, MD .  hydrOXYzine (ATARAX/VISTARIL) tablet 25 mg, 25 mg, Oral, Q6H PRN, Thurnell Lose, MD .  loratadine (CLARITIN) tablet 10 mg, 10 mg, Oral, Daily, Thurnell Lose, MD, 10 mg at 01/19/16 1004 .  polyethylene glycol  (MIRALAX / GLYCOLAX) packet 17 g, 17 g, Oral, Daily PRN, Naima Dillard, MD, 17 g at 12/29/15 1153 .  prenatal multivitamin tablet 1 tablet, 1 tablet, Oral, Q1200, Lavetta Nielsen, CNM, 1 tablet at 01/29/16 [id number] .  zolpidem (AMBIEN) tablet 5 mg, 5 mg, Oral, QHS PRN, Lavetta Nielsen, CNM, 5 mg at 01/10/16 2248  A: [redacted]w[redacted]d with PPROM at 13 weeks     stable     Fetal tracings: Category 2     Contractions: none     Uterus non-tender      Extremities: DTR 1+, no clonus, no edema     BMZ 2nd course complete  P: Continue current plan of care      Upcoming tests/treatments:  [place] 02/11/16      MDs will follow      [place] BPP/AFI      If [place] is ok pt may have a wheel chair ride QD   Dinh Ayotte, CNM, MSN 01/30/2016. 9:01 AM

## 2016-01-31 NOTE — Progress Notes (Addendum)
Hospital day # 43 pregnancy at [redacted]w[redacted]d--PPROM at 13 weeks..  S:        Perception of contractions: none      Vaginal bleeding: none        Vaginal discharge:  Increased leaking of fluids since last night  O: BP 109/61 mmHg  Pulse 74  Temp(Src) 97.9 F (36.6 C) (Oral)  Resp 16  Ht 5\' 9"  (1.753 m)  Wt 90.538 kg (199 lb 9.6 oz)  BMI 29.46 kg/m2  SpO2 100%  LMP 07/12/2015      Fetal tracings:   Reassuring,  145 bpm, min-moderate variability, +accels, no decels      Contractions:   none      Uterus gravid and non-tender      Extremities: no significant edema and no signs of DVT          Labs:   No results found for this or any previous visit (from the past 24 hour(s)).        Meds:  . bisacodyl  10 mg Rectal Once  . docusate sodium  100 mg Oral BID  . hydrocortisone cream   Topical BID  . loratadine  10 mg Oral Daily  . 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    -Reassuring NST    - BMZ 2nd course completed     stable  P: Continue current plan of care      Upcoming tests/treatments:    -NST TID -[place] 02/11/16   Growth/AFI   MDs will follow  Lavetta Nielsen CNM, [place] 01/31/2016 10:44 AM   I note on last ultrasound 01/30/16, fetus with transverse presentation.  Dr. Alesia Richards.

## 2016-02-01 NOTE — Progress Notes (Signed)
Hospital day # 44 pregnancy at [redacted]w[redacted]d--PPROM at 13 weeks..  S:  Pt resting comfortably in bed, reports no acute complaints.  +FM.  No F/C/CP/SOB.      Perception of contractions: none      Vaginal bleeding: none        Vaginal discharge:  Some leaking last night, but none so far this am  O: BP 118/72 mmHg  Pulse 79  Temp(Src) 98.3 F (36.8 C) (Oral)  Resp 16  Ht 5\' 9"  (1.753 m)  Wt 90.538 kg (199 lb 9.6 oz)  BMI 29.46 kg/m2  SpO2 100%  LMP 07/12/2015      Fetal tracings:   Reassuring,  150, moderate variability, + accels, no decels      Contractions:   None      CV: RRR Lungs: CTAB      Uterus gravid and non-tender      Extremities: no significant edema and no signs of DVT          Labs:  CBC    Component Value Date/Time   WBC 6.5 01/17/2016 1241   RBC 3.68* 01/17/2016 1241   HGB 11.1* 01/17/2016 1241   HCT 32.7* 01/17/2016 1241   PLT 225 01/17/2016 1241   MCV 88.9 01/17/2016 1241   MCH 30.2 01/17/2016 1241   MCHC 33.9 01/17/2016 1241   RDW 13.6 01/17/2016 1241   LYMPHSABS 2.4 09/18/2015 1749   MONOABS 0.5 09/18/2015 1749   EOSABS 0.2 09/18/2015 1749   BASOSABS 0.0 09/18/2015 1749          Meds:  . bisacodyl  10 mg Rectal Once  . docusate sodium  100 mg Oral BID  . hydrocortisone cream   Topical BID  . loratadine  10 mg Oral Daily  . 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    -Reassuring fetal well being, Cat. I tracing    - BMZ 2nd course completed     stable  P: Continue current plan of care      Upcoming tests/treatments:    -NST TID -[place] 02/11/16   Growth/AFI  Janyth Pupa, DO [phone] (pager) [phone] (office)

## 2016-02-02 NOTE — Progress Notes (Signed)
End of daylight saving time.

## 2016-02-02 NOTE — Progress Notes (Signed)
Hospital day # 45 pregnancy at [redacted]w[redacted]d--PPROM at 13 weeks..  S:  Pt resting comfortably in bed, reports no acute complaints.  +FM.  No F/C/CP/SOB.      Perception of contractions: none- does report rare/occasional tightening- not painful can't remember the last time it happened      Vaginal bleeding: none        Vaginal discharge:  Denies recent leakage  O: BP 109/66 mmHg  Pulse 67  Temp(Src) 97.8 F (36.6 C) (Oral)  Resp 16  Ht 5\' 9"  (1.753 m)  Wt 90.538 kg (199 lb 9.6 oz)  BMI 29.46 kg/m2  SpO2 100%  LMP 07/12/2015      Fetal tracings:   Reassuring,  150, moderate variability, + accels, no decels      Contractions:   None      CV: RRR Lungs: CTAB      Uterus gravid and non-tender      Extremities: no significant edema and no signs of DVT          Labs:  CBC    Component Value Date/Time   WBC 6.5 01/17/2016 1241   RBC 3.68* 01/17/2016 1241   HGB 11.1* 01/17/2016 1241   HCT 32.7* 01/17/2016 1241   PLT 225 01/17/2016 1241   MCV 88.9 01/17/2016 1241   MCH 30.2 01/17/2016 1241   MCHC 33.9 01/17/2016 1241   RDW 13.6 01/17/2016 1241   LYMPHSABS 2.4 09/18/2015 1749   MONOABS 0.5 09/18/2015 1749   EOSABS 0.2 09/18/2015 1749   BASOSABS 0.0 09/18/2015 1749          Meds:  . bisacodyl  10 mg Rectal Once  . docusate sodium  100 mg Oral BID  . hydrocortisone cream   Topical BID  . loratadine  10 mg Oral Daily  . 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    -Reassuring fetal well being    - BMZ 2nd course completed     stable  P: Continue current plan of care      Upcoming tests/treatments:    -NST TID -[place] 02/11/16   Growth/AFI  Janyth Pupa, DO [phone] (pager) [phone]  (office)

## 2016-02-03 NOTE — Progress Notes (Signed)
Hospital day # 46 pregnancy at [redacted]w[redacted]d--PPROM at 13 weeks.  S:  Resting well, reports no issues or questions at this time       Perception of contractions: none      Vaginal bleeding: none        Vaginal discharge:  no significant change  O: BP 104/51 mmHg  Pulse 70  Temp(Src) 98.1 F (36.7 C) (Oral)  Resp 15  Ht 5\' 9"  (1.753 m)  Wt 90.538 kg (199 lb 9.6 oz)  BMI 29.46 kg/m2  SpO2 100%  LMP 07/12/2015      Fetal tracings:  Reassuring, 145 bpm, moderate variability, +accel, occasional variable decels      Contractions:   None, uterine irritability noted, not perceived by patient, not palpable      Uterus gravid and non-tender      Extremities: no significant edema and no signs of DVT          Labs:  No results found for this or any previous visit (from the past 24 hour(s)).       Meds:  . bisacodyl  10 mg Rectal Once  . docusate sodium  100 mg Oral BID  . hydrocortisone cream   Topical BID  . loratadine  10 mg Oral Daily  . prenatal multivitamin  1 tablet Oral Q1200    A: [redacted]w[redacted]d with PPROM at 13 weeks    -Reassuring fetal well being  - BMZ 2nd course completed     stable  P: Continue current plan of care      Upcoming tests/treatments:  NST TID -[place] 3/21/17Growth/AFI      MDs will follow  [place], [place] 02/03/2016 11:35 AM

## 2016-02-04 NOTE — Progress Notes (Signed)
Hospital day # 47 pregnancy at [redacted]w[redacted]d--PPROM at 13 weeks.  S:  Doing well, denies any issues.      Perception of contractions: none      Vaginal bleeding: None       Vaginal discharge:  None  O: BP 116/63 mmHg  Pulse 82  Temp(Src) 98.2 F (36.8 C) (Oral)  Resp 16  Ht 5\' 9"  (1.753 m)  Wt 90.538 kg (199 lb 9.6 oz)  BMI 29.46 kg/m2  SpO2 100%  LMP 07/12/2015      Fetal tracings: Category 1 on TID monitoring      Contractions:   Occasional, mild      Uterus non-tender      Extremities: no significant edema and no signs of DVT          Labs:  None       Meds:  . bisacodyl  10 mg Rectal Once  . docusate sodium  100 mg Oral BID  . hydrocortisone cream   Topical BID  . loratadine  10 mg Oral Daily  . prenatal multivitamin  1 tablet Oral Q1200    A: [redacted]w[redacted]d with PPROM at 13 weeks     Stable     S/p betamethasone course x 2  P: Continue current plan of care      Upcoming tests/treatments:  [place] 02/11/16 for growth/AFI      NST TID      T&S prn if becomes unstable.      MDs will follow  Donnel Saxon CNM, [place] 02/04/2016 3:10 PM

## 2016-02-04 NOTE — Progress Notes (Signed)
Patient sleeping soundly.  Will see when she awakens.  Donnel Saxon, CNM 02/04/16 11:30a

## 2016-02-04 NOTE — Progress Notes (Signed)
In room to assess.  Patient asleep in bed with covers over head.  Provider did not attempt to disrupt.  Per nurse tech, patient awakens at 0600 to pray and promptly returns back to bed until midday.  Will attempt to assess later.  Maryann Conners MSN, CNM 02/04/2016 9:06 AM

## 2016-02-05 NOTE — Progress Notes (Signed)
Hospital day # 48 pregnancy at [redacted]w[redacted]d--PPROM at 13 weeks.  S:  Perception of contractions: none      Vaginal bleeding: none now       Vaginal discharge:  no significant change  O: BP 100/51 mmHg  Pulse 86  Temp(Src) 98 F (36.7 C) (Oral)  Resp 16  Ht 5\' 9"  (1.753 m)  Wt 199 lb 9.6 oz (90.538 kg)  BMI 29.46 kg/m2  SpO2 100%  LMP 07/12/2015      Fetal tracings: 02/04/16 2300, 155, moderate variability, + accel, no decel      Contractions:  none       Uterus gravid and non-tender      Extremities: no significant edema and no signs of DVT          Labs:  No results found for this or any previous visit (from the past 24 hour(s)).       Meds:  Current facility-administered medications:  .  acetaminophen (TYLENOL) tablet 650 mg, 650 mg, Oral, Q4H PRN, Lavetta Nielsen, CNM, 650 mg at 01/15/16 1131 .  bisacodyl (DULCOLAX) suppository 10 mg, 10 mg, Rectal, Once, Lavetta Nielsen, CNM, 10 mg at 12/27/15 2245 .  calcium carbonate (TUMS - dosed in mg elemental calcium) chewable tablet 400 mg of elemental calcium, 2 tablet, Oral, Q4H PRN, Lavetta Nielsen, CNM, 400 mg of elemental calcium at 01/21/16 2256 .  diphenhydrAMINE (BENADRYL) capsule 25 mg, 25 mg, Oral, Q6H PRN, Farrel Gordon, CNM, 25 mg at 01/18/16 1530 .  docusate sodium (COLACE) capsule 100 mg, 100 mg, Oral, BID, Gavin Pound, CNM, 100 mg at 02/03/16 1716 .  famotidine (PEPCID) tablet 20 mg, 20 mg, Oral, BID PRN, Donnel Saxon, CNM, 20 mg at 02/04/16 2039 .  GRX ANALGESIC BALM OINT 1 application, 1 application, Apply externally, PRN, Delsa Bern, MD, 1 application at 0000000 1615 .  hydrocortisone cream 1 %, , Topical, BID, Waymon Amato, MD .  hydrOXYzine (ATARAX/VISTARIL) tablet 25 mg, 25 mg, Oral, Q6H PRN, Thurnell Lose, MD .  loratadine (CLARITIN) tablet 10 mg, 10 mg, Oral, Daily, Thurnell Lose, MD, 10 mg at 01/19/16 1004 .  polyethylene glycol (MIRALAX / GLYCOLAX) packet 17 g, 17 g, Oral, Daily PRN, Naima Dillard, MD, 17 g at 12/29/15  1153 .  prenatal multivitamin tablet 1 tablet, 1 tablet, Oral, Q1200, Lavetta Nielsen, CNM, 1 tablet at 02/04/16 1008 .  zolpidem (AMBIEN) tablet 5 mg, 5 mg, Oral, QHS PRN, Lavetta Nielsen, CNM, 5 mg at 01/10/16 2248  A: [redacted]w[redacted]d with PPROM zt 13 weeks     stable     Fetal tracings: Category 1     Contractions: none     Uterus non-tender      Extremities: DTR 1+, no clonus, no edema   P: Continue current plan of care      Upcoming tests/treatments:   [place] 02/11/16 for growth/AFI NST TID T&S prn if becomes unstable.      MDs will follow   Zarina Pe, CNM, MSN 02/05/2016. 9:51 AM

## 2016-02-06 NOTE — Progress Notes (Signed)
Subjective: Strip and Chart review  Objective: BP 114/74 mmHg  Pulse 80  Temp(Src) 97.7 F (36.5 C) (Oral)  Resp 16  Ht 5\' 9"  (1.753 m)  Wt 89.767 kg (197 lb 14.4 oz)  BMI 29.21 kg/m2  SpO2 100%  LMP 07/12/2015      FHT: Reassuring, 150 bpm, moderate variability, +accels, rare variable  (2200 on 02/05/16) UC:  none  Assessment:  [redacted]w[redacted]d with PPROM at 13 weeks  stable  reassuring NST     Plan: Continue current plan of care  Upcoming tests/treatments: [place] 02/11/16 for growth/AFI NST TID T&S prn if becomes unstable.  MDs will follow  [place], [place] 02/06/2016, 4:32 AM

## 2016-02-06 NOTE — Progress Notes (Signed)
Patient ID: Erica Roth, female   DOB: Apr 10, 1981, 35 y.o.   MRN: [id number] Pt without complaints.  No leakage of fluid or VB.  Good FM  BP 112/64 mmHg  Pulse 80  Temp(Src) 97.9 F (36.6 C) (Oral)  Resp 16  Ht 5\' 9"  (1.753 m)  Wt 197 lb 14.4 oz (89.767 kg)  BMI 29.21 kg/m2  SpO2 100%  LMP 07/12/2015  FHTS 150s and reactive  Toco none  Pt in NAD CV RRR Lungs CTAB abd  Gravid soft and NT GU no vb EXt no calf tenderness No results found for this or any previous visit (from the past 72 hour(s)).  Assessment and Plan [redacted]w[redacted]d  PPROM Pt stable No s/s of infection Continue current care

## 2016-02-07 NOTE — Progress Notes (Signed)
[place], [place], [id number] HD # 50  Subjective:  Patient reports occasional leakage of fluid, clear- yellow in color.  She reports good fetal movement.  She denies abdominal cramping or contractions, denies fevers or chills or vaginal bleeding.     Objective: I have reviewed patient's vital signs. Filed Vitals:   02/06/16 2250 02/07/16 0746 02/07/16 1229 02/07/16 1625  BP:  115/67  109/66  Pulse:  74  94  Temp:  97.7 F (36.5 C)  97.6 F (36.4 C)  TempSrc:  Oral  Oral  Resp: 16 20 20 18   Height:      Weight:      SpO2:        General: alert, cooperative and no distress Resp: clear to auscultation bilaterally Cardio: regular rate and rhythm, S1, S2 normal, no murmur, click, rub or gallop GI: soft, non-tender; bowel sounds normal; no masses,  no organomegaly Extremities: extremities normal, atraumatic, no cyanosis or edema and no edema, redness or tenderness in the calves or thighs NST 3/17 @ 9am: 140 baseline, mod variability, Reactive.  TOCO: No contractions.    Assessment/Plan: 67 W EGA IUP, with PPROM since 14 weeks, s/p 2 courses of steroids for fetal lung maturity, stable  -Variable fetal lie -Continue with current care -NST Q shift - For Growth/AFI [place] on 3/21. -All questions answered at bedside.    LOS: 50 days    [facility] 02/07/2016, 4:40 PM

## 2016-02-07 NOTE — Progress Notes (Signed)
Subjective: Chart and Strip review  Objective: BP 118/60 mmHg  Pulse 99  Temp(Src) 97.9 F (36.6 C) (Oral)  Resp 16  Ht 5\' 9"  (1.753 m)  Wt 89.767 kg (197 lb 14.4 oz)  BMI 29.21 kg/m2  SpO2 100%  LMP 07/12/2015     FHT: Reassuring NST, 150 bpm, moderate variability, +accels, no decels UC:   none   Assessment:  30wks with PPROM at 13 weeks  stable  reassuring NST  Plan:  Continue current plan of care  Upcoming tests/treatments: [place] 02/11/16 for growth/AFI NST TID T&S prn if becomes unstable.  MDs will follow  [place], [place] 02/07/2016, 4:56 AM

## 2016-02-08 MED ORDER — BISACODYL 10 MG RE SUPP
10.0000 mg | Freq: Once | RECTAL | Status: AC
  Administered 2016-02-08: 10 mg via RECTAL
  Filled 2016-02-08 (×2): qty 1

## 2016-02-08 NOTE — Progress Notes (Signed)
Hospital day # 51 pregnancy at [redacted]w[redacted]d--PPROM at 13 weeks.  S:  Perception of contractions: none      Vaginal bleeding: none now       Vaginal discharge:  no significant change  O: BP 109/64 mmHg  Pulse 82  Temp(Src) 97.8 F (36.6 C) (Oral)  Resp 16  Ht 5\' 9"  (1.753 m)  Wt 197 lb 14.4 oz (89.767 kg)  BMI 29.21 kg/m2  SpO2 100%  LMP 07/12/2015      Fetal tracings:150, moderate variability,, + accel, occasional variable decels      Contractions:  none       Uterus gravid and non-tender      Extremities: no significant edema and no signs of DVT          Labs:  No results found for this or any previous visit (from the past 24 hour(s)).       Meds:  Current facility-administered medications:  .  acetaminophen (TYLENOL) tablet 650 mg, 650 mg, Oral, Q4H PRN, Lavetta Nielsen, CNM, 650 mg at 01/15/16 1131 .  bisacodyl (DULCOLAX) suppository 10 mg, 10 mg, Rectal, Once, Lavetta Nielsen, CNM, 10 mg at 12/27/15 2245 .  calcium carbonate (TUMS - dosed in mg elemental calcium) chewable tablet 400 mg of elemental calcium, 2 tablet, Oral, Q4H PRN, Lavetta Nielsen, CNM, 400 mg of elemental calcium at 01/21/16 2256 .  diphenhydrAMINE (BENADRYL) capsule 25 mg, 25 mg, Oral, Q6H PRN, Farrel Gordon, CNM, 25 mg at 01/18/16 1530 .  docusate sodium (COLACE) capsule 100 mg, 100 mg, Oral, BID, Gavin Pound, CNM, 100 mg at 02/07/16 2257 .  famotidine (PEPCID) tablet 20 mg, 20 mg, Oral, BID PRN, Donnel Saxon, CNM, 20 mg at 02/06/16 2253 .  GRX ANALGESIC BALM OINT 1 application, 1 application, Apply externally, PRN, Delsa Bern, MD, 1 application at 0000000 1615 .  hydrocortisone cream 1 %, , Topical, BID, Waymon Amato, MD .  hydrOXYzine (ATARAX/VISTARIL) tablet 25 mg, 25 mg, Oral, Q6H PRN, Thurnell Lose, MD .  loratadine (CLARITIN) tablet 10 mg, 10 mg, Oral, Daily, Thurnell Lose, MD, 10 mg at 01/19/16 1004 .  polyethylene glycol (MIRALAX / GLYCOLAX) packet 17 g, 17 g, Oral, Daily PRN, Naima Dillard, MD, 17 g at  12/29/15 1153 .  prenatal multivitamin tablet 1 tablet, 1 tablet, Oral, Q1200, Lavetta Nielsen, CNM, 1 tablet at 02/07/16 0957 .  zolpidem (AMBIEN) tablet 5 mg, 5 mg, Oral, QHS PRN, Lavetta Nielsen, CNM, 5 mg at 01/10/16 2248  A: [redacted]w[redacted]d with PPROM at 13 weeks     stable     Fetal tracings: gestational appropriate     Contractions: none     Uterus non-tender      Extremities: DTR 1+, no clonus, no edema     BMZ x2 courses  P: Continue current plan of care      Upcoming tests/treatments:   [place] 02/11/16 for growth/AFI NST TID T&S prn if becomes unstable      MDs will follow  Gennifer Potenza, CNM, MSN 02/08/2016. 11:36 AM

## 2016-02-09 NOTE — Progress Notes (Addendum)
Hospital day # 52 pregnancy at [redacted]w[redacted]d--PPROM at 13 weeks.  S:  Perception of contractions: none      Vaginal bleeding: none now       Vaginal discharge:  no significant change  O: BP 110/74 mmHg  Pulse 79  Temp(Src) 98.1 F (36.7 C) (Oral)  Resp 18  Ht 5\' 9"  (1.753 m)  Wt 197 lb 14.4 oz (89.767 kg)  BMI 29.21 kg/m2  SpO2 100%  LMP 07/12/2015      Fetal tracings:155, moderate variability + accel no decel      Contractions:  none       Uterus gravid and non-tender      Extremities: no significant edema and no signs of DVT          Labs:  No results found for this or any previous visit (from the past 24 hour(s)).       Meds:  Current facility-administered medications:  .  acetaminophen (TYLENOL) tablet 650 mg, 650 mg, Oral, Q4H PRN, Lavetta Nielsen, CNM, 650 mg at 02/08/16 1650 .  calcium carbonate (TUMS - dosed in mg elemental calcium) chewable tablet 400 mg of elemental calcium, 2 tablet, Oral, Q4H PRN, Lavetta Nielsen, CNM, 400 mg of elemental calcium at 01/21/16 2256 .  diphenhydrAMINE (BENADRYL) capsule 25 mg, 25 mg, Oral, Q6H PRN, Farrel Gordon, CNM, 25 mg at 01/18/16 1530 .  docusate sodium (COLACE) capsule 100 mg, 100 mg, Oral, BID, Gavin Pound, CNM, 100 mg at 02/09/16 1030 .  famotidine (PEPCID) tablet 20 mg, 20 mg, Oral, BID PRN, Donnel Saxon, CNM, 20 mg at 02/08/16 2207 .  GRX ANALGESIC BALM OINT 1 application, 1 application, Apply externally, PRN, Delsa Bern, MD, 1 application at 0000000 1615 .  hydrocortisone cream 1 %, , Topical, BID, Waymon Amato, MD .  hydrOXYzine (ATARAX/VISTARIL) tablet 25 mg, 25 mg, Oral, Q6H PRN, Thurnell Lose, MD .  loratadine (CLARITIN) tablet 10 mg, 10 mg, Oral, Daily, Thurnell Lose, MD, 10 mg at 01/19/16 1004 .  polyethylene glycol (MIRALAX / GLYCOLAX) packet 17 g, 17 g, Oral, Daily PRN, Naima Dillard, MD, 17 g at 12/29/15 1153 .  prenatal multivitamin tablet 1 tablet, 1 tablet, Oral, Q1200, Lavetta Nielsen, CNM, 1 tablet at 02/09/16 1029 .   zolpidem (AMBIEN) tablet 5 mg, 5 mg, Oral, QHS PRN, Lavetta Nielsen, CNM, 5 mg at 01/10/16 2248  A: [redacted]w[redacted]d with PPROM at 13 weeks     stable     Fetal tracings: gestationally appropriate     Contractions: none     Uterus non-tender      Extremities: DTR 1+, no clonus, no edema       P: Continue current plan of care      Upcoming tests/treatments:   [place] 02/11/16 for growth/AFI NST TID T&S prn if becomes unstable      MDs will follow  Erica Roth, CNM, MSN 02/09/2016. 10:59 AM  Seen and agreed

## 2016-02-10 NOTE — Progress Notes (Signed)
Pt denies feeling ctxs, RN observed pt sleeping during ctxs as well.

## 2016-02-10 NOTE — Progress Notes (Addendum)
Hospital day # 53 pregnancy at [redacted]w[redacted]d--PPROM at 13 weeks.  S:  Perception of contractions: none      Vaginal bleeding: none now       Vaginal discharge:  no significant change  O: BP 109/60 mmHg  Pulse 79  Temp(Src) 98 F (36.7 C) (Oral)  Resp 20  Ht 5\' 9"  (1.753 m)  Wt 197 lb 14.4 oz (89.767 kg)  BMI 29.21 kg/m2  SpO2 100%  LMP 07/12/2015      Fetal tracings:150, moderate variability, + accel, no decels      Contractions:  3-4 minutes, not felt by pt      Uterus gravid and non-tender      Extremities: no significant edema and no signs of DVT          Labs:  No results found for this or any previous visit (from the past 24 hour(s)).       Meds:  Current facility-administered medications:  .  acetaminophen (TYLENOL) tablet 650 mg, 650 mg, Oral, Q4H PRN, Lavetta Nielsen, CNM, 650 mg at 02/08/16 1650 .  calcium carbonate (TUMS - dosed in mg elemental calcium) chewable tablet 400 mg of elemental calcium, 2 tablet, Oral, Q4H PRN, Lavetta Nielsen, CNM, 400 mg of elemental calcium at 01/21/16 2256 .  diphenhydrAMINE (BENADRYL) capsule 25 mg, 25 mg, Oral, Q6H PRN, Farrel Gordon, CNM, 25 mg at 01/18/16 1530 .  docusate sodium (COLACE) capsule 100 mg, 100 mg, Oral, BID, Gavin Pound, CNM, 100 mg at 02/09/16 2242 .  famotidine (PEPCID) tablet 20 mg, 20 mg, Oral, BID PRN, Donnel Saxon, CNM, 20 mg at 02/09/16 2242 .  GRX ANALGESIC BALM OINT 1 application, 1 application, Apply externally, PRN, Delsa Bern, MD, 1 application at 0000000 [phone] .  hydrocortisone cream 1 %, , Topical, BID, Waymon Amato, MD .  hydrOXYzine (ATARAX/VISTARIL) tablet 25 mg, 25 mg, Oral, Q6H PRN, Thurnell Lose, MD .  loratadine (CLARITIN) tablet 10 mg, 10 mg, Oral, Daily, Thurnell Lose, MD, 10 mg at 01/19/16 1004 .  polyethylene glycol (MIRALAX / GLYCOLAX) packet 17 g, 17 g, Oral, Daily PRN, [place] Gan, MD, 17 g at 12/29/15 1153 .  prenatal multivitamin tablet 1 tablet, 1 tablet, Oral, Q1200, Lavetta Nielsen, CNM, 1 tablet  at 02/09/16 1029 .  zolpidem (AMBIEN) tablet 5 mg, 5 mg, Oral, QHS PRN, Lavetta Nielsen, CNM, 5 mg at 01/10/16 2248  A: [redacted]w[redacted]d with PPROM at 13 weeks     stable     Fetal tracings: Category 1     Contractions: 3-4     Uterus non-tender      Extremities: DTR 1+, no clonus, no edema  P: Continue current plan of care      Upcoming tests/treatments:   [place] 02/11/16 for growth/AFI NST TID T&S prn if becomes unstable        MDs will follow  Venus Standard, CNM, MSN 02/10/2016. 11:22 AM  Pt seen and examined.  Agree with above

## 2016-02-11 ENCOUNTER — Inpatient Hospital Stay (HOSPITAL_COMMUNITY): Payer: Medicaid Other

## 2016-02-11 NOTE — Progress Notes (Signed)
Hospital day # 54 pregnancy at [redacted]w[redacted]d--PPROM at 13 weeks, completed 2 courses of betamethasone.  S:  Doing well, no complaints.  Just returned from US--BPP 6/8.  Placed on EFM.      Perception of contractions: None      Vaginal bleeding: None       Vaginal discharge:  None  O: BP 108/65 mmHg  Pulse 69  Temp(Src) 98 F (36.7 C) (Oral)  Resp 20  Ht 5\' 9"  (1.753 m)  Wt 89.767 kg (197 lb 14.4 oz)  BMI 29.21 kg/m2  SpO2 100%  LMP 07/12/2015      Fetal tracings:  Reassuring, but not fully reactive at present.  Patient has not eaten yet.      Contractions:   Mild irritability      Uterus non-tender      Extremities: no significant edema and no signs of DVT          Labs:  None--last T&S 01/15/16  [place] today:  Transverse, head to maternal right.  Anterior placenta.  AFI 11.97, 30%ile.  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  Biometric parameters: HC 9%ile, AC 14%ile, FL < 3%ile, humerus 7%ile.  (Previous US 01/14/16--EFW 2+1, 50%ile, AFI 11.71, 24%ile, anterior LUS fibroid 5 x 7 x 5)       Meds:  . docusate sodium  100 mg Oral BID  . hydrocortisone cream   Topical BID  . loratadine  10 mg Oral Daily  . prenatal multivitamin  1 tablet Oral Q1200    A: [redacted]w[redacted]d with PPROM at 13 weeks.     Stable     BPP 6/8 today    Transverse lie     Large LUS fibroid      EFW 24%ile  P: Continue current plan of care      Upcoming tests/treatments:  Continuous EFM at present to assess for reactivity.  Patient to eat breakfast, push fluids.      MDs will follow  Erica Roth CNM, [place] 02/11/2016 9:28 AM   Addendum: FHR Category 1, with reactivity, minimal irritability now.  BPP now 8/10.  Reviewed US findings with patient. Discussed fetal position, presence of fibroid, level of fluid, and initial BPP, with modification due to reactive tracing.  Will continue to observe.  Will update Dr. Raphael Gibney.  Erica Roth, CNM 02/11/16  11a

## 2016-02-11 NOTE — Progress Notes (Signed)
Subjective: Strip and Chart review Objective: BP 114/64 mmHg  Pulse 82  Temp(Src) 98.1 F (36.7 C) (Oral)  Resp 18  Ht 5\' 9"  (1.753 m)  Wt 89.767 kg (197 lb 14.4 oz)  BMI 29.21 kg/m2  SpO2 100%  LMP 07/12/2015     FHT: Cat 1 , 150 bpm, moderate variability, +accels   (02/10/16 at 2240) UC:   none   Assessment:  [redacted]w[redacted]d with PPROM at 13 weeks  stable  Fetal tracings: Cat 1   Contractions: none    Plan: Continue current plan of care  Upcoming tests/treatments:  [place] 02/11/16 for growth/AFI NST TID T&S prn if becomes unstable  MDs will follow  Erica Roth CNM, MSN 02/11/2016, 2:50 AM

## 2016-02-11 NOTE — Progress Notes (Signed)
PROGRESS NOTE  I have reviewed the patient's vital signs, labs, and notes. I have examined the patient. I agree with the previous note from the Certified Nurse Midwife.  MFM [place]: Transverse lie, normal fluid, BPP 6/8. Recommend in hospital care.  NST: Cat 1 (therefore BPP 8/10).  Gildardo Cranker, M.D. 02/11/2016

## 2016-02-12 NOTE — Progress Notes (Signed)
Hospital day # 55 pregnancy at [redacted]w[redacted]d--PPROM at 13 weeks..  S:  Sleeping at intervals      Perception of contractions: None      Vaginal bleeding: None       Vaginal discharge:  None       Patient questions if she would be allowed to go past 34 weeks--advised patient that is up to MD consultation at that time.      Patient questions mode of delivery--advised her it would depend on presentation of fetus at 41 weeks (or time of      decision-making for delivery).  Discussed that fetal position would need to be vtx for induction to be considered.  O: BP 105/70 mmHg  Pulse 83  Temp(Src) 97.9 F (36.6 C) (Oral)  Resp 18  Ht 5\' 9"  (1.753 m)  Wt 90.719 kg (200 lb)  BMI 29.52 kg/m2  SpO2 100%  LMP 07/12/2015      Fetal tracings: Category 1--baseline 145-155, occasional very quick variable.            Contractions:   Occasional very mild UCs, frequent/irregular/mild on evening tracing--patient unaware or any UCs.      Uterus non-tender      Extremities: no significant edema and no signs of DVT          Labs:  None       Meds:  . docusate sodium  100 mg Oral BID  . hydrocortisone cream   Topical BID  . loratadine  10 mg Oral Daily  . prenatal multivitamin  1 tablet Oral Q1200    A: [redacted]w[redacted]d with PPROM at 13 weeks     AFI WNL on [place] yesterday     Stable  P: Continue current plan of care      Upcoming tests/treatments:  Continue q shift monitoring.      MDs will follow  Donnel Saxon CNM, [place] 02/12/2016 2:45 PM

## 2016-02-12 NOTE — Progress Notes (Signed)
Subjective: Strip and Chart review  Objective: BP 121/50 mmHg  Pulse 84  Temp(Src) 98.4 F (36.9 C) (Oral)  Resp 18  Ht 5\' 9"  (1.753 m)  Wt 89.767 kg (197 lb 14.4 oz)  BMI 29.21 kg/m2  SpO2 100%  LMP 07/12/2015     FHT: Category 1, 150 bpm, moderate variability, +accels, no decels  UC:  Irritability noted   Assessment:  [redacted]w[redacted]d with PPROM at 13 weeks  stable  Cat 1 FT   Contractions: irritability noted   Plan: Continue current plan of care  Upcoming tests/treatments:  NST TID T&S prn if becomes unstable  MDs will follow  [place], [place] 02/12/2016, 3:01 AM

## 2016-02-12 NOTE — Progress Notes (Signed)
Hospital day # 55 pregnancy at [redacted]w[redacted]d--PPROM at 13 weeks..  S:  Sleeping at intervals      Perception of contractions: None      Vaginal bleeding: None       Vaginal discharge:  None  O: BP 121/50 mmHg  Pulse 84  Temp(Src) 98.4 F (36.9 C) (Oral)  Resp 18  Ht 5\' 9"  (1.753 m)  Wt 89.767 kg (197 lb 14.4 oz)  BMI 29.21 kg/m2  SpO2 100%  LMP 07/12/2015      Fetal tracings: Category 1--baseline 145-155, occasional quick variables on yesterday's tracing.  Awaiting first tracing today when patient awakens.      Contractions:   None on afternoon tracing, frequent/irregular/mild on evening tracing--patient unaware.      Uterus non-tender      Extremities: no significant edema and no signs of DVT          Labs:  None       Meds:  . docusate sodium  100 mg Oral BID  . hydrocortisone cream   Topical BID  . loratadine  10 mg Oral Daily  . prenatal multivitamin  1 tablet Oral Q1200    A: [redacted]w[redacted]d with PPROM at 13 weeks     AFI WNL on [place] yesterday     Stable  P: Continue current plan of care      Upcoming tests/treatments:  Continue q shift monitoring.      MDs will follow  Donnel Saxon CNM, [place] 02/12/2016 10:53 AM

## 2016-02-13 MED ORDER — NIFEDIPINE 10 MG PO CAPS
10.0000 mg | ORAL_CAPSULE | Freq: Four times a day (QID) | ORAL | Status: DC | PRN
  Administered 2016-02-13: 10 mg via ORAL
  Filled 2016-02-13: qty 1

## 2016-02-13 NOTE — Progress Notes (Signed)
Patient ID: Erica Roth, female   DOB: 1981-01-15, 35 y.o.   MRN: [id number] Pt without complaints.  No leakage of fluid or VB.  Good FM  BP 103/67 mmHg  Pulse 71  Temp(Src) 97.5 F (36.4 C) (Oral)  Resp 16  Ht 5\' 9"  (1.753 m)  Wt 200 lb (90.719 kg)  BMI 29.52 kg/m2  SpO2 100%  LMP 07/12/2015  FHTS Baseline: 150  bpm  Toco irregular,  Pt in NAD CV RRR Lungs CTAB abd  Gravid soft and NT GU no vb EXt no calf tenderness No results found for this or any previous visit (from the past 72 hour(s)).  Assessment and Plan [redacted]w[redacted]d  PPROM AFI wnl Pt no longer c/o LOf Consulted DR Burnett Harry to ask is this a patient we can consider sending home and not deliver at 34 weeks.  She will get back to [place]

## 2016-02-13 NOTE — Progress Notes (Signed)
Hospital day # 56 pregnancy at [redacted]w[redacted]d--PPROM at 13 weeks.  S:  Perception of contractions: none      Vaginal bleeding: none now       Vaginal discharge:  no significant change      Pt wants to hold off on delivery until term or close to term.  Drs Mancel Bale and Dillard aware   O: BP 110/69 mmHg  Pulse 87  Temp(Src) 98.2 F (36.8 C) (Oral)  Resp 16  Ht 5\' 9"  (1.753 m)  Wt 200 lb (90.719 kg)  BMI 29.52 kg/m2  SpO2 100%  LMP 07/12/2015      Fetal tracings:145, moderate variability + accel occasional variable decel      Contractions:  none       Uterus gravid and non-tender      Extremities: no significant edema and no signs of DVT          Labs:  No results found for this or any previous visit (from the past 24 hour(s)).       Meds:  Current facility-administered medications:  .  acetaminophen (TYLENOL) tablet 650 mg, 650 mg, Oral, Q4H PRN, Lavetta Nielsen, CNM, 650 mg at 02/08/16 1650 .  calcium carbonate (TUMS - dosed in mg elemental calcium) chewable tablet 400 mg of elemental calcium, 2 tablet, Oral, Q4H PRN, Lavetta Nielsen, CNM, 400 mg of elemental calcium at 01/21/16 2256 .  diphenhydrAMINE (BENADRYL) capsule 25 mg, 25 mg, Oral, Q6H PRN, Farrel Gordon, CNM, 25 mg at 01/18/16 1530 .  docusate sodium (COLACE) capsule 100 mg, 100 mg, Oral, BID, Gavin Pound, CNM, 100 mg at 02/12/16 2235 .  famotidine (PEPCID) tablet 20 mg, 20 mg, Oral, BID PRN, Donnel Saxon, CNM, 20 mg at 02/09/16 2242 .  GRX ANALGESIC BALM OINT 1 application, 1 application, Apply externally, PRN, Delsa Bern, MD, 1 application at 0000000 1615 .  hydrocortisone cream 1 %, , Topical, BID, Waymon Amato, MD .  hydrOXYzine (ATARAX/VISTARIL) tablet 25 mg, 25 mg, Oral, Q6H PRN, Thurnell Lose, MD .  loratadine (CLARITIN) tablet 10 mg, 10 mg, Oral, Daily, Thurnell Lose, MD, 10 mg at 01/19/16 1004 .  polyethylene glycol (MIRALAX / GLYCOLAX) packet 17 g, 17 g, Oral, Daily PRN, Naima Dillard, MD, 17 g at 12/29/15 1153 .   prenatal multivitamin tablet 1 tablet, 1 tablet, Oral, Q1200, Lavetta Nielsen, CNM, 1 tablet at 02/12/16 1250 .  zolpidem (AMBIEN) tablet 5 mg, 5 mg, Oral, QHS PRN, Lavetta Nielsen, CNM, 5 mg at 01/10/16 2248  A: [redacted]w[redacted]d with PPROM at 13 weeks     stable     Fetal tracings: Category 2     Contractions: none     Uterus non-tender      Extremities: DTR 1+, no clonus, no edema  P: Continue current plan of care      Upcoming tests/treatments:  NST q shift      MDs will follow  Zeeshan Korte, CNM, MSN 02/13/2016. 12:46 PM

## 2016-02-13 NOTE — Progress Notes (Signed)
1440 pt contracting q1-3 min. Phoned Dillard, she advised to give procardia 10mg  po q6 prn for contractions. At this time pt is having UI, not UC as the activity is not long enough to call a contraction. Phoned Dillard, continue with current plan.

## 2016-02-14 NOTE — Progress Notes (Signed)
Pt praying

## 2016-02-14 NOTE — Progress Notes (Signed)
PROGRESS NOTE  I have reviewed the patient's vital signs, labs, and notes. I have examined the patient. I agree with the previous note from the Certified Nurse Midwife.  Gildardo Cranker, M.D. 02/14/2016

## 2016-02-14 NOTE — Progress Notes (Signed)
Hospital day # 57 pregnancy at [redacted]w[redacted]d--PPROM at 13 weeks.  S:  Perception of contractions: none      Vaginal bleeding: none now       Vaginal discharge:  no significant change  O: BP 104/55 mmHg  Pulse 75  Temp(Src) 98.3 F (36.8 C) (Oral)  Resp 16  Ht 5\' 9"  (1.753 m)  Wt 200 lb (90.719 kg)  BMI 29.52 kg/m2  SpO2 100%  LMP 07/12/2015      Fetal tracings:145, moderate variability + accel no decel      Contractions:  none       Uterus gravid and non-tender      Extremities: no significant edema and no signs of DVT          Labs:  No results found for this or any previous visit (from the past 24 hour(s)).       Meds:  Current facility-administered medications:  .  acetaminophen (TYLENOL) tablet 650 mg, 650 mg, Oral, Q4H PRN, Lavetta Nielsen, CNM, 650 mg at 02/08/16 1650 .  calcium carbonate (TUMS - dosed in mg elemental calcium) chewable tablet 400 mg of elemental calcium, 2 tablet, Oral, Q4H PRN, Lavetta Nielsen, CNM, 400 mg of elemental calcium at 01/21/16 2256 .  diphenhydrAMINE (BENADRYL) capsule 25 mg, 25 mg, Oral, Q6H PRN, Farrel Gordon, CNM, 25 mg at 01/18/16 1530 .  docusate sodium (COLACE) capsule 100 mg, 100 mg, Oral, BID, Gavin Pound, CNM, 100 mg at 02/13/16 2248 .  famotidine (PEPCID) tablet 20 mg, 20 mg, Oral, BID PRN, Donnel Saxon, CNM, 20 mg at 02/13/16 2248 .  GRX ANALGESIC BALM OINT 1 application, 1 application, Apply externally, PRN, Delsa Bern, MD, 1 application at 0000000 1615 .  hydrocortisone cream 1 %, , Topical, BID, Waymon Amato, MD .  hydrOXYzine (ATARAX/VISTARIL) tablet 25 mg, 25 mg, Oral, Q6H PRN, Thurnell Lose, MD .  loratadine (CLARITIN) tablet 10 mg, 10 mg, Oral, Daily, Thurnell Lose, MD, 10 mg at 01/19/16 1004 .  NIFEdipine (PROCARDIA) capsule 10 mg, 10 mg, Oral, Q6H PRN, Crawford Givens, MD, 10 mg at 02/13/16 1442 .  polyethylene glycol (MIRALAX / GLYCOLAX) packet 17 g, 17 g, Oral, Daily PRN, Naima Dillard, MD, 17 g at 12/29/15 1153 .  prenatal  multivitamin tablet 1 tablet, 1 tablet, Oral, Q1200, Lavetta Nielsen, CNM, 1 tablet at 02/14/16 [id number] .  zolpidem (AMBIEN) tablet 5 mg, 5 mg, Oral, QHS PRN, Lavetta Nielsen, CNM, 5 mg at 01/10/16 2248  A: [redacted]w[redacted]d with PPROM at 13 weeks     stable     Fetal tracings: Category 1     Contractions: none     Uterus non-tender      Extremities: DTR 1+, no clonus, no edema   P: Continue current plan of care      Upcoming tests/treatments:  nst q shift      MDs will follow  Dionne Knoop, CNM, MSN 02/14/2016. 3:44 PM

## 2016-02-15 NOTE — Progress Notes (Signed)
Hospital day # 58 pregnancy at [redacted]w[redacted]d--PPROM at 13 weeks.  S:  Doing well. No complaints. Has questions regarding when she is scheduled to be induced to deliver. States she would like to wait longer than 34 wks.       Perception of contractions: none      Vaginal bleeding: none       Vaginal discharge:  no significant change  O: BP 108/65 mmHg  Pulse 77  Temp(Src) 98.1 F (36.7 C) (Oral)  Resp 18  Ht 5\' 9"  (1.753 m)  Wt 90.719 kg (200 lb)  BMI 29.52 kg/m2  SpO2 100%  LMP 07/12/2015      Fetal tracings:  Cat 1, 140 bpm, moderate variability, +accels, no decels (02/15/16 1215)      Contractions:   none      Uterus gravid and non-tender      Extremities: no significant edema and no signs of DVT          Labs:   No results found for this or any previous visit (from the past 24 hour(s)).       Meds:  . docusate sodium  100 mg Oral BID  . hydrocortisone cream   Topical BID  . loratadine  10 mg Oral Daily  . prenatal multivitamin  1 tablet Oral Q1200    A: [redacted]w[redacted]d with PPROM at 13 weeks  Fetal tracings: Category 1     stable  P: Continue current plan of care      Upcoming tests/treatments:  nst q shift      MDs will follow  [place], [place] 02/15/2016 1:18 PM

## 2016-02-15 NOTE — Progress Notes (Signed)
PROGRESS NOTE  I have reviewed the patient's vital signs, labs, and notes. I have examined the patient. I agree with the previous note from the Certified Nurse Midwife.  Gildardo Cranker, M.D. 02/15/2016

## 2016-02-16 NOTE — Progress Notes (Signed)
Hospital day # 59 pregnancy at [redacted]w[redacted]d--PPROM at 13 weeks.  S: Doing well. No complaints. No bleeding.  O: BP 103/58 mmHg  Pulse 67  Temp(Src) 97.7 F (36.5 C) (Oral)  Resp 16  Ht 5\' 9"  (1.753 m)  Wt 200 lb (90.719 kg)  BMI 29.52 kg/m2  SpO2 99%  LMP 07/12/2015  Fetal tracings: Cat 1  Contractions: none  Exam: Stable    Labs:    Lab Results Last 24 Hours    No results found for this or any previous visit (from the past 24 hour(s)).     Meds:  . docusate sodium 100 mg Oral BID  . hydrocortisone cream  Topical BID  . loratadine 10 mg Oral Daily  . prenatal multivitamin 1 tablet Oral Q1200    A: [redacted]w[redacted]d P: Continue current plan of care with in hospital management.  Lesli Albee, MD 02/16/16

## 2016-02-17 NOTE — Progress Notes (Signed)
Hospital day # 60 pregnancy at [redacted]w[redacted]d--PPROM at 13 weeks.  S:  Doing well.  Has questions regarding plan of care for induction of labor.  Desires to be induced later than 34 weeks.       Perception of contractions: none      Vaginal bleeding: none        Vaginal discharge:  no significant change  O: BP 114/81 mmHg  Pulse 85  Temp(Src) 97.9 F (36.6 C) (Oral)  Resp 18  Ht 5\' 9"  (1.753 m)  Wt 90.719 kg (200 lb)  BMI 29.52 kg/m2  SpO2 99%  LMP 07/12/2015      Fetal tracings:   Cat  1, 150 bpm, moderate variability, +accels, no decels      Contractions:   Minimal uterine irritability      Uterus gravid and non-tender      Extremities: no significant edema and no signs of DVT          Labs:  No results found for this or any previous visit (from the past 24 hour(s)).       Meds:  . docusate sodium  100 mg Oral BID  . hydrocortisone cream   Topical BID  . loratadine  10 mg Oral Daily  . prenatal multivitamin  1 tablet Oral Q1200    A: [redacted]w[redacted]d with PPROM at 13 weeks     Fetal tracings: Category 1     stable  P: Continue current plan of care      Upcoming tests/treatments:  nst q shift      MDs will follow  [place], [place] 02/17/2016 8:15 AM

## 2016-02-18 NOTE — Progress Notes (Signed)
Patient sleeping soundly.   Will see when awakens.  Donnel Saxon, CNM 02/18/16 12:30p

## 2016-02-18 NOTE — Progress Notes (Addendum)
  G1P0 @ 31 weeks 4 days with PPROM   Subjective: Patient reports no complaints.  She denies recent leakage of fluid or abdominal cramping or fevers or chills. She denies vaginal bleeding. With gross fetal movement   Objective: I have reviewed patient's vital signs.  Filed Vitals:   02/17/16 2240 02/18/16 0904 02/18/16 1140 02/18/16 1141  BP:  119/88  108/66  Pulse:  90  79  Temp:  98 F (36.7 C) 97.8 F (36.6 C)   TempSrc:      Resp: 16 16 16    Height:      Weight:      SpO2:        General: alert, cooperative and no distress GI: Soft, gravid, nontender Extremities: extremities normal, atraumatic, no cyanosis or edema   NST: 02/18/16@ 10.36am: Category 1 tracing. TOCO: Low amplitude Irregular contractions and irritability.     Assessment/Plan: Stable maternal and fetal status  -Continue with inpatient management  -NST every shift  -Continue with close monitoring for signs and symptoms of infection   LOS: 61 days    [facility] 02/18/2016, 4:38 PM

## 2016-02-19 NOTE — Progress Notes (Signed)
Hosp   Meds:  . docusate sodium 100 mg Oral BID  . hydrocortisone cream  Topical BID  . loratadine 10 mg Oral Daily  . prenatal multivitamin 1 tablet Oral Q1200    A: [redacted]w[redacted]d with PPROM at 13 weeks  Fetal tracings: Category 1  stable  P: Continue current plan of care  Upcoming tests/treatments: NST q shift.  Lesli Albee, MD 02/19/16 8:44 AM

## 2016-02-21 MED ORDER — NYSTATIN-TRIAMCINOLONE 100000-0.1 UNIT/GM-% EX CREA
TOPICAL_CREAM | Freq: Two times a day (BID) | CUTANEOUS | Status: DC
  Administered 2016-02-21 (×2): via TOPICAL
  Filled 2016-02-21 (×2): qty 15

## 2016-02-21 NOTE — Progress Notes (Addendum)
Hospital day # 64 pregnancy at [redacted]w[redacted]d--PPROM at 13 weeks.  S:  Doing well--reports mild itching of external vulva      Perception of contractions: None      Vaginal bleeding: None       Vaginal discharge:  None  O: BP 115/67 mmHg  Pulse 74  Temp(Src) 97.8 F (36.6 C) (Oral)  Resp 18  Ht 5\' 9"  (1.753 m)  Wt 89.449 kg (197 lb 3.2 oz)  BMI 29.11 kg/m2  SpO2 99%  LMP 07/12/2015      Fetal tracings:  Category 1 on TID tracing      Contractions: Irritability noted, patient unaware      Uterus non-tender      Extremities: no significant edema and no signs of DVT       Vulva without obvious lesions, labia slightly irritated, no d/c          Labs:  None       Meds:  . docusate sodium  100 mg Oral BID  . hydrocortisone cream   Topical BID  . loratadine  10 mg Oral Daily  . nystatin-triamcinolone   Topical BID  . prenatal multivitamin  1 tablet Oral Q1200    A: [redacted]w[redacted]d with PPROM since 13 weeks     Stable--last AFI 11.97 on 02/11/16     Vulvar irritation  P: Continue current plan of care      Upcoming tests/treatments: Continue NSTs       MDs will follow      Mycolog 2 cream BID prn  Donnel Saxon CNM, [place] 02/21/2016 9:27 AM  Addendum: Dr. Charlesetta Garibaldi consulted with MFM today to review option for d/c home. Plan for tomorrow: Sterile speculum exam to evaluate for pooling. BPP/AFI If no clinical evidence of leaking, and BPP/AFI WNL, patient may be d/c'd home. Orders placed for [place], and Antenatal staff notified of plan.  Donnel Saxon, CNM 02/21/16 4p

## 2016-02-21 NOTE — Progress Notes (Addendum)
Subjective: Chart and Strip Review  Objective: BP 98/64 mmHg  Pulse 72  Temp(Src) 97.9 F (36.6 C) (Oral)  Resp 18  Ht 5\' 9"  (1.753 m)  Wt 89.449 kg (197 lb 3.2 oz)  BMI 29.11 kg/m2  SpO2 99%  LMP 07/12/2015       FHT: Overall Category 1, 145 bpm, moderate variability, +accels, rare variable UC:   irregular, every  30 sec to 3 minutes  No results found for this or any previous visit (from the past 24 hour(s)).  . docusate sodium  100 mg Oral BID  . hydrocortisone cream   Topical BID  . loratadine  10 mg Oral Daily  . prenatal multivitamin  1 tablet Oral Q1200    Assessment:  IUP  At 32 weeks with PPROM at 13 weeks  Fetal tracings: overall,  Category 1  stable  Plan: Continue current plan of care  Upcoming tests/treatments: nst q shift  MDs will follow    [place], [place] 02/21/2016, 12:43 AM

## 2016-02-22 ENCOUNTER — Inpatient Hospital Stay (HOSPITAL_COMMUNITY): Payer: Medicaid Other

## 2016-02-22 LAB — AMNISURE RUPTURE OF MEMBRANE (ROM) NOT AT ARMC: AMNISURE: NEGATIVE

## 2016-02-22 MED ORDER — FAMOTIDINE 20 MG PO TABS: 20.0000 mg | ORAL_TABLET | Freq: Two times a day (BID) | ORAL | Status: DC | PRN

## 2016-02-22 MED ORDER — NYSTATIN-TRIAMCINOLONE 100000-0.1 UNIT/GM-% EX CREA: TOPICAL_CREAM | Freq: Two times a day (BID) | CUTANEOUS | Status: DC

## 2016-02-22 NOTE — Progress Notes (Signed)
Came by to see patient at her request to discuss possible d/c tomorrow, pending sterile speculum exam and BPP/AFI assessment.  Reviewed Dr. Berneta Sages consult today with MFM.    Patient expressed she does not want to go home tomorrow.  She does want to defer delivery until 37 weeks, as long as she and her fetus remain without s/s of infection or labor.  She wants to remain in the hospital until 37 weeks, because she feels she will have to be more active at home, which she fears will cause her to start leaking again. She will be alone at home while her husband is at work most of the day, and she states she will be responsible for cooking and cleaning, even if we instruct her to be on bedrest.  She is afraid to be away from the support and assessment activities of the antenatal unit, including fetal monitoring.   I advised her I would communicate her concerns to Dr. Charlesetta Garibaldi tomorrow, and I encouraged her to express her concerns directly when she is seen on rounds tomorrow.  Donnel Saxon, CNM 02/21/16 7:30p

## 2016-02-22 NOTE — Progress Notes (Signed)
In US 

## 2016-02-22 NOTE — Discharge Summary (Signed)
OB Discharge Summary     Patient Name: Erica Roth DOB: 1981-01-13 MRN: [id number]  Date of admission: 12/19/2015 Delivering MD: This patient has no babies on file.  Date of discharge: 02/22/2016  Admitting diagnosis: 33WKS LEAKING FLUID Intrauterine pregnancy: [redacted]w[redacted]d     Secondary diagnosis:  Principal Problem:   Preterm premature rupture of membranes Active Problems:   Fibroids   Oligohydramnios antepartum  Additional problems: GERD     Discharge diagnosis: SEALED PPROM                                                                                                Post partum procedures:NA  Augmentation: NA  Complications: None  Hospital course:  Onset of Labor With Vaginal Delivery     35 y.o. yo G1P0 at [redacted]w[redacted]d was admitted with known PPROM that occurred at 33 weeks.  She was given BMZ ( she actually received 2 courses) and placed on BR.  She eventually stopped leaking and AFI and growth remained normal on [place]. I spoke with DR Burnett Harry who stated that if her AFI is normal with a normal speculum exam we can consider the pt to have sealed her leak. Today her exam was Wnl.  BPP 8/8 AFI is 10 and amnisure was negative.  Pt will be sent home with precautions , FKC and needs to FU in the office for weekly BPP and AFI checks.  She would only need to be induced at 39 weeks if testing remains normal Pateint had an uncomplicated postpartum course.  She is ambulating, tolerating a regular diet, passing flatus, and urinating well. Patient is discharged home in stable condition on 02/22/2016.    Physical exam  Filed Vitals:   02/21/16 2057 02/21/16 2309 02/22/16 0531 02/22/16 1057  BP: 105/63 112/66 107/66 105/63  Pulse: 76 86 64 76  Temp: 97.9 F (36.6 C) 98 F (36.7 C) 97.8 F (36.6 C) 98 F (36.7 C)  TempSrc: Oral Oral Oral Oral  Resp:  16 16 18   Height:      Weight:      SpO2: 98% 100% 100%    General: alert Lochia: SPECULUM EXAM NO POOLING, CX APPEARS CLOSED Uterine  Fundus: NT Incision: N/A DVT Evaluation: No evidence of DVT seen on physical exam. Labs: Lab Results  Component Value Date   WBC 6.5 01/17/2016   HGB 11.1* 01/17/2016   HCT 32.7* 01/17/2016   MCV 88.9 01/17/2016   PLT 225 01/17/2016   CMP Latest Ref Rng 01/15/2016  Glucose 65 - 99 mg/dL 111(H)  BUN 6 - 20 mg/dL 5(L)  Creatinine 0.44 - 1.00 mg/dL 0.46  Sodium 135 - 145 mmol/L 134(L)  Potassium 3.5 - 5.1 mmol/L 3.3(L)  Chloride 101 - 111 mmol/L 106  CO2 22 - 32 mmol/L 22  Calcium 8.9 - 10.3 mg/dL 8.6(L)  Total Protein 6.5 - 8.1 g/dL 7.3  Total Bilirubin 0.3 - 1.2 mg/dL 0.5  Alkaline Phos 38 - 126 U/L 73  AST 15 - 41 U/L 25  ALT 14 - 54 U/L 14    Discharge instruction: per After Visit  Summary and "Baby and Me Booklet".  After visit meds:    Medication List    TAKE these medications        famotidine 20 MG tablet  Commonly known as:  PEPCID  Take 1 tablet (20 mg total) by mouth 2 (two) times daily as needed for heartburn or indigestion.     nystatin-triamcinolone cream  Commonly known as:  MYCOLOG II  Apply topically 2 (two) times daily.     prenatal multivitamin Tabs tablet  Take 1 tablet by mouth daily at 12 noon.     ranitidine 150 MG tablet  Commonly known as:  ZANTAC  Take 150 mg by mouth daily.        Diet: routine diet  Activity: Advance as tolerated. Pelvic rest for 6 weeks.   Outpatient follow up:ONE Follow up Appt:No future appointments. Follow up Visit:No Follow-up on file.  Postpartum contraception: None  Newborn Data: This patient has no babies on file. Baby Feeding: NA Disposition:PT IS STILL PREGNANT   02/22/2016 Crawford Givens A, MD

## 2016-02-22 NOTE — Progress Notes (Signed)
Subjective: Chart and Strip review  Objective: BP 112/66 mmHg  Pulse 86  Temp(Src) 98 F (36.7 C) (Oral)  Resp 16  Ht 5\' 9"  (1.753 m)  Wt 89.449 kg (197 lb 3.2 oz)  BMI 29.11 kg/m2  SpO2 100%  LMP 07/12/2015     FHT: Category 1, 150 bpm, moderate variability, +accels UC:   regular, every 2-3 minutes  . docusate sodium  100 mg Oral BID  . hydrocortisone cream   Topical BID  . loratadine  10 mg Oral Daily  . nystatin-triamcinolone   Topical BID  . prenatal multivitamin  1 tablet Oral Q1200    Assessment:  [redacted]w[redacted]d with PPROM since 13 weeks Stable  Plan: Per MFM/Dr.Dillar. Windy Kalata note Potential Discharge today-   Sterile speculum exam to evaluate for pooling.  BPP/AFI  If no clinical evidence of leaking, and BPP/AFI WNL, patient may be d/c'd home.   [place], [place] 02/22/2016, 4:12 AM

## 2016-02-22 NOTE — Discharge Instructions (Signed)
Premature Rupture and Preterm Premature Rupture of Membranes Premature rupture of membranes (PROM) is when the membranes (amniotic sac) break open before contractions or labor starts. Rupture of membranes is commonly referred to as your water breaking. If PROM occurs before 37 weeks of pregnancy, it is called preterm premature rupture of membranes (PPROM). The amniotic sac holds the fetus, keeps infection out, and performs other important functions. Having the amniotic sac rupture before 37 weeks of pregnancy can lead to serious problems and requires immediate attention by your health care provider. CAUSES  PROM near the end of the pregnancy may be caused by natural weakening of the membranes. PPROM is often due to an infection. Other factors that may be associated with PROM include:  Stretching of the amniotic sac because of carrying multiples or having too much amniotic fluid.  Trauma.  Smoking during pregnancy.  Poor nutrition.  Previous preterm birth.  Vaginal bleeding.  Little to no prenatal care.  Problems with the placenta, such as placenta previa or placental abruption. RISKS OF PROM AND PPROM  Delivering a premature baby.  Getting a serious infection of the placental tissues (chorioamnionitis).  Early detachment of the placenta from the uterus (placental abruption).  Compression of the umbilical cord.  Needing a cesarean birth.  Developing a serious infection after delivery. SIGNS OF PROM OR PPROM   A sudden gush or slow leaking of fluid from the vagina.  Constant wet underwear. Sometimes, women mistake the leaking or wetness for urine, especially if the leak is slow and not a gush of fluid. If there is constant leaking or your underwear continues to get wet, your membranes have likely ruptured. WHAT TO DO IF YOU THINK YOUR MEMBRANES HAVE RUPTURED Call your health care provider right away. You will need to go to the hospital to get checked immediately. WHAT HAPPENS  IF YOU ARE DIAGNOSED WITH PROM OR PPROM? Once you arrive at the hospital, you will have tests done. A cervical exam will be performed to check if the cervix has softened or started to open (dilate). If you are diagnosed with PROM, you may be induced within 24 hours if you are not having contractions. If you are diagnosed with PPROM and are not having contractions, you may be induced depending on your trimester.  If you have PPROM, you:  And your baby will be monitored closely for signs of infection or other complications.  May be given an antibiotic medicine to lower the chances of an infection developing.  May be given a steroid medicine to help mature the baby's lungs faster.  May be given a medicine to stop preterm labor.  May be ordered to be on bed rest at home or in the hospital.  May be induced if complications arise for you or the baby. Your treatment will depend on many factors, such as how far along you are, the development of the baby, and other complications that may arise.   This information is not intended to replace advice given to you by your health care provider. Make sure you discuss any questions you have with your health care provider.   Document Released: 11/09/2005 Document Revised: 08/30/2013 Document Reviewed: 02/28/2013 Elsevier Interactive Patient Education 2016 [place]. Fetal Movement Counts Patient Name: __________________________________________________ Patient Due Date: ____________________ Performing a fetal movement count is highly recommended in high-risk pregnancies, but it is good for every pregnant woman to do. Your health care provider may ask you to start counting fetal movements at 28 weeks of  the pregnancy. Fetal movements often increase:  After eating a full meal.  After physical activity.  After eating or drinking something sweet or cold.  At rest. Pay attention to when you feel the baby is most active. This will help you notice a  pattern of your baby's sleep and wake cycles and what factors contribute to an increase in fetal movement. It is important to perform a fetal movement count at the same time each day when your baby is normally most active.  HOW TO COUNT FETAL MOVEMENTS  Find a quiet and comfortable area to sit or lie down on your left side. Lying on your left side provides the best blood and oxygen circulation to your baby.  Write down the day and time on a sheet of paper or in a journal.  Start counting kicks, flutters, swishes, rolls, or jabs in a 2-hour period. You should feel at least 10 movements within 2 hours.  If you do not feel 10 movements in 2 hours, wait 2-3 hours and count again. Look for a change in the pattern or not enough counts in 2 hours. SEEK MEDICAL CARE IF:  You feel less than 10 counts in 2 hours, tried twice.  There is no movement in over an hour.  The pattern is changing or taking longer each day to reach 10 counts in 2 hours.  You feel the baby is not moving as he or she usually does. Date: ____________ Movements: ____________ Start time: ____________ Erica Roth time: ____________  Date: ____________ Movements: ____________ Start time: ____________ Erica Roth time: ____________ Date: ____________ Movements: ____________ Start time: ____________ Erica Roth time: ____________ Date: ____________ Movements: ____________ Start time: ____________ Erica Roth time: ____________ Date: ____________ Movements: ____________ Start time: ____________ Erica Roth time: ____________ Date: ____________ Movements: ____________ Start time: ____________ Erica Roth time: ____________ Date: ____________ Movements: ____________ Start time: ____________ Erica Roth time: ____________ Date: ____________ Movements: ____________ Start time: ____________ Erica Roth time: ____________  Date: ____________ Movements: ____________ Start time: ____________ Erica Roth time: ____________ Date: ____________ Movements: ____________ Start time:  ____________ Erica Roth time: ____________ Date: ____________ Movements: ____________ Start time: ____________ Erica Roth time: ____________ Date: ____________ Movements: ____________ Start time: ____________ Erica Roth time: ____________ Date: ____________ Movements: ____________ Start time: ____________ Erica Roth time: ____________ Date: ____________ Movements: ____________ Start time: ____________ Erica Roth time: ____________ Date: ____________ Movements: ____________ Start time: ____________ Erica Roth time: ____________  Date: ____________ Movements: ____________ Start time: ____________ Erica Roth time: ____________ Date: ____________ Movements: ____________ Start time: ____________ Erica Roth time: ____________ Date: ____________ Movements: ____________ Start time: ____________ Erica Roth time: ____________ Date: ____________ Movements: ____________ Start time: ____________ Erica Roth time: ____________ Date: ____________ Movements: ____________ Start time: ____________ Erica Roth time: ____________ Date: ____________ Movements: ____________ Start time: ____________ Erica Roth time: ____________ Date: ____________ Movements: ____________ Start time: ____________ Erica Roth time: ____________  Date: ____________ Movements: ____________ Start time: ____________ Erica Roth time: ____________ Date: ____________ Movements: ____________ Start time: ____________ Erica Roth time: ____________ Date: ____________ Movements: ____________ Start time: ____________ Erica Roth time: ____________ Date: ____________ Movements: ____________ Start time: ____________ Erica Roth time: ____________ Date: ____________ Movements: ____________ Start time: ____________ Erica Roth time: ____________ Date: ____________ Movements: ____________ Start time: ____________ Erica Roth time: ____________ Date: ____________ Movements: ____________ Start time: ____________ Erica Roth time: ____________  Date: ____________ Movements: ____________ Start time: ____________ Erica Roth time: ____________ Date:  ____________ Movements: ____________ Start time: ____________ Erica Roth time: ____________ Date: ____________ Movements: ____________ Start time: ____________ Erica Roth time: ____________ Date: ____________ Movements: ____________ Start time: ____________ Erica Roth time: ____________ Date: ____________ Movements: ____________ Start time: ____________ Erica Roth time: ____________ Date: ____________ Movements: ____________  Start time: ____________ Erica Roth time: ____________ Date: ____________ Movements: ____________ Start time: ____________ Erica Roth time: ____________  Date: ____________ Movements: ____________ Start time: ____________ Erica Roth time: ____________ Date: ____________ Movements: ____________ Start time: ____________ Erica Roth time: ____________ Date: ____________ Movements: ____________ Start time: ____________ Erica Roth time: ____________ Date: ____________ Movements: ____________ Start time: ____________ Erica Roth time: ____________ Date: ____________ Movements: ____________ Start time: ____________ Erica Roth time: ____________ Date: ____________ Movements: ____________ Start time: ____________ Erica Roth time: ____________ Date: ____________ Movements: ____________ Start time: ____________ Erica Roth time: ____________  Date: ____________ Movements: ____________ Start time: ____________ Erica Roth time: ____________ Date: ____________ Movements: ____________ Start time: ____________ Erica Roth time: ____________ Date: ____________ Movements: ____________ Start time: ____________ Erica Roth time: ____________ Date: ____________ Movements: ____________ Start time: ____________ Erica Roth time: ____________ Date: ____________ Movements: ____________ Start time: ____________ Erica Roth time: ____________ Date: ____________ Movements: ____________ Start time: ____________ Erica Roth time: ____________ Date: ____________ Movements: ____________ Start time: ____________ Erica Roth time: ____________  Date: ____________ Movements: ____________ Start  time: ____________ Erica Roth time: ____________ Date: ____________ Movements: ____________ Start time: ____________ Erica Roth time: ____________ Date: ____________ Movements: ____________ Start time: ____________ Erica Roth time: ____________ Date: ____________ Movements: ____________ Start time: ____________ Erica Roth time: ____________ Date: ____________ Movements: ____________ Start time: ____________ Erica Roth time: ____________ Date: ____________ Movements: ____________ Start time: ____________ Erica Roth time: ____________   This information is not intended to replace advice given to you by your health care provider. Make sure you discuss any questions you have with your health care provider.   Document Released: 12/09/2006 Document Revised: 11/30/2014 Document Reviewed: 09/05/2012 Elsevier Interactive Patient Education [employer].

## 2016-02-26 ENCOUNTER — Inpatient Hospital Stay (HOSPITAL_COMMUNITY)
Admission: AD | Admit: 2016-02-26 | Discharge: 2016-02-26 | Discharge status: Against Medical Advice | Payer: Medicaid Other | Source: Ambulatory Visit | Attending: Obstetrics and Gynecology | Admitting: Obstetrics and Gynecology

## 2016-02-26 ENCOUNTER — Encounter (HOSPITAL_COMMUNITY): Payer: Self-pay | Admitting: *Deleted

## 2016-02-26 DIAGNOSIS — Z3A32 32 weeks gestation of pregnancy: Secondary | ICD-10-CM | POA: Insufficient documentation

## 2016-02-26 DIAGNOSIS — O42913 Preterm premature rupture of membranes, unspecified as to length of time between rupture and onset of labor, third trimester: Secondary | ICD-10-CM

## 2016-02-26 DIAGNOSIS — O26893 Other specified pregnancy related conditions, third trimester: Secondary | ICD-10-CM

## 2016-02-26 LAB — AMNISURE RUPTURE OF MEMBRANE (ROM) NOT AT ARMC: AMNISURE: POSITIVE

## 2016-02-26 NOTE — MAU Provider Note (Signed)
Erica Roth is a 35 y.o. G1 P0 at .32.5 weeks SP inpatient AP for PPROM at 13 weeks.  Today while in the office for an [place] she c/o leaking.  Amnusure on 02/22/16 was negative and with the ok from MFM she was DC to home.    Todays Amnisure was positive.  Pt given results and informed that she will be admitted back to [facility] with another round of abx.  Pt decline to stay stating she has something to do and she wanted to leave but she will come back tomorrow.  She understands she is leaving AMA and will be assuming all of the risk.     History     Patient Active Problem List   Diagnosis Date Noted  . Preterm premature rupture of membranes 12/19/2015  . Oligohydramnios antepartum 12/19/2015  . Fibroids 10/26/2015  . Preterm premature rupture of membranes (PPROM) with unknown onset of labor 10/18/2015    No chief complaint on file.  HPI  OB History    Gravida Para Term Preterm AB TAB SAB Ectopic Multiple Living   1               Past Medical History  Diagnosis Date  . Fibroid     Past Surgical History  Procedure Laterality Date  . No past surgeries      Family History  Problem Relation Age of Onset  . Hypertension Mother     Social History  Substance Use Topics  . Smoking status: Never Smoker   . Smokeless tobacco: None  . Alcohol Use: No    Allergies: No Known Allergies  Prescriptions prior to admission  Medication Sig Dispense Refill Last Dose  . docusate sodium (COLACE) 100 MG capsule Take 100 mg by mouth daily as needed for mild constipation.   02/26/2016 at Unknown time  . famotidine (PEPCID) 20 MG tablet Take 1 tablet (20 mg total) by mouth 2 (two) times daily as needed for heartburn or indigestion. 30 tablet 1 02/26/2016 at Unknown time  . nystatin-triamcinolone (MYCOLOG II) cream Apply topically 2 (two) times daily. 30 g 0 02/25/2016 at Unknown time  . Prenatal Vit-Fe Fumarate-FA (PRENATAL MULTIVITAMIN) TABS tablet Take 1 tablet by mouth daily at 12 noon.   02/26/2016 at  Unknown time    ROS See HPI above, all other systems are negative  Physical Exam   Blood pressure 120/71, pulse 75, temperature 98.1 F (36.7 C), temperature source Oral, resp. rate 20, last menstrual period 07/12/2015.  Physical Exam Ext:  WNL ABD: Soft, non tender to palpation, no rebound or guarding SVE: deferred   ED Course  Assessment: IUP at  32.5 weeks Membranes: FHR: Category 1 CTX:  none  Results for orders placed or performed during the hospital encounter of 02/26/16 (from the past 24 hour(s))  Amnisure rupture of membrane (rom)not at [facility]     Status: None   Collection Time: 02/26/16  8:39 PM  Result Value Ref Range   Amnisure ROM POSITIVE     Plan: Admit to [facility] Start abx  Pt declined and left AMA Dr Mancel Bale aware   Dashiell Franchino, CNM, MSN 02/26/2016. 9:45 PM

## 2016-02-26 NOTE — MAU Note (Signed)
Pt. And husband here from [employer]. Pt. States she was there before she came here and had an ultrasound to measure fluid then was instructed to come here for evaluation. Denies bleeding. States fluid leakage happened one time today around 1530-1600 and is clear. Pt. States baby is moving well.

## 2016-02-26 NOTE — MAU Note (Signed)
VMarland Kitchen Standard, CNM at bedside. Pt. And husband informed of plan of care and importance to readmit and place on antibiotics due to ROM. Pt. And husband refused this plan of care. AMA form signed.

## 2016-02-27 ENCOUNTER — Inpatient Hospital Stay (HOSPITAL_COMMUNITY)
Admission: AD | Admit: 2016-02-27 | Discharge: 2016-03-11 | DRG: 775 | Disposition: A | Discharge status: Home / Self Care | Payer: Medicaid Other | Source: Ambulatory Visit | Attending: Obstetrics and Gynecology | Admitting: Obstetrics and Gynecology

## 2016-02-27 ENCOUNTER — Encounter (HOSPITAL_COMMUNITY): Payer: Self-pay | Admitting: *Deleted

## 2016-02-27 DIAGNOSIS — O42113 Preterm premature rupture of membranes, onset of labor more than 24 hours following rupture, third trimester: Principal | ICD-10-CM | POA: Diagnosis present

## 2016-02-27 DIAGNOSIS — Z8249 Family history of ischemic heart disease and other diseases of the circulatory system: Secondary | ICD-10-CM

## 2016-02-27 DIAGNOSIS — O3413 Maternal care for benign tumor of corpus uteri, third trimester: Secondary | ICD-10-CM | POA: Diagnosis present

## 2016-02-27 DIAGNOSIS — D259 Leiomyoma of uterus, unspecified: Secondary | ICD-10-CM | POA: Diagnosis present

## 2016-02-27 DIAGNOSIS — Z3A32 32 weeks gestation of pregnancy: Secondary | ICD-10-CM | POA: Diagnosis not present

## 2016-02-27 DIAGNOSIS — O42013 Preterm premature rupture of membranes, onset of labor within 24 hours of rupture, third trimester: Secondary | ICD-10-CM | POA: Diagnosis present

## 2016-02-27 DIAGNOSIS — O42919 Preterm premature rupture of membranes, unspecified as to length of time between rupture and onset of labor, unspecified trimester: Secondary | ICD-10-CM

## 2016-02-27 LAB — OB RESULTS CONSOLE GBS: GBS: NEGATIVE

## 2016-02-27 LAB — GROUP B STREP BY PCR: Group B strep by PCR: NEGATIVE

## 2016-02-27 LAB — CBC
HEMATOCRIT: 32.7 % — AB (ref 36.0–46.0)
Hemoglobin: 10.9 g/dL — ABNORMAL LOW (ref 12.0–15.0)
MCH: 29.5 pg (ref 26.0–34.0)
MCHC: 33.3 g/dL (ref 30.0–36.0)
MCV: 88.4 fL (ref 78.0–100.0)
Platelets: 203 10*3/uL (ref 150–400)
RBC: 3.7 MIL/uL — ABNORMAL LOW (ref 3.87–5.11)
RDW: 13.7 % (ref 11.5–15.5)
WBC: 7.6 10*3/uL (ref 4.0–10.5)

## 2016-02-27 LAB — TYPE AND SCREEN
ABO/RH(D): O POS
ANTIBODY SCREEN: NEGATIVE

## 2016-02-27 MED ORDER — ZOLPIDEM TARTRATE 5 MG PO TABS: 5.0000 mg | ORAL_TABLET | Freq: Every evening | ORAL | Status: DC | PRN

## 2016-02-27 MED ORDER — ACETAMINOPHEN 325 MG PO TABS
650.0000 mg | ORAL_TABLET | ORAL | Status: DC | PRN
  Administered 2016-03-01 – 2016-03-02 (×2): 650 mg via ORAL
  Filled 2016-02-27 (×2): qty 2

## 2016-02-27 MED ORDER — LACTATED RINGERS IV BOLUS (SEPSIS)
500.0000 mL | Freq: Once | INTRAVENOUS | Status: AC
  Administered 2016-02-27: 500 mL via INTRAVENOUS

## 2016-02-27 MED ORDER — AZITHROMYCIN 250 MG PO TABS
1000.0000 mg | ORAL_TABLET | Freq: Once | ORAL | Status: AC
  Administered 2016-02-27: 1000 mg via ORAL
  Filled 2016-02-27: qty 4

## 2016-02-27 MED ORDER — LACTATED RINGERS IV SOLN
INTRAVENOUS | Status: DC
  Administered 2016-02-27: 21:00:00 via INTRAVENOUS

## 2016-02-27 MED ORDER — AMOXICILLIN 500 MG PO CAPS
500.0000 mg | ORAL_CAPSULE | Freq: Three times a day (TID) | ORAL | Status: AC
  Administered 2016-02-29 – 2016-03-05 (×15): 500 mg via ORAL
  Filled 2016-02-27 (×15): qty 1

## 2016-02-27 MED ORDER — FAMOTIDINE 20 MG PO TABS
40.0000 mg | ORAL_TABLET | Freq: Two times a day (BID) | ORAL | Status: DC | PRN
  Administered 2016-02-27 – 2016-03-06 (×12): 40 mg via ORAL
  Filled 2016-02-27 (×13): qty 2

## 2016-02-27 MED ORDER — DOCUSATE SODIUM 100 MG PO CAPS
100.0000 mg | ORAL_CAPSULE | Freq: Every day | ORAL | Status: DC
  Administered 2016-02-27 – 2016-03-05 (×8): 100 mg via ORAL
  Filled 2016-02-27 (×8): qty 1

## 2016-02-27 MED ORDER — CALCIUM CARBONATE ANTACID 500 MG PO CHEW
2.0000 | CHEWABLE_TABLET | ORAL | Status: DC | PRN
  Administered 2016-02-29 – 2016-03-05 (×4): 400 mg via ORAL
  Filled 2016-02-27 (×4): qty 2

## 2016-02-27 MED ORDER — SODIUM CHLORIDE 0.9 % IV SOLN
2.0000 g | Freq: Four times a day (QID) | INTRAVENOUS | Status: AC
  Administered 2016-02-27 – 2016-02-29 (×8): 2 g via INTRAVENOUS
  Filled 2016-02-27 (×8): qty 2000

## 2016-02-27 MED ORDER — PRENATAL MULTIVITAMIN CH
1.0000 | ORAL_TABLET | Freq: Every day | ORAL | Status: DC
Start: 2016-02-28 — End: 2016-03-06
  Administered 2016-02-28 – 2016-03-05 (×7): 1 via ORAL
  Filled 2016-02-27 (×7): qty 1

## 2016-02-27 NOTE — H&P (Signed)
Erica Roth is a 35 y.o. female, G1P0 at 32.6 weeks, presenting for admission for latency antibiotics followed by IOL due to LOF. Amnisure neg on 02/22/16. pPROM since 14 wks. Endorses FM. Denies fever, bleeding, foul odor to fluid, abdominal pain, ctxs or VB. Seen in MAU on 02/26/16 due to leaking - amnisure positive - refused admission.    Patient Active Problem List   Diagnosis Date Noted  . Preterm premature rupture of membranes 12/19/2015  . Oligohydramnios antepartum 12/19/2015  . Fibroids 10/26/2015   History of present pregnancy: Patient entered care at 14 weeks.  EDC of 04/18/2016 was established by Definite LMP on 07/13/2015.  Anatomy scan: 22.4weeks with oligo  Additional [place] evaluations:  -[place] on 09/18/15 at [facility] ER: [place], 9 5/7 weeks, EDC 04/18/15, with multiple fibroids noted, predominently in the anterior corpus and fundus, with largest measuring 6.5 cm. One fibroid in the anterior LUS shows an area of cystic degeneration. -[place] at MAU 10/18/15: AFV 0.66, anterior fibroid 3.59 x 2.73 x 2.23, anterior LUS 7.87 x 7.01 x 5.87 -[place] at [facility] 10/24/15: AFV 1.9, fibroid 6.64 x 6.45 x 7.28. -22.4 wks [place] MFM OB F/u 12/17/15: SIUP at 22.4wks, Midtrimester PROM, cephalic presentation, EFW 438g (25th%), somewhat limited views on fetal anatomy were noted due to oligohydramnios, no gross anomalies noted, a large anterior, lower uterine myoma is noted (5.9x6x5.2cm). Oligohydramnios noted- a single fluid pocket is noted that measures 2.28cm.   U/S by MFM at [redacted]w[redacted]d on 01/14/16: [place]. Fetal growth appropriate (50th%tile). ? Possible right club foot; left foot appears normal. Somewhat limited views of the fetal heart obtained (RVOT, ductal arch). Amniotic fluid appears normal (11.7 cm). Recs: Continue serial growth ultrasound every 4 weeks.  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 Amniotic fluid volume appears  normal.  U/S/ by MFM at [redacted]w[redacted]d on 02/11/16: Transverse lie. EFW 24th%tile. 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 Normal amniotic fluid volume. BPP 6/8 (-2 for absent breathing). Recs: Correlate BPP w/ fetal tracing. Continue inpt management - delivery at [redacted] wks gestation.  U/S by MFM at [redacted]w[redacted]d on 0000000: Cephalic presentation. Normal amniotic fluid volume. BPP 8/8. Per report, pt seems to have resealed and increased the AFV to a normal amount. Recs: Suggest as an outpt, antenatal testing and serial growth USs.  Significant prenatal events: pPROM at 14wks, Afebrile. Inpatient at [facility] from 12/19/15 - 02/22/16.   Last evaluation: Jan 19 in office by Dr. ND. FHR 146, BP 114/72, Wt 199.5lbs   OB History    Gravida Para Term Preterm AB TAB SAB Ectopic Multiple Living   1              Past Medical History  Diagnosis Date  . Fibroid    Past Surgical History  Procedure Laterality Date  . No past surgeries     Family History: family history includes Hypertension in her mother. Social History:  reports that she has never smoked. She has never used smokeless tobacco. She reports that she does not drink alcohol or use illicit drugs. 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 She will accept blood in an emergency.    Prenatal Transfer Tool  Maternal Diabetes: No Genetic Screening: Normal Maternal Ultrasounds/Referrals: Abnormal:  Findings:   Other:Oligo (resealed on 02/22/16); possible right club foot Fetal Ultrasounds or other Referrals:  Referred to [facility] F[facility] Medicine  Maternal Substance Abuse:  No Significant Maternal Medications:  Meds include: Other: PNV, Pepcid Significant Maternal Lab Results: Lab values include: Group B Strep negative  TDAP: Not given Flu: Not given  ROS:10 Systems reviewed and are negative for acute change except as noted in the HPI.   No Known Allergies     Blood pressure 115/63, pulse 67,  temperature 98.2 F (36.8 C), temperature source Oral, resp. rate 18, height 5\' 9"  (1.753 m), weight 93.2 kg (205 lb 7.5 oz), last menstrual period 07/12/2015.  Chest clear Heart RRR without murmur Abd gravid, NT, FH CWD Pelvic: Deferred Ext: WNL  FHR: BL 148 with moderate variability, +accels, quick variables UCs: q 1-3 min, non palpable  Prenatal labs: ABO, Rh: --/--/O POS (04/06 2028) Antibody: NEG (04/06 2028) Rubella: Immune RPR: Non Reactive (02/24 1241)  HBsAg: Negative (12/05 0000)  HIV: Non Reactive (02/24 1241)  GBS: Negative (02/27/16) Sickle cell/Hgb electrophoresis: Normal  Pap: Normal 10/24/2015 GC: Negative Chlamydia: Negative Genetic screenings: Normal Glucola: 1 hr elevated; 3hr GTT normal Hgb 12.4 at NOB, 11.3 at 28 weeks  Assessment: IUP at 32.6 wks Midtrimester PROM at 14 weeks on 10/18/15 Afebrile Mild ctxs/irritability Overall reassuring FHRT  GBS neg  Plan: Admit to Antepartum per consult with Dr. Cletis Media Latency antibiotics followed by IOL Continuous monitoring until ctxs subside - LR bolus now. Dr. Cletis Media updated.  Farrel Gordon, CNM 02/27/16, 11:30 PM

## 2016-02-28 NOTE — Progress Notes (Addendum)
Hospital day # 1 pregnancy at [redacted]w[redacted]d--PPROM at 14 weeks, was DC to home on 02/22/16.  On 02/27/16 she returned c/o increase lof. Pt wants to know why she still can be delivered at 39 week as previously planned.    S:  Perception of contractions: none      Vaginal bleeding: none now       Vaginal discharge:  no significant change  O: BP 107/52 mmHg  Pulse 70  Temp(Src) 97.5 F (36.4 C) (Oral)  Resp 20  Ht 5\' 9"  (1.753 m)  Wt 205 lb 7.5 oz (93.2 kg)  BMI 30.33 kg/m2  LMP 07/12/2015      Fetal tracings:135, moderate variability, + accel, no decel      Contractions:  q3-4 not felt by pt       Uterus gravid and non-tender      Extremities: no significant edema and no signs of DVT          Labs:   Results for orders placed or performed during the hospital encounter of 02/27/16 (from the past 24 hour(s))  Type and screen [place]     Status: None   Collection Time: 02/27/16  8:28 PM  Result Value Ref Range   ABO/RH(D) O POS    Antibody Screen NEG    Sample Expiration 03/01/2016   CBC     Status: Abnormal   Collection Time: 02/27/16  8:28 PM  Result Value Ref Range   WBC 7.6 4.0 - 10.5 K/uL   RBC 3.70 (L) 3.87 - 5.11 MIL/uL   Hemoglobin 10.9 (L) 12.0 - 15.0 g/dL   HCT 32.7 (L) 36.0 - 46.0 %   MCV 88.4 78.0 - 100.0 fL   MCH 29.5 26.0 - 34.0 pg   MCHC 33.3 30.0 - 36.0 g/dL   RDW 13.7 11.5 - 15.5 %   Platelets 203 150 - 400 K/uL  Group B strep by PCR     Status: None   Collection Time: 02/27/16  8:40 PM  Result Value Ref Range   Group B strep by PCR NEGATIVE NEGATIVE         Meds:  Current facility-administered medications:  .  acetaminophen (TYLENOL) tablet 650 mg, 650 mg, Oral, Q4H PRN, Farrel Gordon, CNM .  ampicillin (OMNIPEN) 2 g in sodium chloride 0.9 % 50 mL IVPB, 2 g, Intravenous, Q6H, 2 g at 02/28/16 0924 **FOLLOWED BY** [START ON 02/29/2016] amoxicillin (AMOXIL) capsule 500 mg, 500 mg, Oral, Q8H, Farrel Gordon, CNM .  calcium carbonate (TUMS -  dosed in mg elemental calcium) chewable tablet 400 mg of elemental calcium, 2 tablet, Oral, Q4H PRN, Farrel Gordon, CNM .  docusate sodium (COLACE) capsule 100 mg, 100 mg, Oral, Daily, Farrel Gordon, CNM, 100 mg at 02/27/16 2152 .  famotidine (PEPCID) tablet 40 mg, 40 mg, Oral, BID PRN, Farrel Gordon, CNM, 40 mg at 02/27/16 2152 .  lactated ringers infusion, , Intravenous, Continuous, Farrel Gordon, CNM, Last Rate: 125 mL/hr at 02/27/16 2108 .  prenatal multivitamin tablet 1 tablet, 1 tablet, Oral, Q1200, Farrel Gordon, CNM .  zolpidem (AMBIEN) tablet 5 mg, 5 mg, Oral, QHS PRN, Farrel Gordon, CNM  A: [redacted]w[redacted]d with PPROM at 14 weeks     stable     Fetal tracings: Category 1     Contractions: 3-4     Uterus non-tender      Extremities: DTR 1+, no clonus, no edema   P: Continue current plan of care  Upcoming tests/treatments:  2nd course of abx, delivery at 34 weeks      MDs will follow  Venus Standard, CNM, MSN 02/28/2016. 11:31 AM  Pt has many questions about plan.  She continues to want to be delivered at 37wks.  I will reconsult MFM so they can address her questions and check EFW and fluid.

## 2016-02-29 ENCOUNTER — Encounter (HOSPITAL_COMMUNITY): Payer: Self-pay | Admitting: Anesthesiology

## 2016-02-29 NOTE — Progress Notes (Signed)
Hospital day # 2 pregnancy at [redacted]w[redacted]d--PPROM at 14 weeks, was DC to home on 02/22/16. On 02/27/16 she returned c/o increase lof.  S:  Perception of contractions: none      Vaginal bleeding: none now       Vaginal discharge:  no significant change  O: BP 122/64 mmHg  Pulse 75  Temp(Src) 98.2 F (36.8 C) (Tympanic)  Resp 18  Ht 5\' 9"  (1.753 m)  Wt 205 lb 7.5 oz (93.2 kg)  BMI 30.33 kg/m2  LMP 07/12/2015      Fetal tracings:135, moderate variability, +accel, no decel      Contractions:  3-61min, unflet by pt       Uterus gravid and non-tender      Extremities: no significant edema and no signs of DVT          Labs:  No results found for this or any previous visit (from the past 24 hour(s)).       Meds:  Current facility-administered medications:  .  acetaminophen (TYLENOL) tablet 650 mg, 650 mg, Oral, Q4H PRN, Farrel Gordon, CNM .  ampicillin (OMNIPEN) 2 g in sodium chloride 0.9 % 50 mL IVPB, 2 g, Intravenous, Q6H, 2 g at 02/29/16 0918 **FOLLOWED BY** amoxicillin (AMOXIL) capsule 500 mg, 500 mg, Oral, Q8H, Farrel Gordon, CNM .  calcium carbonate (TUMS - dosed in mg elemental calcium) chewable tablet 400 mg of elemental calcium, 2 tablet, Oral, Q4H PRN, Farrel Gordon, CNM .  docusate sodium (COLACE) capsule 100 mg, 100 mg, Oral, Daily, Farrel Gordon, CNM, 100 mg at 02/29/16 0919 .  famotidine (PEPCID) tablet 40 mg, 40 mg, Oral, BID PRN, Farrel Gordon, CNM, 40 mg at 02/28/16 1639 .  lactated ringers infusion, , Intravenous, Continuous, Farrel Gordon, CNM, Last Rate: 125 mL/hr at 02/27/16 2108 .  prenatal multivitamin tablet 1 tablet, 1 tablet, Oral, Q1200, Farrel Gordon, CNM, 1 tablet at 02/29/16 0918 .  zolpidem (AMBIEN) tablet 5 mg, 5 mg, Oral, QHS PRN, Farrel Gordon, CNM  A: [redacted]w[redacted]d with PPROM at 14 weeks, was DC to home on 02/22/16. On 02/27/16 she returned c/o increase lof     stable     Fetal tracings: Category 1     Contractions: 3-19min, pt unaware     Uterus  non-tender      Extremities:no edema   P: Continue current plan of care      Upcoming tests/treatments:  2nd course of abx, delivery at 34 weeks      MDs will follow  Asako Saliba, CNM, MSN 02/29/2016. 9:40 AM

## 2016-03-01 MED ORDER — NIFEDIPINE 10 MG PO CAPS
10.0000 mg | ORAL_CAPSULE | Freq: Four times a day (QID) | ORAL | Status: DC
  Administered 2016-03-02 – 2016-03-06 (×18): 10 mg via ORAL
  Filled 2016-03-01 (×19): qty 1

## 2016-03-01 MED ORDER — LACTATED RINGERS IV SOLN
INTRAVENOUS | Status: DC
  Administered 2016-03-01 – 2016-03-04 (×7): via INTRAVENOUS

## 2016-03-01 MED ORDER — NIFEDIPINE 10 MG PO CAPS
20.0000 mg | ORAL_CAPSULE | Freq: Once | ORAL | Status: AC
  Administered 2016-03-01: 20 mg via ORAL
  Filled 2016-03-01: qty 2

## 2016-03-01 NOTE — Progress Notes (Addendum)
Hospital day # 3 pregnancy at [redacted]w[redacted]d--PPROM at 14 weeks, was DC to home on 02/22/16. On 02/27/16 she returned c/o increase lof..  S:  Perception of contractions: none      Vaginal bleeding: none now       Vaginal discharge:  no significant change  O: BP 122/73 mmHg  Pulse 68  Temp(Src) 98.2 F (36.8 C) (Oral)  Resp 18  Ht 5\' 9"  (1.753 m)  Wt 205 lb 7.5 oz (93.2 kg)  BMI 30.33 kg/m2  LMP 07/12/2015      Fetal tracings:140, moderate variability, +accel, no decel      Contractions:  none       Uterus gravid and non-tender      Extremities: no significant edema and no signs of DVT          Labs:  No results found for this or any previous visit (from the past 24 hour(s)).       Meds:  Current facility-administered medications:  .  acetaminophen (TYLENOL) tablet 650 mg, 650 mg, Oral, Q4H PRN, Farrel Gordon, CNM .  [COMPLETED] ampicillin (OMNIPEN) 2 g in sodium chloride 0.9 % 50 mL IVPB, 2 g, Intravenous, Q6H, 2 g at 02/29/16 1555 **FOLLOWED BY** amoxicillin (AMOXIL) capsule 500 mg, 500 mg, Oral, Q8H, Farrel Gordon, CNM, 500 mg at 03/01/16 1459 .  calcium carbonate (TUMS - dosed in mg elemental calcium) chewable tablet 400 mg of elemental calcium, 2 tablet, Oral, Q4H PRN, Farrel Gordon, CNM, 400 mg of elemental calcium at 02/29/16 2050 .  docusate sodium (COLACE) capsule 100 mg, 100 mg, Oral, Daily, Farrel Gordon, CNM, 100 mg at 03/01/16 0939 .  famotidine (PEPCID) tablet 40 mg, 40 mg, Oral, BID PRN, Farrel Gordon, CNM, 40 mg at 03/01/16 0939 .  lactated ringers infusion, , Intravenous, Continuous, Farrel Gordon, CNM, Last Rate: 125 mL/hr at 02/27/16 2108 .  prenatal multivitamin tablet 1 tablet, 1 tablet, Oral, Q1200, Farrel Gordon, CNM, 1 tablet at 03/01/16 0940 .  zolpidem (AMBIEN) tablet 5 mg, 5 mg, Oral, QHS PRN, Farrel Gordon, CNM  A: [redacted]w[redacted]d with PPROM at 14 weeks, was DC to home on 02/22/16. On 02/27/16 she returned c/o increase lof.     stable     Fetal  tracings: Category 1     Contractions: none     Uterus non-tender      Extremities: DTR 1+, no clonus, no edema  P: Continue current plan of care      Upcoming tests/treatments:  IOL at 34 weeks      MDs will follow  Venus Standard, CNM, MSN 03/01/2016. 3:11 PM  I saw and examined patient at bedside and agree with above findings,  assessment and plan.  Dr. Alesia Richards.

## 2016-03-01 NOTE — Progress Notes (Signed)
Addendum  Called to room for ctx FHR 145, moderate variability, + accel, no decel, ctx 1-2 Pt mildly aware VE FT/T/H Dr Alesia Richards informed

## 2016-03-01 NOTE — Progress Notes (Signed)
Antepartum LOS: [place], 35 y.o.,   OB History    Gravida Para Term Preterm AB TAB SAB Ectopic Multiple Living   1               Subjective -In room to assess after report received stating patient having contractions.  Patient report decreased perception of tightening.  Patient denies back pain and decreased fetal movement.   Objective  Filed Vitals:   03/01/16 1154 03/01/16 1156 03/01/16 1501 03/01/16 1502  BP:  121/76  122/73  Pulse:  81  68  Temp: 97.9 F (36.6 C)  98.2 F (36.8 C)   TempSrc:      Resp: 16  18   Height:      Weight:        No results found for this or any previous visit (from the past 24 hour(s)).  Meds: Scheduled Meds: . amoxicillin  500 mg Oral Q8H  . docusate sodium  100 mg Oral Daily  . NIFEdipine  10 mg Oral 4 times per day  . prenatal multivitamin  1 tablet Oral Q1200   Continuous Infusions: . lactated ringers 125 mL/hr at 02/27/16 2108   PRN Meds:.acetaminophen, calcium carbonate, famotidine, zolpidem   Physical Exam:   Monitoring Type:Continous Tocometry Time: FHR: Not assessed UC: Q1-74min, palpates mild  Assessment IUP at [redacted]w[redacted]d Contractions pPROM   Plan Dr. Gillermo Murdoch advises: *Give 20mg  of procardia now *Continous tocometry throughout the night *Start IV fluids continuously -Patient informed of POC and expresses concern with lack of sleep related to toco being in place.  Educated that measures are being implemented for fetal well-being, but she may refuse. -Patient agrees with POC Continue current plan of care  Maryann Conners, MSN, CNM 03/01/2016, 8:05 PM

## 2016-03-02 ENCOUNTER — Inpatient Hospital Stay (HOSPITAL_COMMUNITY): Payer: Medicaid Other

## 2016-03-02 NOTE — Progress Notes (Addendum)
[name] [id number]  Subjective: Strip and Chart Reviewed.  Objective:  Filed Vitals:   03/01/16 1501 03/01/16 1502 03/01/16 2020 03/02/16 0156  BP:  122/73 132/76 110/62  Pulse:  68 79 74  Temp: 98.2 F (36.8 C)  97.8 F (36.6 C) 97.6 F (36.4 C)  TempSrc:   Oral Oral  Resp: 18  18 18   Height:      Weight:        FHR: [id number]); 145 bpm, Mod Var, Variable Decel x 2, + 10x 10 Accel UC: None graphed initially and Toco readjusted;  Noted Qmin  Assessment: IUP at [redacted]w[redacted]d NST Reassuring for GA Contractions  Plan: -Dr. Gillermo Murdoch updated on patient status and advises *VE *Presentation [place] *Procardia 20mg  considering on VE  J. Marcy Panning, CNM 03/02/2016 2:25 AM   Addendum [id number]) -Unable to determine VE manually or visually -[place] [employer] contacted and will attempt cervical length -Dr. Gillermo Murdoch updated on findings; no new orders -Will hold procardia at current  Maryann Conners MSN, CNM 3:30 AM

## 2016-03-02 NOTE — Progress Notes (Signed)
PROGRESS NOTE  I have reviewed the patient's vital signs, labs, and notes. I have examined the patient. I agree with the previous note from the Certified Nurse Midwife.  Plan for induction of labor at [redacted] weeks gestation. The patient now agrees.  Gildardo Cranker, M.D. 03/02/2016

## 2016-03-02 NOTE — Progress Notes (Addendum)
Hospital day # 4 pregnancy at [redacted]w[redacted]d--PPROM at 14 weeks, was DC to home on 02/22/16. On 02/27/16 she returned c/o increase lof..  S:  Perception of contractions: none      Vaginal bleeding: none now       Vaginal discharge:  no significant change  O: BP 106/64 mmHg  Pulse 82  Temp(Src) 97.8 F (36.6 C) (Oral)  Resp 18  Ht 5\' 9"  (1.753 m)  Wt 205 lb 7.5 oz (93.2 kg)  BMI 30.33 kg/m2  LMP 07/12/2015      Fetal tracings:at [place], 145, moderate variability, + accel, occasional mild variable decel      Contractions:  2-31minutes       Uterus gravid and non-tender      Extremities: no significant edema and no signs of DVT          Labs:  No results found for this or any previous visit (from the past 24 hour(s)).       Meds:  Current facility-administered medications:  .  acetaminophen (TYLENOL) tablet 650 mg, 650 mg, Oral, Q4H PRN, Farrel Gordon, CNM, 650 mg at 03/02/16 0331 .  [COMPLETED] ampicillin (OMNIPEN) 2 g in sodium chloride 0.9 % 50 mL IVPB, 2 g, Intravenous, Q6H, 2 g at 02/29/16 1555 **FOLLOWED BY** amoxicillin (AMOXIL) capsule 500 mg, 500 mg, Oral, Q8H, Farrel Gordon, CNM, 500 mg at 03/02/16 0549 .  calcium carbonate (TUMS - dosed in mg elemental calcium) chewable tablet 400 mg of elemental calcium, 2 tablet, Oral, Q4H PRN, Farrel Gordon, CNM, 400 mg of elemental calcium at 03/01/16 1724 .  docusate sodium (COLACE) capsule 100 mg, 100 mg, Oral, Daily, Farrel Gordon, CNM, 100 mg at 03/02/16 1143 .  famotidine (PEPCID) tablet 40 mg, 40 mg, Oral, BID PRN, Farrel Gordon, CNM, 40 mg at 03/01/16 2130 .  lactated ringers infusion, , Intravenous, Continuous, Gavin Pound, CNM, Last Rate: 125 mL/hr at 03/02/16 0345 .  NIFEdipine (PROCARDIA) capsule 10 mg, 10 mg, Oral, 4 times per day, Mariabelen Pressly, CNM, 10 mg at 03/02/16 1143 .  prenatal multivitamin tablet 1 tablet, 1 tablet, Oral, Q1200, Farrel Gordon, CNM, 1 tablet at 03/02/16 1143 .  zolpidem (AMBIEN) tablet 5 mg, 5  mg, Oral, QHS PRN, Farrel Gordon, CNM  A: [redacted]w[redacted]d with PPROM at 14 weeks, was DC to home on 02/22/16. On 02/27/16 she returned c/o increase lof.     stable      Fetal tracings:at [place], 145, moderate variability, + accel, occasional mild variable decel Note: per the nurse the pt refused to be monitored until after lunch      Contractions:  2-64minutes      Uterus non-tender      Extremities: DTR 1+, no clonus, no edema     BMZ 1/26-27 and 3/6-7  MFM consult today: preliminary findings - FHR 155, AFI 13.56, cervical length 3cm, vertex, anterior placenta no previa.  P: Continue current plan of care      Upcoming tests/treatments:  Final [place] report and recommendation pending.      MDs will follow    Thao Vanover, CNM, MSN 03/02/2016. 12:17 PM  Addendum Final [place] unchanged from preliminary report

## 2016-03-03 NOTE — Progress Notes (Signed)
Merial Aftab [id number]  S: In room to assess.  Patient asleep.  Did not attempt to awaken  O: Toco: No ctx graphed  A: IUP at 33.4wks pPROM  P: Nurse reports patient usually awakens at 10am Will return at later time to assess.    Maryann Conners MSN, CNM 03/03/2016 9:17 AM

## 2016-03-03 NOTE — Progress Notes (Signed)
Patient ID: Erica Roth, female   DOB: 06-29-1981, 35 y.o.   MRN: [id number] Pt without complaints.  No leakage of fluid or VB.  Good FM  BP 115/73 mmHg  Pulse 76  Temp(Src) 98.5 F (36.9 C) (Oral)  Resp 16  Ht 5\' 9"  (1.753 m)  Wt 205 lb 7.5 oz (93.2 kg)  BMI 30.33 kg/m2  LMP 07/12/2015  FHTS Baseline: 150 bpm  Toco irregular, every 30-45 minutes  Pt in NAD CV RRR Lungs CTAB abd  Gravid soft and NT GU no vb EXt no calf tenderness No results found for this or any previous visit (from the past 72 hour(s)).  Assessment and Plan [redacted]w[redacted]d  PPROM Pt stable  Plan induction at 34 weeks

## 2016-03-03 NOTE — Progress Notes (Signed)
Subjective: Chart and Strip review  Objective: BP 106/60 mmHg  Pulse 78  Temp(Src) 97.9 F (36.6 C) (Oral)  Resp 18  Ht 5\' 9"  (1.753 m)  Wt 93.2 kg (205 lb 7.5 oz)  BMI 30.33 kg/m2  LMP 07/12/2015      FHT: reassuring for Gestational age , 150 bpm, moderate variability, +accels, occasional decels UC:   Irritability SVE:   Dilation:  (Unable to determine visually or manually) Exam by:: J.Emly, CNM   Assessment:  [redacted]w[redacted]d with PPROM at 14 weeks, was DC to home on 02/22/16. On 02/27/16 she returned c/o increase lof.  stable  Fetal tracings: reassuring for EGA, 150 bpm  Plan:  Continue current plan of care  MDs will follow  [place], [place] 03/03/2016, 6:17 AM

## 2016-03-04 MED ORDER — SODIUM CHLORIDE 0.9% FLUSH: 3.0000 mL | INTRAVENOUS | Status: DC | PRN

## 2016-03-04 MED ORDER — SODIUM CHLORIDE 0.9% FLUSH
3.0000 mL | Freq: Two times a day (BID) | INTRAVENOUS | Status: DC
  Administered 2016-03-04 – 2016-03-06 (×5): 3 mL via INTRAVENOUS

## 2016-03-04 NOTE — Progress Notes (Signed)
Hospital day # 6 pregnancy at [redacted]w[redacted]d--PPROM at 14 weeks, was DC to home on 02/22/16. On 02/27/16 she returned c/o increase lof.   .  S:  Perception of contractions: irritability noted, not felt by pt.      Pt without complaints. No leakage of fluid or VB. Good FM      Vaginal bleeding: none now       Vaginal discharge:  no significant change  O: BP 119/69 mmHg  Pulse 75  Temp(Src) 97.9 F (36.6 C) (Oral)  Resp 20  Ht 5\' 9"  (1.753 m)  Wt 215 lb 12.8 oz (97.886 kg)  BMI 31.85 kg/m2  LMP 07/12/2015      Fetal tracings:145, moderate variability, + accel, no decel      Contractions:  none       Uterus gravid and non-tender      Extremities: no significant edema and no signs of DVT          Labs:  No results found for this or any previous visit (from the past 24 hour(s)).       Meds:  Current facility-administered medications:  .  acetaminophen (TYLENOL) tablet 650 mg, 650 mg, Oral, Q4H PRN, Farrel Gordon, CNM, 650 mg at 03/02/16 0331 .  [COMPLETED] ampicillin (OMNIPEN) 2 g in sodium chloride 0.9 % 50 mL IVPB, 2 g, Intravenous, Q6H, 2 g at 02/29/16 1555 **FOLLOWED BY** amoxicillin (AMOXIL) capsule 500 mg, 500 mg, Oral, Q8H, Farrel Gordon, CNM, 500 mg at 03/04/16 [id number] .  calcium carbonate (TUMS - dosed in mg elemental calcium) chewable tablet 400 mg of elemental calcium, 2 tablet, Oral, Q4H PRN, Farrel Gordon, CNM, 400 mg of elemental calcium at 03/03/16 2200 .  docusate sodium (COLACE) capsule 100 mg, 100 mg, Oral, Daily, Farrel Gordon, CNM, 100 mg at 03/04/16 1001 .  famotidine (PEPCID) tablet 40 mg, 40 mg, Oral, BID PRN, Farrel Gordon, CNM, 40 mg at 03/04/16 0018 .  lactated ringers infusion, , Intravenous, Continuous, Gavin Pound, CNM, Last Rate: 125 mL/hr at 03/04/16 0830 .  NIFEdipine (PROCARDIA) capsule 10 mg, 10 mg, Oral, 4 times per day, Loron Weimer, CNM, 10 mg at 03/04/16 [id number] .  prenatal multivitamin tablet 1 tablet, 1 tablet, Oral, Q1200, Farrel Gordon, CNM, 1 tablet at 03/04/16 1000 .  sodium chloride flush (NS) 0.9 % injection 3 mL, 3 mL, Intravenous, Q12H, Ronav Furney, CNM, 3 mL at 03/04/16 1050 .  sodium chloride flush (NS) 0.9 % injection 3 mL, 3 mL, Intravenous, PRN, Saunders [place], CNM .  zolpidem (AMBIEN) tablet 5 mg, 5 mg, Oral, QHS PRN, Farrel Gordon, CNM  A: [redacted]w[redacted]d withPPROM at 14 weeks, was DC to home on 02/22/16. On 02/27/16 she returned c/o increase lof.     stable     Fetal tracings: Category 1      Contractions: none     Uterus non-tender      Extremities: DTR 1+, no clonus, no edema    PPROM  P: Continue current plan of care      Upcoming tests/treatments:  IOL at 34 weeks      MDs will follow  Caitlin Hillmer, CNM, MSN 03/04/2016. 12:57 PM

## 2016-03-05 NOTE — Progress Notes (Addendum)
Hospital day # 7 pregnancy at [redacted]w[redacted]d--PPROM at 14 weeks, was DC to home on 02/22/16. On 02/27/16 she returned c/o increase lof..  S:  Perception of contractions: none      Vaginal bleeding: none now       Vaginal discharge:  no significant change  O: BP 97/49 mmHg  Pulse 76  Temp(Src) 97.8 F (36.6 C) (Oral)  Resp 20  Ht 5\' 9"  (1.753 m)  Wt 215 lb 12.8 oz (97.886 kg)  BMI 31.85 kg/m2  LMP 07/12/2015      Fetal tracings:145, moderate variability +accel, no decel      Contractions:  None, irritibility       Uterus gravid and non-tender      Extremities: no significant edema and no signs of DVT          Labs:  No results found for this or any previous visit (from the past 24 hour(s)).       Meds:  Current facility-administered medications:  .  acetaminophen (TYLENOL) tablet 650 mg, 650 mg, Oral, Q4H PRN, Farrel Gordon, CNM, 650 mg at 03/02/16 0331 .  [COMPLETED] ampicillin (OMNIPEN) 2 g in sodium chloride 0.9 % 50 mL IVPB, 2 g, Intravenous, Q6H, 2 g at 02/29/16 1555 **FOLLOWED BY** amoxicillin (AMOXIL) capsule 500 mg, 500 mg, Oral, Q8H, Farrel Gordon, CNM, 500 mg at 03/05/16 0600 .  calcium carbonate (TUMS - dosed in mg elemental calcium) chewable tablet 400 mg of elemental calcium, 2 tablet, Oral, Q4H PRN, Farrel Gordon, CNM, 400 mg of elemental calcium at 03/03/16 2200 .  docusate sodium (COLACE) capsule 100 mg, 100 mg, Oral, Daily, Farrel Gordon, CNM, 100 mg at 03/04/16 1001 .  famotidine (PEPCID) tablet 40 mg, 40 mg, Oral, BID PRN, Farrel Gordon, CNM, 40 mg at 03/04/16 2139 .  lactated ringers infusion, , Intravenous, Continuous, Gavin Pound, CNM, Last Rate: 125 mL/hr at 03/04/16 0830 .  NIFEdipine (PROCARDIA) capsule 10 mg, 10 mg, Oral, 4 times per day, Venus Standard, CNM, 10 mg at 03/05/16 0637 .  prenatal multivitamin tablet 1 tablet, 1 tablet, Oral, Q1200, Farrel Gordon, CNM, 1 tablet at 03/04/16 1000 .  sodium chloride flush (NS) 0.9 % injection 3 mL, 3 mL,  Intravenous, Q12H, Venus Standard, CNM, 3 mL at 03/04/16 1050 .  sodium chloride flush (NS) 0.9 % injection 3 mL, 3 mL, Intravenous, PRN, Venus Standard, CNM .  zolpidem (AMBIEN) tablet 5 mg, 5 mg, Oral, QHS PRN, Farrel Gordon, CNM  A: [redacted]w[redacted]d with PPROM at 14 weeks, was DC to home on 02/22/16. On 02/27/16 she returned c/o increase lof.     stable     Fetal tracings: Category 1     Contractions: none, irritibility     Uterus non-tender      Extremities: DTR 1+, no clonus, no edema   P: Continue current plan of care      MDs will follow  Venus Standard, CNM, MSN 03/05/2016. 11:50 AM  Plan to induce at 34wks secondary to PPROM

## 2016-03-06 ENCOUNTER — Encounter (HOSPITAL_COMMUNITY): Payer: Self-pay | Admitting: *Deleted

## 2016-03-06 DIAGNOSIS — O42919 Preterm premature rupture of membranes, unspecified as to length of time between rupture and onset of labor, unspecified trimester: Secondary | ICD-10-CM | POA: Diagnosis present

## 2016-03-06 LAB — URINALYSIS, ROUTINE W REFLEX MICROSCOPIC
BILIRUBIN URINE: NEGATIVE
Glucose, UA: NEGATIVE mg/dL
HGB URINE DIPSTICK: NEGATIVE
KETONES UR: 15 mg/dL — AB
Leukocytes, UA: NEGATIVE
NITRITE: NEGATIVE
PH: 6 (ref 5.0–8.0)
Protein, ur: NEGATIVE mg/dL
Specific Gravity, Urine: 1.015 (ref 1.005–1.030)

## 2016-03-06 LAB — TYPE AND SCREEN
ABO/RH(D): O POS
Antibody Screen: NEGATIVE

## 2016-03-06 LAB — CBC
HEMATOCRIT: 34.4 % — AB (ref 36.0–46.0)
Hemoglobin: 11.7 g/dL — ABNORMAL LOW (ref 12.0–15.0)
MCH: 29.6 pg (ref 26.0–34.0)
MCHC: 34 g/dL (ref 30.0–36.0)
MCV: 87.1 fL (ref 78.0–100.0)
Platelets: 217 10*3/uL (ref 150–400)
RBC: 3.95 MIL/uL (ref 3.87–5.11)
RDW: 14 % (ref 11.5–15.5)
WBC: 7.9 10*3/uL (ref 4.0–10.5)

## 2016-03-06 MED ORDER — TERBUTALINE SULFATE 1 MG/ML IJ SOLN: 0.2500 mg | Freq: Once | INTRAMUSCULAR | Status: DC | PRN

## 2016-03-06 MED ORDER — LACTATED RINGERS IV SOLN: 1.0000 m[IU]/min | INTRAVENOUS | Status: DC

## 2016-03-06 MED ORDER — LIDOCAINE HCL (PF) 1 % IJ SOLN
30.0000 mL | INTRAMUSCULAR | Status: DC | PRN
  Filled 2016-03-06: qty 30

## 2016-03-06 MED ORDER — LACTATED RINGERS IV SOLN: 500.0000 mL | INTRAVENOUS | Status: DC | PRN

## 2016-03-06 MED ORDER — MISOPROSTOL 200 MCG PO TABS
50.0000 ug | ORAL_TABLET | ORAL | Status: DC | PRN
  Administered 2016-03-06 – 2016-03-08 (×5): 50 ug via ORAL
  Filled 2016-03-06 (×5): qty 0.5

## 2016-03-06 MED ORDER — FLEET ENEMA 7-19 GM/118ML RE ENEM: 1.0000 | ENEMA | RECTAL | Status: DC | PRN

## 2016-03-06 MED ORDER — ACETAMINOPHEN 325 MG PO TABS: 650.0000 mg | ORAL_TABLET | ORAL | Status: DC | PRN

## 2016-03-06 MED ORDER — OXYTOCIN BOLUS FROM INFUSION: 500.0000 mL | INTRAVENOUS | Status: DC

## 2016-03-06 MED ORDER — ONDANSETRON HCL 4 MG/2ML IJ SOLN
4.0000 mg | Freq: Four times a day (QID) | INTRAMUSCULAR | Status: DC | PRN
  Administered 2016-03-09: 4 mg via INTRAVENOUS
  Filled 2016-03-06: qty 2

## 2016-03-06 MED ORDER — OXYCODONE-ACETAMINOPHEN 5-325 MG PO TABS: 2.0000 | ORAL_TABLET | ORAL | Status: DC | PRN

## 2016-03-06 MED ORDER — MISOPROSTOL 25 MCG QUARTER TABLET: 25.0000 ug | ORAL_TABLET | ORAL | Status: DC | PRN

## 2016-03-06 MED ORDER — OXYCODONE-ACETAMINOPHEN 5-325 MG PO TABS: 1.0000 | ORAL_TABLET | ORAL | Status: DC | PRN

## 2016-03-06 MED ORDER — CITRIC ACID-SODIUM CITRATE 334-500 MG/5ML PO SOLN
30.0000 mL | ORAL | Status: DC | PRN
  Administered 2016-03-07 – 2016-03-08 (×2): 30 mL via ORAL
  Filled 2016-03-06 (×2): qty 15

## 2016-03-06 MED ORDER — OXYTOCIN 10 UNIT/ML IJ SOLN
2.5000 [IU]/h | INTRAVENOUS | Status: DC
  Filled 2016-03-06: qty 4

## 2016-03-06 MED ORDER — LACTATED RINGERS IV SOLN
INTRAVENOUS | Status: DC
  Administered 2016-03-07 – 2016-03-09 (×4): via INTRAVENOUS

## 2016-03-06 NOTE — Progress Notes (Signed)
Toco unplugged from [id number] secondary pt up doing am care and eating breakfast.

## 2016-03-06 NOTE — Progress Notes (Signed)
Assuming care of 35 yo Erica Roth, G1P0 @ 34.0 wks transferred from ante to [facility] for IOL due to pPROM since 14 wks.  Subjective: Offers no complaints. +FM. No leaking or bleeding. Not feeling ctxs.  Objective: BP 119/80 mmHg  Pulse 98  Temp(Src) 98.2 F (36.8 C) (Oral)  Resp 20  Ht 5\' 9"  (1.753 m)  Wt 97.886 kg (215 lb 12.8 oz)  BMI 31.85 kg/m2  LMP 07/12/2015 I/O last 3 completed shifts: In: 6 [I.V.:6] Out: -  Today's Vitals   03/06/16 1400 03/06/16 1545 03/06/16 1705 03/06/16 1857  BP: 119/74 123/75 119/80   Pulse: 80 80 98   Temp: 97.9 F (36.6 C) 98.2 F (36.8 C)    TempSrc: Oral Oral    Resp: 20 20    Height:      Weight:      PainSc:    1    Abdomen: gravid, soft, NT Last u/s on 03/02/16 -- EFW 4lbs 5oz, 38%, nl fluid AFI 9 and vtx  FHT: BL 150 w/ moderate variability, +accels, no decels Cephalic by Leopolds UC:   irregular SVE:   Dilation: Fingertip Effacement (%): 60 Station: -3 Exam by:: R Stall, CNM @ [id number]  Cytotec #1 given at 1701  Assessment:  IOL due to pPROM since 14 wks Overall reassuring FHRT for this gestational age GBS neg 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

## 2016-03-06 NOTE — Progress Notes (Signed)
Hospital day # 8 pregnancy at [redacted]w[redacted]d--PPROM at 14 weeks, was DC to home on 02/22/16. On 02/27/16 she returned c/o increase lof..  S:  Denies LOF or VB.  Baby is active.  Pt denies fever or chills. Informed by RN that urine culture ordered on 03/02/16 has not been collected yet. Pt denies urinary frequency or dysuria.  O: BP 118/67 mmHg  Pulse 79  Temp(Src) 97.8 F (36.6 C) (Oral)  Resp 20  Ht 5\' 9"  (1.753 m)  Wt 97.886 kg (215 lb 12.8 oz)  BMI 31.85 kg/m2  LMP 07/12/2015      Fetal tracings:moderate variability with accels.      Contractions:   Irritability, few contractions.      Uterus gravid and non-tender      Extremities: no significant edema and no signs of DVT          Labs:  No results found for this or any previous visit (from the past 24 hour(s)).       Meds:  Current facility-administered medications:  .  acetaminophen (TYLENOL) tablet 650 mg, 650 mg, Oral, Q4H PRN, Farrel Gordon, CNM, 650 mg at 03/02/16 0331 .  calcium carbonate (TUMS - dosed in mg elemental calcium) chewable tablet 400 mg of elemental calcium, 2 tablet, Oral, Q4H PRN, Farrel Gordon, CNM, 400 mg of elemental calcium at 03/05/16 1306 .  docusate sodium (COLACE) capsule 100 mg, 100 mg, Oral, Daily, Farrel Gordon, CNM, 100 mg at 03/05/16 1156 .  famotidine (PEPCID) tablet 40 mg, 40 mg, Oral, BID PRN, Farrel Gordon, CNM, 40 mg at 03/05/16 1210 .  lactated ringers infusion, , Intravenous, Continuous, Gavin Pound, CNM, Last Rate: 125 mL/hr at 03/04/16 0830 .  NIFEdipine (PROCARDIA) capsule 10 mg, 10 mg, Oral, 4 times per day, Venus Standard, CNM, 10 mg at 03/06/16 0600 .  prenatal multivitamin tablet 1 tablet, 1 tablet, Oral, Q1200, Farrel Gordon, CNM, 1 tablet at 03/05/16 1156 .  sodium chloride flush (NS) 0.9 % injection 3 mL, 3 mL, Intravenous, Q12H, Venus Standard, CNM, 3 mL at 03/05/16 2212 .  sodium chloride flush (NS) 0.9 % injection 3 mL, 3 mL, Intravenous, PRN, Venus Standard, CNM .   zolpidem (AMBIEN) tablet 5 mg, 5 mg, Oral, QHS PRN, Farrel Gordon, CNM  A: [redacted]w[redacted]d with PPROM at 14 weeks, was DC to home on 02/22/16. On 02/27/16 she returned c/o increase lof.     Stable.  No s/sxs of chorioamnionitis.     Fetal tracings: Category 1     Fibroids x 2 in LUS, largest 8 cm.     GBS negative.        P: IOL today after confirming presentation.  Pt is awaiting transfer.      Discontinue Procardia.      Cytotec if cervix is not ripened.      Collect urine for UA and culture.      Low threshold for antibiotics in labor due to prolonged ROM.      Reviewed IOL, possibility of fetal intolerence to labor requiring cesarean section.  Pt informed depending on location of fibroids, classical c-section may be necessary.  03/06/2016. 10:24 AM

## 2016-03-06 NOTE — Progress Notes (Addendum)
Pt requesting EFM be applied.  Pt states "baby is moving more than usual and it doesn't feel right."  Pt informed RN will apply EFM to eval fetal status.

## 2016-03-06 NOTE — Progress Notes (Signed)
Subjective: In room to greet patient and husband.  Pt states she is is very nervous about the induction process.  When asked about her plans for pain medication, she stated that she would like to avoid an epidural but may leave the option over.   Denies contraction, endorses +FM  Objective: BP 123/75 mmHg  Pulse 80  Temp(Src) 98.2 F (36.8 C) (Oral)  Resp 20  Ht 5\' 9"  (1.753 m)  Wt 97.886 kg (215 lb 12.8 oz)  BMI 31.85 kg/m2  LMP 07/12/2015 I/O last 3 completed shifts: In: 3 [I.V.:3] Out: -  Total I/O In: 3 [I.V.:3] Out: -   FHT: Category 1, 150 bpm, moderate variability, +Accels, no decels UC:   Uterine irritability  SVE:   Dilation: Fingertip Effacement (%): 60 Station: -3 Exam by:: R Ellizabeth Dacruz, CNM Membranes: PPROM since 14 wks Induction: Begin Cytotec  Pain management: none at present  Assessment:  IUP 34 wks IOL for PPROM since 14wks Oligohydramnios Fibroids  Cat 1 tracing GBS negative   Plan: Admit to [facility] for induction of labor for PPROM since 14 wks Induction process review including the following induction techniques:  Oral Cytotec, , Foley bulb, and Pitocin Administer 50 mcg cytotec PO  Pain management plans discussed -  IV pain medication, NItrious oxide or Epidural PRN May eat a regular diet while utilizing cytotec if fetal tracing remains Cat 1  Emotional support provide to Pt and husband  Dr. Simona Huh consulted/aware     [place], [place] 03/06/2016, 5:21 PM

## 2016-03-06 NOTE — Progress Notes (Signed)
Pt transferred to L&D for IOL.  Report given to L. Cresenzo, Therapist, sports.

## 2016-03-07 LAB — CULTURE, OB URINE

## 2016-03-07 MED ORDER — OXYTOCIN 10 UNIT/ML IJ SOLN
1.0000 m[IU]/min | INTRAVENOUS | Status: DC
  Administered 2016-03-07: 2 m[IU]/min via INTRAVENOUS
  Administered 2016-03-08: 12 m[IU]/min via INTRAVENOUS

## 2016-03-07 NOTE — Progress Notes (Signed)
Subjective: Pt resting. Alone in room.   Has questions about how long it will take before she has the baby.  Continues concerns regarding vaginal exams. Requests next vaginal exam be delayed until after 7pm.  Does not currently perceive contractions  Objective: BP 119/77 mmHg  Pulse 72  Temp(Src) 97.9 F (36.6 C) (Oral)  Resp 18  Ht 5\' 9"  (1.753 m)  Wt 97.886 kg (215 lb 12.8 oz)  BMI 31.85 kg/m2  LMP 07/12/2015 I/O last 3 completed shifts: In: 3 [I.V.:3] Out: -     FHT: Category 1, 135 bpm, moderate variability, +accels, no decel UC:  irregular, every 2-5 minutes SVE:   Dilation: Fingertip Effacement (%): Thick Station: -3 Exam by:: Apolonio Schneiders, CNM Membranes: PPROM at 14 wks Internals: none  Augmentation/induction: Cytotec #1 given po @ 1701 Cytotec #2 given po @ 2138 Cytotec #3 given po @ 0140 Cytotec Held at 0540 due to Contractions Pitocin started at 1114,  66mu/min for cervical ripening  Assessment:  IUP at 34.1 wks IOL due to pPROM since 14 wks - no concerns for infection Cervical Ripening Tight Introitus Overall reassuring FHRT for this GA GBS neg  Plan: Continue induction  Delay next vaginal exam until after 7pm/  IV pain medicine/Nitrous oxide or epidural PRN Report to oncoming provider V. Standard, CNM  Consult PRN  Erica Roth CNM, MSN 03/07/2016, 4:56 PM

## 2016-03-07 NOTE — Progress Notes (Signed)
Subjective: Resting & sleeping, easy to waken. Reports feeling contractions/cramping/pain in her back. Concerned about more frequent vaginal exams.  Pt desires to wear own nightgown versus hospital gown, informed that her own clotheing may get soiled.  Requested patient several times to remove jewelry, to date still has jewelry ( necklace/earings) on.    Objective: BP 120/76 mmHg  Pulse 66  Temp(Src) 97.9 F (36.6 C) (Oral)  Resp 18  Ht 5\' 9"  (1.753 m)  Wt 97.886 kg (215 lb 12.8 oz)  BMI 31.85 kg/m2  LMP 07/12/2015 I/O last 3 completed shifts: In: 3 [I.V.:3] Out: -     FHT: Category 1, 130 bpm, moderate variability, +accels, no decels UC:   regular, every 2-4 minutes , palpate mild -when patients moves on side, contractions are often not visible on EFM  SVE:   Dilation: Fingertip Effacement (%): 60 Station: -3 Exam by:: Lavetta Nielsen, CNM Vertex by [place] Membranes: PPROM at 14 wks Internals: none  Augmentation/induction: Cytotec #1 given po @ 1701 Cytotec #2 given po @ 2138 Cytotec #3 given po @ 0140 Cytotec Held at 0540 due to  Contractions Pitocin started at 1114  Assessment:  IUP at 34.1 wks IOL due to pPROM since 14 wks - no concerns for infection Cervical Ripening Overall reassuring FHRT for this GA GBS neg  Plan: Begin pitocin for cervical ripening, increase 2x2 until 79mu/min and hold for cervical ripening Continue induction  IV pain medicine/Nitrous oxide or epidural PRN Consult PRN   Lavetta Nielsen CNM, [place] 03/07/2016, 11:47 AM

## 2016-03-07 NOTE — Progress Notes (Signed)
Sleeping, yet easily aroused. Spouse at bedside.  Subjective: Reports active fetus. Denies feeling ctxs,VB or LOF.  Objective: BP 123/78 mmHg  Pulse 75  Temp(Src) 98 F (36.7 C) (Oral)  Resp 17  Ht 5\' 9"  (1.753 m)  Wt 97.886 kg (215 lb 12.8 oz)  BMI 31.85 kg/m2  LMP 07/12/2015 I/O last 3 completed shifts: In: 6 [I.V.:6] Out: -  Today's Vitals   03/07/16 0130 03/07/16 0150 03/07/16 0230 03/07/16 0330  BP:  123/78    Pulse:  75    Temp: 98 F (36.7 C)     TempSrc: Oral     Resp: 17     Height:      Weight:      PainSc: 0-No pain  0-No pain 0-No pain    FHT: BL 145 w/ moderate variability, +accels, quick, short variables UC:   irregular, every 1-3 minutes SVE:   Dilation: Fingertip Effacement (%): 60 Station: -3 Exam by:: K.Devyn Griffing @ [id number] Cvx anterior, medium consistency  Cytotec #1 given po @ 1701 Cytotec #2 given po @ 2138 Cytotec #3 given po @ 0140  Contracting too frequently for Cytotec #4  Assessment:  IOL due to pPROM since 14 wks - no concerns for infection Overall reassuring FHRT for this GA GBS neg  Plan: Continue induction  Farrel Gordon CNM 03/07/2016, 6:12 AM

## 2016-03-07 NOTE — Progress Notes (Signed)
Labor Progress  Subjective: No complaints  Objective: BP 137/87 mmHg  Pulse 78  Temp(Src) 98 F (36.7 C) (Oral)  Resp 18  Ht 5\' 9"  (1.753 m)  Wt 215 lb 12.8 oz (97.886 kg)  BMI 31.85 kg/m2  LMP 07/12/2015 I/O last 3 completed shifts: In: 3 [I.V.:3] Out: -    FHT: 140, moderate variaiblity + accel, no decel CTX:  irregular, every 3-5 minutes Uterus gravid, soft non tender SVE:  Dilation: Fingertip Effacement (%): Thick Station: -3 Exam by:: Apolonio Schneiders, CNM Pitocin at 16mUn/min  Assessment:  IUP at 34.1 weeks IOL d/t PPROM NICHD: Category 1 Membranes:  PPROM since 13 weeks, no s/s of infection  Induction:   Cytotec x3  Pitocin   Pain management: n/a GBS negative  Plan: Continue labor plan Continuousmonitoring Frequent position changes to facilitate fetal rotation and descent. Will reassess with cervical exam at [place] or earlier if necessary Continue pitocin per protocol     Erica Roth, CNM, MSN 03/07/2016. 9:38 PM

## 2016-03-07 NOTE — Progress Notes (Signed)
Subjective: Sleeping. Husband on beside couch sleeping as well.   Objective: BP 123/78 mmHg  Pulse 75  Temp(Src) 98 F (36.7 C) (Oral)  Resp 17  Ht 5\' 9"  (1.753 m)  Wt 97.886 kg (215 lb 12.8 oz)  BMI 31.85 kg/m2  LMP 07/12/2015 I/O last 3 completed shifts: In: 3 [I.V.:3] Out: -     FHT: Category 1, 140 bpm, moderate variability, +accels, no decels UC:   None noted,  Will readjust TOCO to reassess contractions pattern SVE:   Dilation: Fingertip Effacement (%): 60 Station: -3 Exam by:: K.Williams Membranes: PPROM at 14 wks Internals: none  Augmentation/induction: Cytotec #1 given po @ 1701 Cytotec #2 given po @ 2138 Cytotec #3 given po @ 0140 Cytotec Held at 0540 due to frequent contractions   Assessment:  IUP at 34.1 wks IOL due to pPROM since 14 wks - no concerns for infection Overall reassuring FHRT for this GA GBS neg  Plan: Continue induction  Recheck cervical and contractions status to determine appropriate cervical ripening method Consult PRN  Erica Roth CNM, [place] 03/07/2016, 8:40 AM

## 2016-03-08 NOTE — Progress Notes (Signed)
From [id number], patient up to bathroom and praying. [place] and toco adjusted when pt returned to bed.

## 2016-03-08 NOTE — Progress Notes (Signed)
Subjective: Resting, easy to waken.  Spouse resting on bedside couch.  Oatmeal noted on bedside table.  Pt reminder to observe a clear liquid diet during Pitocin Induction.  Pt questions length of induction process, reminded that induction takes time and informed that the length of time is unknown.   Objective: BP 132/81 mmHg  Pulse 67  Temp(Src) 98.1 F (36.7 C) (Oral)  Resp 18  Ht 5\' 9"  (1.753 m)  Wt 97.886 kg (215 lb 12.8 oz)  BMI 31.85 kg/m2  LMP 07/12/2015     FHT: Category 1, 150 bpm, moderate variability, +accels, no decels UC:   regular, every 2-5 minutes - very difficult to pick up when patient is on her side SVE:   Dilation: 1 Effacement (%): Thick Station: -3 Exam by:: V. Standard, CNM Membranes: PPROM at 14 wks Internals: none  Augmentation/induction: Cytotec:  S/p x3 doses  03/07/16 Pitocin:  started at 03/07/16 at 1114, Currently  10 mu/min for cervical ripening  Pain management: none at present   Assessment:  IUP at 34.2 weeks IOL d/t PPROM  Pitocin for Cervical ripening NICHD: Category 1 GBS neg   Plan: Continue labor plan Hold pitocin at 10 mu/min for cervical ripening Continuous monitoring Emotional support provided Encourage Rest Frequent position changes to facilitate fetal rotation and descent. Recheck cervical status at 10am, or sooner PRN Consider insertion of  IUPC  If contraction pattern remains difficult to pick up with external monitors  Consult prn  Lavetta Nielsen CNM, [place] 03/08/2016, 7:49 AM

## 2016-03-08 NOTE — Progress Notes (Signed)
Erica Roth MRN: [id number]  Subjective: -Care assumed of 35y.o. G1P0 who presents for IOL secondary to pPROM at 14wks.  In room to discuss POC as discussed with Dr. Valarie Roth.  Patient reports some mild back pain, but is otherwise comfortable.    Objective: BP 119/71 mmHg  Pulse 70  Temp(Src) 98.1 F (36.7 C) (Oral)  Resp 18  Ht 5\' 9"  (1.753 m)  Wt 97.886 kg (215 lb 12.8 oz)  BMI 31.85 kg/m2  LMP 07/12/2015      Fetal Monitoring: FHT: 135 bpm, Mod Var, -Decels, +Accels UC: Irregularly graphed  Vaginal Exam: SVE:   Dilation: 1 Effacement (%): 50 Station: -2, -3 Exam by:: a. ahrper, rnc-ob Membranes:SROM  Internal Monitors: None  Augmentation/Induction: Pitocin:None Cytotec: S/P 5 Doses  Assessment:  IUP at 34.2wks Cat I FT  pPROM IOL Bishop Score 4   Plan: -POC discussed with Dr. Valarie Roth to include patient considering foley bulb insertion in conjunction with Unasyn antibiotic.  Patient also can opt for starting low dose pitocin for cervical ripening. -Patient informed of plan and questions risk of infection with foley bulb insertion.  Patient informed that risk was increased, but exact data could not be quoted by this provider.  Patient further informed that she is at an increased risk, for infection, with IOL considering pPROM -Patient requests to discuss with family members -Nurse instructed to start low dose pitocin at 1x1 and increase to 8 gradually if patient declines foley bulb insertion -If patient opts for foley bulb insertion, provider will return to place -Dr. Valarie Roth updated on patient status -Continue other mgmt as ordered  Erica Roth, CNM 03/08/2016, 10:55 PM

## 2016-03-08 NOTE — Progress Notes (Signed)
Subjective: In room to assess pt at 1850. Sitting in bed with family at bedside. Coping well.  Reports she is starting to feel cramping.   Objective: BP 119/76 mmHg  Pulse 70  Temp(Src) 97.9 F (36.6 C) (Axillary)  Resp 18  Ht 5\' 9"  (1.753 m)  Wt 97.886 kg (215 lb 12.8 oz)  BMI 31.85 kg/m2  LMP 07/12/2015      FHT: Category 1, 140 bpm, moderate variability, +accels, no decels UC:   irregular, varying contraction patterns from irritability to every eight minutes minutes SVE:   Dilation: 1 Effacement (%): Thick Station: -3 Exam by:: Limmie Patricia RN Membranes: PPROM at 14 wks Internals: none  Augmentation/induction: Cytotec: S/p x5 doses, last dose 03/08/16 at 1844 Pitocin: started at 03/07/16 at 1114 and stopped 03/08/16 at 0948    Pain management: none at present  Assessment:  IUP at 34.2 weeks IOL d/t PPROM  Cytotec for Cervical ripening, last dose 1844 NICHD: Category 1 GBS neg   Plan: Continue  Cytotec for cervical ripening Continuous monitoring May eat regular diet if EFM remains Cat 1 Report to oncoming provider, Milinda Cave, CNM  [place], [place] 03/08/2016, 8:12 PM

## 2016-03-08 NOTE — Progress Notes (Signed)
Labor Progress  Subjective: C/o increase pain with ctx.  In off monitor up to br for 15 minutes.  Pt informed with pitocin she can not be off the monitor for that long and she should limit her BM breaks to 3-5 minutes max.  Objective: BP 120/78 mmHg  Pulse 66  Temp(Src) 98.1 F (36.7 C) (Oral)  Resp 16  Ht 5\' 9"  (1.753 m)  Wt 215 lb 12.8 oz (97.886 kg)  BMI 31.85 kg/m2  LMP 07/12/2015 I/O last 3 completed shifts: In: 3 [I.V.:3] Out: -    FHT: 140, moderate variability + accel, variable decel CTX:  irregular, every 3-5 minutes Uterus gravid, soft non tender SVE:  Dilation: 1 Effacement (%): Thick Station: -3 Exam by:: V. Michaline Kindig, CNM Pitocin at 62mUn/min  Assessment:  IUP at 34.1 weeks IOL d/t PPROM NICHD: Category 2 Membranes:  PPROM, no s/s of infection  Induction:   Cytotec x3  Pitocin -   Pain management: n/a GBS neg     Plan: Continue labor plan Continuous monitoring Rest Frequent position changes to facilitate fetal rotation and descent. Decrease pitocin to 6     Rhonda Linan, CNM, MSN 03/08/2016. 6:33 AM

## 2016-03-08 NOTE — Progress Notes (Signed)
Labor Progress  Subjective: Starting to feel the ctx  Objective: BP 106/65 mmHg  Pulse 69  Temp(Src) 98.1 F (36.7 C) (Oral)  Resp 16  Ht 5\' 9"  (1.753 m)  Wt 215 lb 12.8 oz (97.886 kg)  BMI 31.85 kg/m2  LMP 07/12/2015 I/O last 3 completed shifts: In: 3 [I.V.:3] Out: -    FHT: 135, moderate variability, +accel, no decel CTX:  irregular Uterus gravid, soft non tender SVE:  Dilation: Fingertip Effacement (%): Thick Station: -3 Exam by:: V. Baldomero Mirarchi, CNM Pitocin at 64mUn/min  Assessment:  IUP at 34.1 weeks IOL d/t PPROM NICHD: Category 1 Membranes: PPROM at 14 weeks no s/s of infection Induction:   Cytotec x3  Pitocin -  Pain management: n/a GBS negative  Plan: Continue labor plan Continuous monitoring Frequent position changes to facilitate fetal rotation and descent. Will reassess with cervical exam at 0700 or earlier if necessary Continue pitocin per protocol Dr Simona Huh aware     Panagiotis Oelkers, CNM, MSN 03/08/2016. 3:59 AM

## 2016-03-08 NOTE — Progress Notes (Signed)
Labor Progress  Subjective: Pt off monitor up to br, came back to pray.  Pt asked if this was the last cervical eval for the night.  I explained the the POC in L&D is very different from the POC on AP.  She need to be evaluated every hour by the nurse and every few hours by a provider.  Objective: BP 128/85 mmHg  Pulse 69  Temp(Src) 98 F (36.7 C) (Oral)  Resp 18  Ht 5\' 9"  (1.753 m)  Wt 215 lb 12.8 oz (97.886 kg)  BMI 31.85 kg/m2  LMP 07/12/2015 I/O last 3 completed shifts: In: 3 [I.V.:3] Out: -    FHT: 135, moderate variaiblity + accel, no decel CTX:  regular, every 3-5 minutes Uterus gravid, soft non tender SVE:  Dilation: Fingertip Effacement (%): Thick Station: -3 Exam by:: Apolonio Schneiders, CNM Pitocin at 10 mUn/min  Assessment:  IUP at 34.1 weeks IOL d/t PPROM at 64 weeks NICHD: Category 1 Membranes:  PPROM, no s/s of infection  Induction:   Cytotec x3  Pitocin   Pain management: n/a  GBS neg  Plan: Continue labor plan Continuous monitoring Rest Frequent position changes to facilitate fetal rotation and descent. Will reassess with cervical exam at 0400 or earlier if necessary Hold pitocin per protocol 50mu      Avangelina Flight, CNM, MSN 03/08/2016. 12:02 AM

## 2016-03-08 NOTE — Progress Notes (Signed)
Subjective: Pt awake, discussed plan of care with patient.   Objective: BP 123/81 mmHg  Pulse 79  Temp(Src) 98.4 F (36.9 C) (Oral)  Resp 18  Ht 5\' 9"  (1.753 m)  Wt 97.886 kg (215 lb 12.8 oz)  BMI 31.85 kg/m2  LMP 07/12/2015      FHT: Category 1, 130, moderate variability, +accels, no decels UC:   irritability SVE:   Dilation: 1 Effacement (%): Thick Station: -3 Exam by:: V. Standard, CNM Membranes: PPROM at 14 wks Internals: none  Augmentation/induction: Cytotec: S/p x3 doses 03/07/16 Pitocin: started at 03/07/16 at 1114, Currently 10 mu/min for cervical ripening  Pain management: none at present   Assessment:  IUP at 34.2 weeks IOL d/t PPROM  Pitocin for Cervical ripening NICHD: Category 1 GBS neg   Plan: Dr. Simona Huh consulted Stop pitocin to provide wash out period Regular diet during washout period Pt able to shower Intermittent monitoring during wash out period After 1-2 hrs break, pt to be rechecked Based on contractions pattern and cervical dilation mode of induction continuation ( Cytotecor Pitocin) to be decided Discussed plan of care with patient and spouse, both agree with plan.   [place], [place] 03/08/2016, 11:00 AM

## 2016-03-09 ENCOUNTER — Inpatient Hospital Stay (HOSPITAL_COMMUNITY): Payer: Medicaid Other | Admitting: Anesthesiology

## 2016-03-09 ENCOUNTER — Encounter (HOSPITAL_COMMUNITY): Payer: Self-pay | Admitting: Anesthesiology

## 2016-03-09 LAB — TYPE AND SCREEN
ABO/RH(D): O POS
ANTIBODY SCREEN: NEGATIVE

## 2016-03-09 LAB — CBC
HCT: 34.1 % — ABNORMAL LOW (ref 36.0–46.0)
HEMOGLOBIN: 11.8 g/dL — AB (ref 12.0–15.0)
MCH: 29.9 pg (ref 26.0–34.0)
MCHC: 34.6 g/dL (ref 30.0–36.0)
MCV: 86.3 fL (ref 78.0–100.0)
Platelets: 205 10*3/uL (ref 150–400)
RBC: 3.95 MIL/uL (ref 3.87–5.11)
RDW: 13.9 % (ref 11.5–15.5)
WBC: 11.2 10*3/uL — AB (ref 4.0–10.5)

## 2016-03-09 MED ORDER — ONDANSETRON HCL 4 MG/2ML IJ SOLN: 4.0000 mg | INTRAMUSCULAR | Status: DC | PRN

## 2016-03-09 MED ORDER — PHENYLEPHRINE 40 MCG/ML (10ML) SYRINGE FOR IV PUSH (FOR BLOOD PRESSURE SUPPORT)
80.0000 ug | PREFILLED_SYRINGE | INTRAVENOUS | Status: DC | PRN
  Filled 2016-03-09: qty 2
  Filled 2016-03-09: qty 20

## 2016-03-09 MED ORDER — DIPHENHYDRAMINE HCL 25 MG PO CAPS: 25.0000 mg | ORAL_CAPSULE | Freq: Four times a day (QID) | ORAL | Status: DC | PRN

## 2016-03-09 MED ORDER — ZOLPIDEM TARTRATE 5 MG PO TABS: 5.0000 mg | ORAL_TABLET | Freq: Every evening | ORAL | Status: DC | PRN

## 2016-03-09 MED ORDER — OXYTOCIN 10 UNIT/ML IJ SOLN: 1.0000 m[IU]/min | INTRAVENOUS | Status: DC

## 2016-03-09 MED ORDER — DIBUCAINE 1 % RE OINT: 1.0000 "application " | TOPICAL_OINTMENT | RECTAL | Status: DC | PRN

## 2016-03-09 MED ORDER — ONDANSETRON HCL 4 MG PO TABS: 4.0000 mg | ORAL_TABLET | ORAL | Status: DC | PRN

## 2016-03-09 MED ORDER — LACTATED RINGERS IV SOLN
1.0000 m[IU]/min | INTRAVENOUS | Status: DC
  Administered 2016-03-09: 666 m[IU]/min via INTRAVENOUS

## 2016-03-09 MED ORDER — ACETAMINOPHEN 325 MG PO TABS: 650.0000 mg | ORAL_TABLET | ORAL | Status: DC | PRN

## 2016-03-09 MED ORDER — BENZOCAINE-MENTHOL 20-0.5 % EX AERO
1.0000 "application " | INHALATION_SPRAY | CUTANEOUS | Status: DC | PRN
  Filled 2016-03-09: qty 56

## 2016-03-09 MED ORDER — IBUPROFEN 600 MG PO TABS
600.0000 mg | ORAL_TABLET | Freq: Four times a day (QID) | ORAL | Status: DC
  Administered 2016-03-09 – 2016-03-11 (×7): 600 mg via ORAL
  Filled 2016-03-09 (×7): qty 1

## 2016-03-09 MED ORDER — SENNOSIDES-DOCUSATE SODIUM 8.6-50 MG PO TABS
2.0000 | ORAL_TABLET | ORAL | Status: DC
  Administered 2016-03-09 – 2016-03-10 (×2): 2 via ORAL
  Filled 2016-03-09 (×2): qty 2

## 2016-03-09 MED ORDER — FENTANYL CITRATE (PF) 100 MCG/2ML IJ SOLN
50.0000 ug | INTRAMUSCULAR | Status: DC | PRN
  Administered 2016-03-09: 50 ug via INTRAVENOUS
  Filled 2016-03-09: qty 2

## 2016-03-09 MED ORDER — PHENYLEPHRINE 40 MCG/ML (10ML) SYRINGE FOR IV PUSH (FOR BLOOD PRESSURE SUPPORT)
80.0000 ug | PREFILLED_SYRINGE | INTRAVENOUS | Status: DC | PRN
  Filled 2016-03-09: qty 2

## 2016-03-09 MED ORDER — PRENATAL MULTIVITAMIN CH
1.0000 | ORAL_TABLET | Freq: Every day | ORAL | Status: DC
  Administered 2016-03-10 – 2016-03-11 (×2): 1 via ORAL
  Filled 2016-03-09 (×2): qty 1

## 2016-03-09 MED ORDER — TETANUS-DIPHTH-ACELL PERTUSSIS 5-2.5-18.5 LF-MCG/0.5 IM SUSP
0.5000 mL | Freq: Once | INTRAMUSCULAR | Status: AC
  Administered 2016-03-10: 0.5 mL via INTRAMUSCULAR
  Filled 2016-03-09: qty 0.5

## 2016-03-09 MED ORDER — EPHEDRINE 5 MG/ML INJ
10.0000 mg | INTRAVENOUS | Status: DC | PRN
  Filled 2016-03-09: qty 2

## 2016-03-09 MED ORDER — DIPHENHYDRAMINE HCL 50 MG/ML IJ SOLN: 12.5000 mg | INTRAMUSCULAR | Status: DC | PRN

## 2016-03-09 MED ORDER — LACTATED RINGERS IV SOLN: 500.0000 mL | Freq: Once | INTRAVENOUS | Status: DC

## 2016-03-09 MED ORDER — COCONUT OIL OIL: 1.0000 "application " | TOPICAL_OIL | Status: DC | PRN

## 2016-03-09 MED ORDER — WITCH HAZEL-GLYCERIN EX PADS: 1.0000 "application " | MEDICATED_PAD | CUTANEOUS | Status: DC | PRN

## 2016-03-09 MED ORDER — EPHEDRINE 5 MG/ML INJ
10.0000 mg | INTRAVENOUS | Status: DC | PRN
Start: 2016-03-09 — End: 2016-03-09
  Filled 2016-03-09: qty 2

## 2016-03-09 MED ORDER — LIDOCAINE HCL (PF) 1 % IJ SOLN
INTRAMUSCULAR | Status: DC | PRN
  Administered 2016-03-09: 4 mL via EPIDURAL
  Administered 2016-03-09: 8 mL via EPIDURAL
  Administered 2016-03-09: 4 mL via EPIDURAL

## 2016-03-09 MED ORDER — SIMETHICONE 80 MG PO CHEW: 80.0000 mg | CHEWABLE_TABLET | ORAL | Status: DC | PRN

## 2016-03-09 MED ORDER — FENTANYL 2.5 MCG/ML BUPIVACAINE 1/10 % EPIDURAL INFUSION (WH - ANES)
14.0000 mL/h | INTRAMUSCULAR | Status: DC | PRN
  Administered 2016-03-09: 14 mL/h via EPIDURAL
  Filled 2016-03-09: qty 125

## 2016-03-09 NOTE — Progress Notes (Signed)
Assumed care of patient at 0930.

## 2016-03-09 NOTE — Progress Notes (Signed)
Labor Progress  Subjective: C/o ctx pain 7/10,   Objective: BP 128/71 mmHg  Pulse 76  Temp(Src) 98.7 F (37.1 C) (Oral)  Resp 18  Ht 5\' 9"  (1.753 m)  Wt 215 lb 12.8 oz (97.886 kg)  BMI 31.85 kg/m2  LMP 07/12/2015     FHT:150, moderate variability, + accel, no decel CTX:  regular, every 2-3 minutes Uterus gravid, soft non tender SVE:  Dilation: 2.5 Effacement (%): 60 Station: -3 Exam by:: Mustaf Antonacci, cnm Pitocin at 28mUn/min  Assessment:  IUP at 34.3 weeks IOL d/t PPROM NICHD: Category 1 Membranes:  PPROM at 13 weeks  Induction:   Cytotec x5  Pitocin  Pain management: Nitrous:  GBS negtaive IUPC placed   Plan: Continue labor plan Continuous monitoring Rest Frequent position changes to facilitate fetal rotation and descent. Will reassess with cervical exam at 1400 or earlier if necessary Continue pitocin per protocol      Jemaine Prokop, CNM, MSN 03/09/2016. 10:23 AM

## 2016-03-09 NOTE — Anesthesia Procedure Notes (Signed)
Epidural Patient location during procedure: OB Start time: 03/09/2016 11:35 AM End time: 03/09/2016 11:47 AM  Staffing Anesthesiologist: Lyn Hollingshead Performed by: anesthesiologist   Preanesthetic Checklist Completed: patient identified, surgical consent, pre-op evaluation, timeout performed, IV checked, risks and benefits discussed and monitors and equipment checked  Epidural Patient position: sitting Prep: site prepped and draped and DuraPrep Patient monitoring: continuous pulse ox and blood pressure Approach: midline Location: L3-L4 Injection technique: LOR air  Needle:  Needle type: Tuohy  Needle gauge: 17 G Needle length: 9 cm and 9 Needle insertion depth: 7 cm Catheter type: closed end flexible Catheter size: 19 Gauge Catheter at skin depth: 12 cm Test dose: negative and Other  Assessment Sensory level: T10 Events: blood not aspirated, injection not painful, no injection resistance, negative IV test and no paresthesia  Additional Notes Reason for block:procedure for pain

## 2016-03-09 NOTE — Anesthesia Preprocedure Evaluation (Signed)
Anesthesia Evaluation  Patient identified by MRN, date of birth, ID band Patient awake    Reviewed: Allergy & Precautions, H&P , NPO status , Patient's Chart, lab work & pertinent test results  Airway Mallampati: II  TM Distance: >3 FB Neck ROM: full    Dental no notable dental hx.    Pulmonary neg pulmonary ROS,    Pulmonary exam normal       Cardiovascular negative cardio ROS Normal cardiovascular exam    Neuro/Psych negative neurological ROS  negative psych ROS   GI/Hepatic negative GI ROS, Neg liver ROS,   Endo/Other  negative endocrine ROS  Renal/GU negative Renal ROS     Musculoskeletal   Abdominal Normal abdominal exam  (+)   Peds  Hematology negative hematology ROS (+)   Anesthesia Other Findings   Reproductive/Obstetrics (+) Pregnancy                             Anesthesia Physical Anesthesia Plan  ASA: II  Anesthesia Plan: Epidural   Post-op Pain Management:    Induction:   Airway Management Planned:   Additional Equipment:   Intra-op Plan:   Post-operative Plan:   Informed Consent: I have reviewed the patients History and Physical, chart, labs and discussed the procedure including the risks, benefits and alternatives for the proposed anesthesia with the patient or authorized representative who has indicated his/her understanding and acceptance.     Plan Discussed with:   Anesthesia Plan Comments:         Anesthesia Quick Evaluation  

## 2016-03-10 ENCOUNTER — Encounter (HOSPITAL_COMMUNITY): Payer: Self-pay | Admitting: *Deleted

## 2016-03-10 LAB — CBC
HEMATOCRIT: 29.3 % — AB (ref 36.0–46.0)
Hemoglobin: 10.3 g/dL — ABNORMAL LOW (ref 12.0–15.0)
MCH: 30.5 pg (ref 26.0–34.0)
MCHC: 35.2 g/dL (ref 30.0–36.0)
MCV: 86.7 fL (ref 78.0–100.0)
PLATELETS: 173 10*3/uL (ref 150–400)
RBC: 3.38 MIL/uL — AB (ref 3.87–5.11)
RDW: 14.1 % (ref 11.5–15.5)
WBC: 10.6 10*3/uL — AB (ref 4.0–10.5)

## 2016-03-10 NOTE — Progress Notes (Signed)
Erica Roth   Subjective: Post Partum Day 1 Vaginal delivery, 2nd degree laceration Patient up ad lib, denies syncope or dizziness. Reports consuming regular diet without issues and denies N/V No issues with urination and reports bleeding is appropriate  Feeding:  Breast pumping Contraceptive plan:   none  Objective: Temp:  [97.5 F (36.4 C)-98.7 F (37.1 C)] 98.1 F (36.7 C) (04/18 0458) Pulse Rate:  [67-134] 69 (04/18 0458) Resp:  [15-18] 15 (04/18 0458) BP: (101-149)/(53-103) 109/54 mmHg (04/18 0458) SpO2:  [100 %] 100 % (04/18 0458)  Physical Exam:  General: alert and cooperative Ext: WNL, no significant  edema. No evidence of DVT seen on physical exam. Breast: Soft filling Lungs: CTAB Heart RRR without murmur  Abdomen:  Soft, fundus firm, lochia scant, + bowel sounds, non distended, non tender Lochia: appropriate Uterine Fundus: firm Laceration: healing well    Recent Labs  03/09/16 0307 03/10/16 0547  HGB 11.8* 10.3*  HCT 34.1* 29.3*    Assessment S/P Vaginal Delivery-Day 1 Stable  Normal [employer] Breastfeeding/pmping   Plan: Continue current care Plan for discharge tomorrow and Lactation consult Lactation support   Lestat Golob, CNM, MSN 03/10/2016, 12:04 PM

## 2016-03-10 NOTE — Lactation Note (Signed)
This note was copied from a baby's chart. Lactation Consultation Note  Patient Name: [name] [id number] Date: 03/10/2016 Reason for consult: Initial assessment;NICU baby  NICU baby 16 hours old. Offered mom interpretive services, but she declined. Mom reports that she pumped last night, but has not pumped today--over 14 hours. Discussed with mom the importance of pumping every 2-3 hours for a at least 8 times/24 hours. Enc pumping for 15 minutes and then hand expressing. Assisted mom with hand express, but no colostrum present. Discussed the normal progression of milk coming to volume, and supply and demand. Reviewed NICU booklet and [employer] brochure. Mom gave permission to send BF referral form, and it was faxed to [facility] office. Enc mom to offer STS and nuzzling/latching at breast as she and baby able.  Maternal Data Has patient been taught Hand Expression?: Yes Does the patient have breastfeeding experience prior to this delivery?: No  Feeding Feeding Type: Formula Length of feed: 30 min  LATCH Score/Interventions                      Lactation Tools Discussed/Used WIC Program:  (Medicaid pending.) Pump Review: Setup, frequency, and cleaning;Milk Storage Initiated by:: bedside RN. Date initiated:: 03/09/16   Consult Status Consult Status: Follow-up Date: 03/11/16 Follow-up type: In-patient    Inocente Salles 03/10/2016, 2:50 PM

## 2016-03-10 NOTE — Anesthesia Postprocedure Evaluation (Signed)
Anesthesia Post Note  Patient: Erica Roth  Procedure(s) Performed: * No procedures listed *  Patient location during evaluation: [facility] Anesthesia Type: Epidural Level of consciousness: awake and alert and oriented Pain management: pain level controlled Vital Signs Assessment: post-procedure vital signs reviewed and stable Respiratory status: spontaneous breathing, nonlabored ventilation and respiratory function stable Cardiovascular status: stable Postop Assessment: no headache, no backache, epidural receding, patient able to bend at knees, no signs of nausea or vomiting and adequate PO intake Anesthetic complications: no    Last Vitals:  Filed Vitals:   03/09/16 2121 03/10/16 0458  BP: 120/69 109/54  Pulse: 67 69  Temp: 37.1 C 36.7 C  Resp: 16 15    Last Pain:  Filed Vitals:   03/10/16 0655  PainSc: Asleep                 Rayvon Char

## 2016-03-10 NOTE — Discharge Summary (Signed)
Erica Roth Discharge Summary   Patient Name:   Erica Roth DOB:     Sep 27, 1981 MRN:     [id number]  Date of Admission:   02/27/2016 Date of Discharge:  03/11/16  Admitting diagnosis:    32WKS ADMISSION Principal Problem:   Preterm premature rupture of membranes Active Problems:   Preterm premature rupture of membranes (PPROM) with unknown onset of labor   Normal vaginal delivery  Preterm Pregnancy Delivered and PPROM at 14 weeks    Discharge diagnosis:    32WKS ADMISSION Principal Problem:   Preterm premature rupture of membranes Active Problems:   Preterm premature rupture of membranes (PPROM) with unknown onset of labor   Normal vaginal delivery  Preterm Pregnancy Delivered and PPROM at 14 weeks                                                                     Post partum procedures: none  Type of Delivery:  SVD  Delivering Provider: Sallee Provencal   Date of Delivery:  03/09/16  Newborn Data:    Live born female  Birth Weight: 4 lb 0.9 oz (1840 g) APGAR: ,   Baby's Name:  Erica Roth Baby Feeding:   Breast pumping Disposition:   NICU  Complications:   None  Hospital course:      Induction of Labor With Vaginal Delivery   35 y.o. yo G1P0101 at [redacted]w[redacted]d was admitted to the hospital 02/27/2016 for induction of labor.  Indication for induction: PPROM at 14 weeks.  Patient had an uncomplicated labor course as follows: Membrane Rupture Time/Date: 8:00 AM ,10/18/2015   Intrapartum Procedures: Episiotomy: None [1]                                         Lacerations:  2nd degree [3];Perineal [11];Vaginal [6]  Patient had delivery of a Viable infant.  Information for the patient's newborn:  Erica, Roth [id number]  Delivery Method: Vaginal, Spontaneous Delivery (Filed from Delivery Summary)   03/09/2016  Details of delivery can be found in separate delivery note.  Patient had a routine postpartum course. Patient is discharged home  03/10/2016.   Physical Exam:   Filed Vitals:   03/09/16 1745 03/09/16 1848 03/09/16 2121 03/10/16 0458  BP: 117/71 122/68 120/69 109/54  Pulse: 74 77 67 69  Temp: 98.7 F (37.1 C) 98.3 F (36.8 C) 98.7 F (37.1 C) 98.1 F (36.7 C)  TempSrc: Oral Oral Oral Oral  Resp: 18 18 16 15   Height:      Weight:      SpO2: 100% 100% 100% 100%   General: alert, cooperative and no distress Lochia: appropriate Uterine Fundus: firm Incision: Healing well with no significant drainage DVT Evaluation: No evidence of DVT seen on physical exam.  Labs: Lab Results  Component Value Date   WBC 10.6* 03/10/2016   HGB 10.3* 03/10/2016   HCT 29.3* 03/10/2016   MCV 86.7 03/10/2016   PLT 173 03/10/2016   CMP Latest Ref Rng 01/15/2016  Glucose 65 - 99 mg/dL 111(H)  BUN 6 - 20 mg/dL 5(L)  Creatinine 0.44 - 1.00 mg/dL 0.46  Sodium  135 - 145 mmol/L 134(L)  Potassium 3.5 - 5.1 mmol/L 3.3(L)  Chloride 101 - 111 mmol/L 106  CO2 22 - 32 mmol/L 22  Calcium 8.9 - 10.3 mg/dL 8.6(L)  Total Protein 6.5 - 8.1 g/dL 7.3  Total Bilirubin 0.3 - 1.2 mg/dL 0.5  Alkaline Phos 38 - 126 U/L 73  AST 15 - 41 U/L 25  ALT 14 - 54 U/L 14    Discharge instruction: per After Visit Summary and "Baby and Me Booklet".  After Visit Meds:    Medication List    ASK your doctor about these medications        docusate sodium 100 MG capsule  Commonly known as:  COLACE  Take 100 mg by mouth daily as needed for mild constipation.     famotidine 20 MG tablet  Commonly known as:  PEPCID  Take 1 tablet (20 mg total) by mouth 2 (two) times daily as needed for heartburn or indigestion.     nystatin-triamcinolone cream  Commonly known as:  MYCOLOG II  Apply topically 2 (two) times daily.     prenatal multivitamin Tabs tablet  Take 1 tablet by mouth daily at 12 noon.        Diet: routine diet  Activity: Advance as tolerated. Pelvic rest for 6 weeks.   Outpatient follow up:6 weeks Follow up Appt:No future  appointments. Follow up visit: No Follow-up on file.  Postpartum contraception: None  03/11/16 Leala Bryand, CNM Postpartum Care After Vaginal Delivery  After you deliver your newborn (postpartum period), the usual stay in the hospital is 24 72 hours. If there were problems with your labor or delivery, or if you have other medical problems, you might be in the hospital longer.  While you are in the hospital, you will receive help and instructions on how to care for yourself and your newborn during the postpartum period.  While you are in the hospital:  Be sure to tell your nurses if you have pain or discomfort, as well as where you feel the pain and what makes the pain worse.  If you had an incision made near your vagina (episiotomy) or if you had some tearing during delivery, the nurses may put ice packs on your episiotomy or tear. The ice packs may help to reduce the pain and swelling.  If you are breastfeeding, you may feel uncomfortable contractions of your uterus for a couple of weeks. This is normal. The contractions help your uterus get back to normal size.  It is normal to have some bleeding after delivery.  For the first 1 3 days after delivery, the flow is red and the amount may be similar to a period.  It is common for the flow to start and stop.  In the first few days, you may pass some small clots. Let your nurses know if you begin to pass large clots or your flow increases.  Do not  flush blood clots down the toilet before having the nurse look at them.  During the next 3 10 days after delivery, your flow should become more watery and pink or brown-tinged in color.  Ten to fourteen days after delivery, your flow should be a small amount of yellowish-white discharge.  The amount of your flow will decrease over the first few weeks after delivery. Your flow may stop in 6 8 weeks. Most women have had their flow stop by 12 weeks after delivery.  You should change your  sanitary pads frequently.  Wash your hands thoroughly with soap and water for at least 20 seconds after changing pads, using the toilet, or before holding or feeding your newborn.  You should feel like you need to empty your bladder within the first 6 8 hours after delivery.  In case you become weak, lightheaded, or faint, call your nurse before you get out of bed for the first time and before you take a shower for the first time.  Within the first few days after delivery, your breasts may begin to feel tender and full. This is called engorgement. Breast tenderness usually goes away within 48 72 hours after engorgement occurs. You may also notice milk leaking from your breasts. If you are not breastfeeding, do not stimulate your breasts. Breast stimulation can make your breasts produce more milk.  Spending as much time as possible with your newborn is very important. During this time, you and your newborn can feel close and get to know each other. Having your newborn stay in your room (rooming in) will help to strengthen the bond with your newborn. It will give you time to get to know your newborn and become comfortable caring for your newborn.  Your hormones change after delivery. Sometimes the hormone changes can temporarily cause you to feel sad or tearful. These feelings should not last more than a few days. If these feelings last longer than that, you should talk to your caregiver.  If desired, talk to your caregiver about methods of family planning or contraception.  Talk to your caregiver about immunizations. Your caregiver may want you to have the following immunizations before leaving the hospital:  Tetanus, diphtheria, and pertussis (Tdap) or tetanus and diphtheria (Td) immunization. It is very important that you and your family (including grandparents) or others caring for your newborn are up-to-date with the Tdap or Td immunizations. The Tdap or Td immunization can help protect your  newborn from getting ill.  Rubella immunization.  Varicella (chickenpox) immunization.  Influenza immunization. You should receive this annual immunization if you did not receive the immunization during your pregnancy. Document Released: 09/06/2007 Document Revised: 08/03/2012 Document Reviewed: 07/06/2012 [facility] Patient Information 2014 [place].   Postpartum Depression and Baby Blues  The postpartum period begins right after the birth of a baby. During this time, there is often a great amount of joy and excitement. It is also a time of considerable changes in the life of the parent(s). Regardless of how many times a mother gives birth, each child brings new challenges and dynamics to the family. It is not unusual to have feelings of excitement accompanied by confusing shifts in moods, emotions, and thoughts. All mothers are at risk of developing postpartum depression or the "baby blues." These mood changes can occur right after giving birth, or they may occur many months after giving birth. The baby blues or postpartum depression can be mild or severe. Additionally, postpartum depression can resolve rather quickly, or it can be a long-term condition. CAUSES Elevated hormones and their rapid decline are thought to be a main cause of postpartum depression and the baby blues. There are a number of hormones that radically change during and after pregnancy. Estrogen and progesterone usually decrease immediately after delivering your baby. The level of thyroid hormone and various cortisol steroids also rapidly drop. Other factors that play a major role in these changes include major life events and genetics.  RISK FACTORS If you have any of the following risks for the baby blues or postpartum  depression, know what symptoms to watch out for during the postpartum period. Risk factors that may increase the likelihood of getting the baby blues or postpartum depression include:  Havinga personal  or family history of depression.  Having depression while being pregnant.  Having premenstrual or oral contraceptive-associated mood issues.  Having exceptional life stress.  Having marital conflict.  Lacking a social support network.  Having a baby with special needs.  Having health problems such as diabetes. SYMPTOMS Baby blues symptoms include:  Brief fluctuations in mood, such as going from extreme happiness to sadness.  Decreased concentration.  Difficulty sleeping.  Crying spells, tearfulness.  Irritability.  Anxiety. Postpartum depression symptoms typically begin within the first month after giving birth. These symptoms include:  Difficulty sleeping or excessive sleepiness.  Marked weight loss.  Agitation.  Feelings of worthlessness.  Lack of interest in activity or food. Postpartum psychosis is a very concerning condition and can be dangerous. Fortunately, it is rare. Displaying any of the following symptoms is cause for immediate medical attention. Postpartum psychosis symptoms include:  Hallucinations and delusions.  Bizarre or disorganized behavior.  Confusion or disorientation. DIAGNOSIS  A diagnosis is made by an evaluation of your symptoms. There are no medical or lab tests that lead to a diagnosis, but there are various questionnaires that a caregiver may use to identify those with the baby blues, postpartum depression, or psychosis. Often times, a screening tool called the [place] Postnatal Depression Scale is used to diagnose depression in the postpartum period.  TREATMENT The baby blues usually goes away on its own in 1 to 2 weeks. Social support is often all that is needed. You should be encouraged to get adequate sleep and rest. Occasionally, you may be given medicines to help you sleep.  Postpartum depression requires treatment as it can last several months or longer if it is not treated. Treatment may include individual or group therapy,  medicine, or both to address any social, physiological, and psychological factors that may play a role in the depression. Regular exercise, a healthy diet, rest, and social support may also be strongly recommended.  Postpartum psychosis is more serious and needs treatment right away. Hospitalization is often needed. HOME CARE INSTRUCTIONS  Get as much rest as you can. Nap when the baby sleeps.  Exercise regularly. Some women find yoga and walking to be beneficial.  Eat a balanced and nourishing diet.  Do little things that you enjoy. Have a cup of tea, take a bubble bath, read your favorite magazine, or listen to your favorite music.  Avoid alcohol.  Ask for help with household chores, cooking, grocery shopping, or running errands as needed. Do not try to do everything.  Talk to people close to you about how you are feeling. Get support from your partner, family members, friends, or other new moms.  Try to stay positive in how you think. Think about the things you are grateful for.  Do not spend a lot of time alone.  Only take medicine as directed by your caregiver.  Keep all your postpartum appointments.  Let your caregiver know if you have any concerns. SEEK MEDICAL CARE IF: You are having a reaction or problems with your medicine. SEEK IMMEDIATE MEDICAL CARE IF:  You have suicidal feelings.  You feel you may harm the baby or someone else. Document Released: 08/13/2004 Document Revised: 02/01/2012 Document Reviewed: 09/15/2011 [facility] Patient Information 2014 [place], [place].     Breastfeeding Deciding to breastfeed is one of the  best choices you can make for you and your baby. A change in hormones during pregnancy causes your breast tissue to grow and increases the number and size of your milk ducts. These hormones also allow proteins, sugars, and fats from your blood supply to make breast milk in your milk-producing glands. Hormones prevent breast milk from being  released before your baby is born as well as prompt milk flow after birth. Once breastfeeding has begun, thoughts of your baby, as well as his or her sucking or crying, can stimulate the release of milk from your milk-producing glands.  BENEFITS OF BREASTFEEDING For Your Baby  Your first milk (colostrum) helps your baby's digestive system function better.   There are antibodies in your milk that help your baby fight off infections.   Your baby has a lower incidence of asthma, allergies, and sudden infant death syndrome.   The nutrients in breast milk are better for your baby than infant formulas and are designed uniquely for your baby's needs.   Breast milk improves your baby's brain development.   Your baby is less likely to develop other conditions, such as childhood obesity, asthma, or type 2 diabetes mellitus.  For You   Breastfeeding helps to create a very special bond between you and your baby.   Breastfeeding is convenient. Breast milk is always available at the correct temperature and costs nothing.   Breastfeeding helps to burn calories and helps you lose the weight gained during pregnancy.   Breastfeeding makes your uterus contract to its prepregnancy size faster and slows bleeding (lochia) after you give birth.   Breastfeeding helps to lower your risk of developing type 2 diabetes mellitus, osteoporosis, and breast or ovarian cancer later in life. SIGNS THAT YOUR BABY IS HUNGRY Early Signs of Hunger  Increased alertness or activity.  Stretching.  Movement of the head from side to side.  Movement of the head and opening of the mouth when the corner of the mouth or cheek is stroked (rooting).  Increased sucking sounds, smacking lips, cooing, sighing, or squeaking.  Hand-to-mouth movements.  Increased sucking of fingers or hands. Late Signs of Hunger  Fussing.  Intermittent crying. Extreme Signs of Hunger Signs of extreme hunger will require calming  and consoling before your baby will be able to breastfeed successfully. Do not wait for the following signs of extreme hunger to occur before you initiate breastfeeding:   Restlessness.  A loud, strong cry.   Screaming.   BREASTFEEDING BASICS Breastfeeding Initiation  Find a comfortable place to sit or lie down, with your neck and back well supported.  Place a pillow or rolled up blanket under your baby to bring him or her to the level of your breast (if you are seated). Nursing pillows are specially designed to help support your arms and your baby while you breastfeed.  Make sure that your baby's abdomen is facing your abdomen.   Gently massage your breast. With your fingertips, massage from your chest wall toward your nipple in a circular motion. This encourages milk flow. You may need to continue this action during the feeding if your milk flows slowly.  Support your breast with 4 fingers underneath and your thumb above your nipple. Make sure your fingers are well away from your nipple and your baby's mouth.   Stroke your baby's lips gently with your finger or nipple.   When your baby's mouth is open wide enough, quickly bring your baby to your breast, placing your entire nipple  and as much of the colored area around your nipple (areola) as possible into your baby's mouth.   More areola should be visible above your baby's upper lip than below the lower lip.   Your baby's tongue should be between his or her lower gum and your breast.   Ensure that your baby's mouth is correctly positioned around your nipple (latched). Your baby's lips should create a seal on your breast and be turned out (everted).  It is common for your baby to suck about 2-3 minutes in order to start the flow of breast milk. Latching Teaching your baby how to latch on to your breast properly is very important. An improper latch can cause nipple pain and decreased milk supply for you and poor weight gain  in your baby. Also, if your baby is not latched onto your nipple properly, he or she may swallow some air during feeding. This can make your baby fussy. Burping your baby when you switch breasts during the feeding can help to get rid of the air. However, teaching your baby to latch on properly is still the best way to prevent fussiness from swallowing air while breastfeeding. Signs that your baby has successfully latched on to your nipple:    Silent tugging or silent sucking, without causing you pain.   Swallowing heard between every 3-4 sucks.    Muscle movement above and in front of his or her ears while sucking.  Signs that your baby has not successfully latched on to nipple:   Sucking sounds or smacking sounds from your baby while breastfeeding.  Nipple pain. If you think your baby has not latched on correctly, slip your finger into the corner of your baby's mouth to break the suction and place it between your baby's gums. Attempt breastfeeding initiation again. Signs of Successful Breastfeeding Signs from your baby:   A gradual decrease in the number of sucks or complete cessation of sucking.   Falling asleep.   Relaxation of his or her body.   Retention of a small amount of milk in his or her mouth.   Letting go of your breast by himself or herself. Signs from you:  Breasts that have increased in firmness, weight, and size 1-3 hours after feeding.   Breasts that are softer immediately after breastfeeding.  Increased milk volume, as well as a change in milk consistency and color by the fifth day of breastfeeding.   Nipples that are not sore, cracked, or bleeding. Signs That Your [employer] Books is Getting Enough Milk  Wetting at least 3 diapers in a 24-hour period. The urine should be clear and pale yellow by age 22 days.  At least 3 stools in a 24-hour period by age 22 days. The stool should be soft and yellow.  At least 3 stools in a 24-hour period by age 28 days. The  stool should be seedy and yellow.  No loss of weight greater than 10% of birth weight during the first 34 days of age.  Average weight gain of 4-7 ounces (113-198 g) per week after age 28 days.  Consistent daily weight gain by age 3 days, without weight loss after the age of 2 weeks. After a feeding, your baby may spit up a small amount. This is common. BREASTFEEDING FREQUENCY AND DURATION Frequent feeding will help you make more milk and can prevent sore nipples and breast engorgement. Breastfeed when you feel the need to reduce the fullness of your breasts or when your baby shows  signs of hunger. This is called "breastfeeding on demand." Avoid introducing a pacifier to your baby while you are working to establish breastfeeding (the first 4-6 weeks after your baby is born). After this time you may choose to use a pacifier. Research has shown that pacifier use during the first year of a baby's life decreases the risk of sudden infant death syndrome (SIDS). Allow your baby to feed on each breast as long as he or she wants. Breastfeed until your baby is finished feeding. When your baby unlatches or falls asleep while feeding from the first breast, offer the second breast. Because newborns are often sleepy in the first few weeks of life, you may need to awaken your baby to get him or her to feed. Breastfeeding times will vary from baby to baby. However, the following rules can serve as a guide to help you ensure that your baby is properly fed:  Newborns (babies 57 weeks of age or younger) may breastfeed every 1-3 hours.  Newborns should not go longer than 3 hours during the day or 5 hours during the night without breastfeeding.  You should breastfeed your baby a minimum of 8 times in a 24-hour period until you begin to introduce solid foods to your baby at around 74 months of age. BREAST MILK PUMPING Pumping and storing breast milk allows you to ensure that your baby is exclusively fed your breast milk,  even at times when you are unable to breastfeed. This is especially important if you are going back to work while you are still breastfeeding or when you are not able to be present during feedings. Your lactation consultant can give you guidelines on how long it is safe to store breast milk.  A breast pump is a machine that allows you to pump milk from your breast into a sterile bottle. The pumped breast milk can then be stored in a refrigerator or freezer. Some breast pumps are operated by hand, while others use electricity. Ask your lactation consultant which type will work best for you. Breast pumps can be purchased, but some hospitals and breastfeeding support groups lease breast pumps on a monthly basis. A lactation consultant can teach you how to hand express breast milk, if you prefer not to use a pump.  CARING FOR YOUR BREASTS WHILE YOU BREASTFEED Nipples can become dry, cracked, and sore while breastfeeding. The following recommendations can help keep your breasts moisturized and healthy:  Avoid using soap on your nipples.   Wear a supportive bra. Although not required, special nursing bras and tank tops are designed to allow access to your breasts for breastfeeding without taking off your entire bra or top. Avoid wearing underwire-style bras or extremely tight bras.  Air dry your nipples for 3-52minutes after each feeding.   Use only cotton bra pads to absorb leaked breast milk. Leaking of breast milk between feedings is normal.   Use lanolin on your nipples after breastfeeding. Lanolin helps to maintain your skin's normal moisture barrier. If you use pure lanolin, you do not need to wash it off before feeding your baby again. Pure lanolin is not toxic to your baby. You may also hand express a few drops of breast milk and gently massage that milk into your nipples and allow the milk to air dry. In the first few weeks after giving birth, some women experience extremely full breasts  (engorgement). Engorgement can make your breasts feel heavy, warm, and tender to the touch. Engorgement peaks within 3-5  days after you give birth. The following recommendations can help ease engorgement:  Completely empty your breasts while breastfeeding or pumping. You may want to start by applying warm, moist heat (in the shower or with warm water-soaked hand towels) just before feeding or pumping. This increases circulation and helps the milk flow. If your baby does not completely empty your breasts while breastfeeding, pump any extra milk after he or she is finished.  Wear a snug bra (nursing or regular) or tank top for 1-2 days to signal your body to slightly decrease milk production.  Apply ice packs to your breasts, unless this is too uncomfortable for you.  Make sure that your baby is latched on and positioned properly while breastfeeding. If engorgement persists after 48 hours of following these recommendations, contact your health care provider or a Science writer. OVERALL HEALTH CARE RECOMMENDATIONS WHILE BREASTFEEDING  Eat healthy foods. Alternate between meals and snacks, eating 3 of each per day. Because what you eat affects your breast milk, some of the foods may make your baby more irritable than usual. Avoid eating these foods if you are sure that they are negatively affecting your baby.  Drink milk, fruit juice, and water to satisfy your thirst (about 10 glasses a day).   Rest often, relax, and continue to take your prenatal vitamins to prevent fatigue, stress, and anemia.  Continue breast self-awareness checks.  Avoid chewing and smoking tobacco.  Avoid alcohol and drug use. Some medicines that may be harmful to your baby can pass through breast milk. It is important to ask your health care provider before taking any medicine, including all over-the-counter and prescription medicine as well as vitamin and herbal supplements. It is possible to become pregnant while  breastfeeding. If birth control is desired, ask your health care provider about options that will be safe for your baby. SEEK MEDICAL CARE IF:   You feel like you want to stop breastfeeding or have become frustrated with breastfeeding.  You have painful breasts or nipples.  Your nipples are cracked or bleeding.  Your breasts are red, tender, or warm.  You have a swollen area on either breast.  You have a fever or chills.  You have nausea or vomiting.  You have drainage other than breast milk from your nipples.  Your breasts do not become full before feedings by the fifth day after you give birth.  You feel sad and depressed.  Your baby is too sleepy to eat well.  Your baby is having trouble sleeping.   Your baby is wetting less than 3 diapers in a 24-hour period.  Your baby has less than 3 stools in a 24-hour period.  Your baby's skin or the white part of his or her eyes becomes yellow.   Your baby is not gaining weight by 24 days of age. SEEK IMMEDIATE MEDICAL CARE IF:   Your baby is overly tired (lethargic) and does not want to wake up and feed.  Your baby develops an unexplained fever. Document Released: 11/09/2005 Document Revised: 11/14/2013 Document Reviewed: 05/03/2013 [facility] Patient Information 2015 [place], [place]. This information is not intended to replace advice given to you by your health care provider. Make sure you discuss any questions you have with your health care provider.

## 2016-03-11 ENCOUNTER — Ambulatory Visit: Payer: Self-pay

## 2016-03-11 MED ORDER — IBUPROFEN 600 MG PO TABS: 600.0000 mg | ORAL_TABLET | Freq: Four times a day (QID) | ORAL | Status: DC

## 2016-03-11 MED ORDER — FERROUS SULFATE 325 (65 FE) MG PO TBEC: 325.0000 mg | DELAYED_RELEASE_TABLET | Freq: Two times a day (BID) | ORAL | Status: DC

## 2016-03-11 NOTE — Lactation Note (Signed)
This note was copied from a baby's chart. Lactation Consultation Note  Patient Name: Erica Roth [id number] Date: 03/11/2016     Baby 27 hours old. Mom preparing for d/c. Mom reports that she has not been in contact with [facility] office. This [place] called [place] office and asked that a peer counselor please call patient to discuss a possible appointment time to obtain a DEBP. Mom leaving hospital before speaking with [facility] office. Mom stated that she will call [facility] again and then she may just purchase a pump if needed. Mom aware that she can return to hospital as needed for a pump. Mom also aware of how to use her piston in her pump kit for hand pumping and enc to use DEBP in pumping room in NICU as well.   Mom reports that she pumped last night, but cannot remember what time, and that she has pumped once today. Reiterated the need to pump every 2-3 hours for 15 minutes for a total of 8 times/24 hours followed by hand expression. Mom states that she understands and has no questions at this time.      Maternal Data    Feeding    [facility] Score/Interventions                      Lactation Tools Discussed/Used     Consult Status      Erica Roth 03/11/2016, 4:39 PM

## 2016-03-11 NOTE — Progress Notes (Signed)
Pt ambulated out to nicu with husband  Teaching complete

## 2016-03-11 NOTE — Clinical Social Work Maternal (Signed)
CLINICAL SOCIAL WORK MATERNAL/CHILD NOTE  Patient Details  Name: Erica Roth MRN: [id number] Date of Birth: 05-10-1981  Date:  03/10/2016  Clinical Social Worker Initiating Note:  Marrio Scribner E. Brigitte Pulse, [place] Date/ Time Initiated:  03/10/16/1030     Child's Name:  Mariam    Legal Guardian:   (Parents: Jazmyne Burlingame and Abdinasasir Frederich Chick)   Need for Interpreter:  None   Date of Referral:        Reason for Referral:   (No referral-NICU admission)   Referral Source:      Address:  [address] [place], [place] [address]  Phone number:  [phone]   Household Members:  Spouse   Natural Supports (not living in the home):  Immediate Family, Extended Family (MOB reports that her husband has siblings in the area and that she has cousins in other states.  She reports that the majority of their families live in [place].)   Professional Supports: None   Employment:     Type of Work: MOB reports that she is not working and plans to stay home with baby.  She states FOB works at [employer].   Education:      Museum/gallery curator Resources:  Medicaid   Other Resources:      Cultural/Religious Considerations Which May Impact Care: None stated.  Strengths:  Ability to meet basic needs , Compliance with medical plan , Understanding of illness (MOB will obtain pediatrician list from NICU desk.  She states they have the means to get all needed supplies for infant prior to her discharge from NICU.)   Risk Factors/Current Problems:  None   Cognitive State:  Alert , Able to Concentrate , Linear Thinking , Goal Oriented    Mood/Affect:  Calm , Comfortable , Interested    CSW Assessment: CSW met with MOB in her third floor room/317 to introduce services, offer support, and complete assessment due to baby's admission to NICU at 34.3 weeks.  MOB was alone in the room, very pleasant when CSW arrived and welcoming of the visit.   MOB told CSW about her prenatal experience, spending the  majority of her pregnancy on bedrest due to leaking fluid starting "very early" in pregnancy.  She states it was not difficult being in the hospital, but that she was "very worried" about her baby throughout this time.  She states that she is "very happy" now that her baby is here and healthy.  She reports that is was a "good delivery" and that "it's nice" being a mom.  She states she understands there is no way to know how long baby will need to be hospitalized, but that as long as her baby is okay, she is not concerned with how long the process will take.  CSW encouraged her to focus on this statement and know that her baby's NICU admission is necessary and temporary.  CSW encouraged her to focus on bonding with her baby, despite the setting, rather than focusing on when she will be ready to go home.  MOB stated understanding and agreement.   MOB reports that her husband is her greatest support person and that most of their family still lives in [place].  She states her husband has lived in the [place] for approximately 16 years, and that she has been here since August of 2016.  She states they met through a friend and were married in March of 2016.  She states ability to obtain all needed supplies for infant prior to  her discharge.  CSW stressed the importance of getting a bed for baby and noted recommendation that baby not sleep in bed with parents due to risk of suffocation.  MOB stated understanding and states they will be able to get a bed for baby.   MOB reports feeling comfortable with care baby is receiving in NICU and states feeling well informed.  CSW answered questions in very general terms regarding feeding a premature baby.   CSW provided education regarding signs and symptoms of perinatal mood disorders and stressed the importance of talking with a medical professional if she has any questions or concerns at any time.  MOB stated understanding.   CSW has no social concerns at this time and informed  MOB of ongoing support services offered by NICU CSW.  CSW gave contact information and asked MOB to call any time.  MOB seemed appreciative of support offered.  CSW Plan/Description:  Patient/Family Education , Psychosocial Support and Ongoing Assessment of Needs    Alphonzo Cruise, [place] 03/10/2016, 11:00 AM

## 2016-03-13 ENCOUNTER — Ambulatory Visit: Payer: Self-pay

## 2016-03-13 NOTE — Lactation Note (Signed)
This note was copied from a baby's chart. Lactation Consultation Note  Patient Name: Erica Roth [id number] Date: 03/13/2016 Reason for consult: Follow-up assessment;NICU baby;Infant < 6lbs;Late preterm infant   Called to bedside to assist with BF. Infant now 56 hours old. Mom holding infant STS when I arrived at bedside. Mom reports she had latched infant to right side and did not believe infant was getting milk. Mom reports her breast are feeling fuller today, they were noted to be lumpy, not engorged. Mom reports she has not pumped since Wednesday and that she did not take a pump home and has not been using hospital pumps when visiting NICU. Mom is a [facility] client and is going to [facility] at 1 pm, advised mom to take pump parts with her to [facility] and to use pumps when visiting infant in NICU.  Infant was awake and rooting, we latched her to left breast. Infant with good strong rhythmic suckling and intermittent swallows. Infant noted to be swallowing with feed and breast was noted to soften with feeding. Enc mom to massage breast with feeding. Infant was supplemented with formula post feeding. Mom has BM storage supplies and BM labels.   Discussed supply and demand with mom and advised her to pump every 2 hours for 15 minutes and to BF infant/STS when she is here and infant able to BF. Mom voiced understanding.     Maternal Data Does the patient have breastfeeding experience prior to this delivery?: No  Feeding Feeding Type: Formula Nipple Type: Slow - flow Length of feed: 20 min  LATCH Score/Interventions Latch: Grasps breast easily, tongue down, lips flanged, rhythmical sucking.  Audible Swallowing: Spontaneous and intermittent  Type of Nipple: Everted at rest and after stimulation  Comfort (Breast/Nipple): Soft / non-tender     Hold (Positioning): Assistance needed to correctly position infant at breast and maintain latch.  LATCH Score: 9  Lactation Tools Discussed/Used WIC  Program: Yes Pump Review: Setup, frequency, and cleaning;Milk Storage   Consult Status Consult Status: Follow-up Follow-up type: Call as needed    Donn Pierini 03/13/2016, 1:10 PM

## 2016-04-23 ENCOUNTER — Ambulatory Visit (HOSPITAL_COMMUNITY): Admission: RE | Admit: 2016-04-23 | Payer: Medicaid Other | Source: Ambulatory Visit

## 2016-04-24 ENCOUNTER — Ambulatory Visit (HOSPITAL_COMMUNITY): Admission: RE | Admit: 2016-04-24 | Payer: Medicaid Other | Source: Ambulatory Visit

## 2016-04-24 ENCOUNTER — Ambulatory Visit (HOSPITAL_COMMUNITY)
Admission: RE | Admit: 2016-04-24 | Discharge: 2016-04-24 | Disposition: A | Discharge status: Home / Self Care | Payer: Medicaid Other | Source: Ambulatory Visit | Attending: Obstetrics and Gynecology | Admitting: Obstetrics and Gynecology

## 2016-04-24 NOTE — Lactation Note (Signed)
Lactation Consult   With this now term baby, 27 weeks old, born at 68 3/[redacted] weeks gestation. Mom has virtually lost her milk supply, so the baby did not transfer milk at the breast. 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 Mom given resources to get information on tongue tie. She has decided to rent a DEP, and try to restart her milk supply. with pumping today, she expressed less than 1 ml.  The baby is gaining well of formula at this time.   Mother's reason for visit: low milk supply, feeding assessment Visit Type:  Outpatient lactation consult Appointment Notes: lost milk supply Consult:  Follow-Up Lactation Consultant:  Tonna Corner  ________________________________________________________________________   Erica Roth Name: Erica Roth Date of Birth: 03/09/2014 [facility]ian: [facility] Gender: female Gestational Age: [redacted]w[redacted]d (At Birth) Birth Weight: 4 lb 0.9 oz (1840 g) Weight at Discharge:  Weight: (!) 5 lb 1.3 oz (2306 g) Date of Discharge: 03/26/2016 Filed Weights   03/23/16 1500 03/24/16 1530 03/25/16 1400  Weight: 4 lb 15.2 oz (2245 g) 5 lb 1.1 oz (2300 g) 5 lb 1.3 oz (2306 g)   Last weight taken from location outside of [facility]:6 lbs 4 oz Location at pediatricians Weight today: - 3538 grams with diaper and hip brace, 7 lbs 12. 8 oz    ________________________________________________________________________  Mother's Name: Erica Roth Type of delivery:  vaginal Breastfeeding Experience:first baby Maternal Medical Conditions:  none Maternal Medications: none  ________________________________________________________________________  Breastfeeding History (Post Discharge)  Frequency of breastfeeding:  8-9  times a day, prior to formula feeding, on cue Duration of feeding:  20 minutes  Supplementation  Formula:  Volume 113ml Frequency: 8-9 times  a day Total volume per day:960 ml's - 1280  ml       Brand: Similac  Breastmilk:  Volume 91ml Frequency:  At breast only Total volume per day:  18ml  Method:  Bottle  Infant Intake and Output Assessment  Voids:  Each diaper change in 24 hrs.  Color:  Clear yellow Stools:  1 every 1-2 days in.  Color:  Brown and Yellow  ________________________________________________________________________  Maternal Breast Assessment  Breast:  Soft Nipple:  Erect Pain level:  0 Pain interventions:  none  _______________________________________________________________________ Feeding Assessment/Evaluation  Initial feeding assessment:  Infant's oral assessment:  Variance  Positioning:  Cross cradle Left breast  LATCH documentation:  Latch:  1 = Repeated attempts needed to sustain latch, nipple held in mouth throughout feeding, stimulation needed to elicit sucking reflex.  Audible swallowing:  0 = None  Type of nipple:  2 = Everted at rest and after stimulation  Comfort (Breast/Nipple):  2 = Soft / non-tender  Hold (Positioning):  1 = Assistance needed to correctly position infant at breast and maintain latch  LATCH score:  6  Attached assessment:  Deep  Lips flanged:  Yes.    Lips untucked:  Yes.    Suck assessment:  Nonnutritive  Tools:  Pump Instructed on use and cleaning of tool:  No.  Pre-feed weight: 3538g  7 lb. 12.8 oz.) Post-feed weight: 3538 g (7 lb. 12.8 oz.) Amount transferred:  0 ml Amount supplemented: 120 ml  No  Total amount pumped post feed:  R drops ml    L drops ml   Total amount transferred: 42ml Total supplement given: 120 ml

## 2016-07-28 IMAGING — US US MFM OB LIMITED
1 series · 15 of 27 positions shown · non-contrast
Comparison: none

[Series 1: us mfm ob limited · 27 acquisitions, 15 frames shown]
[im 1/27]
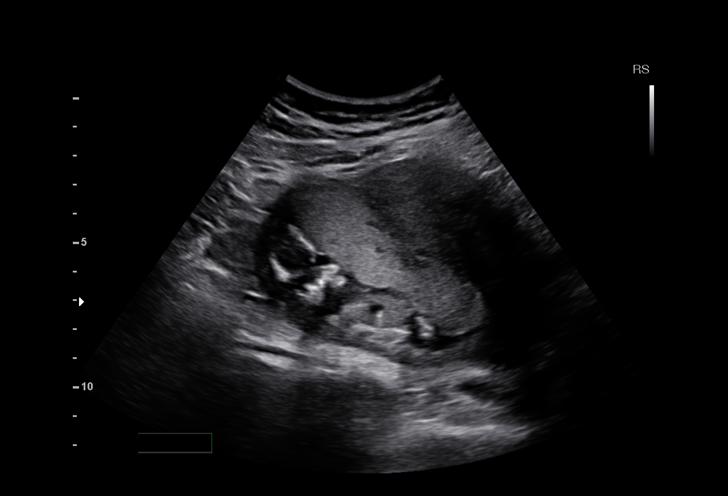
[im 3/27]
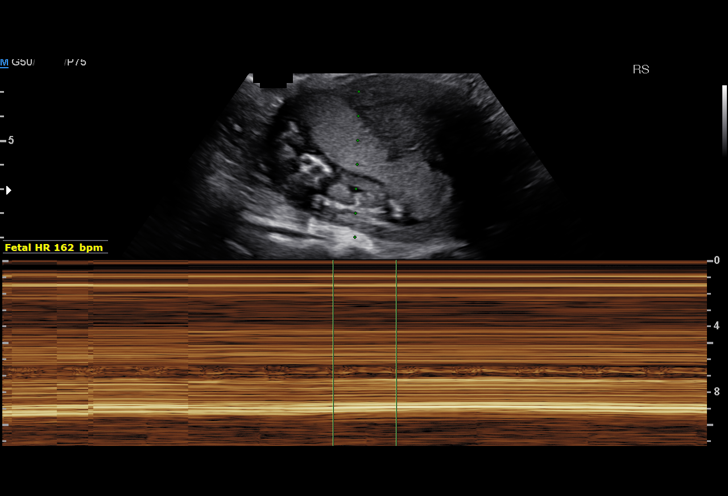
[im 5/27]
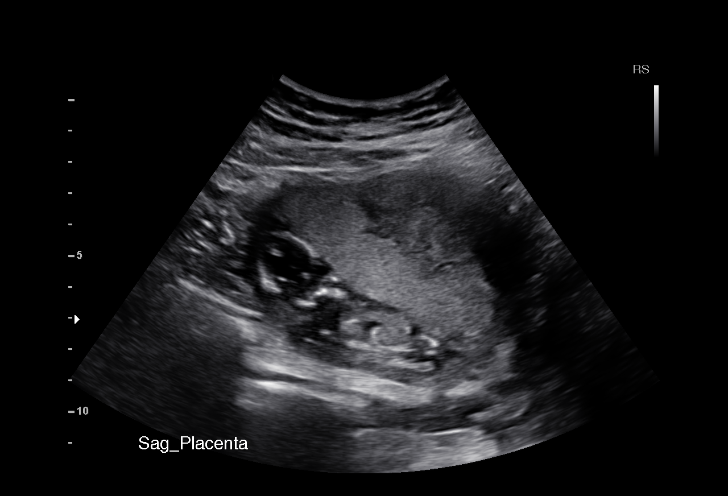
[im 7/27]
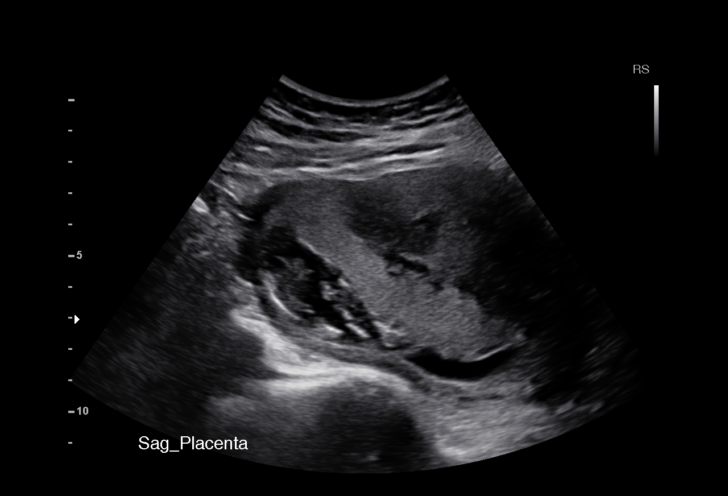
[im 9/27]
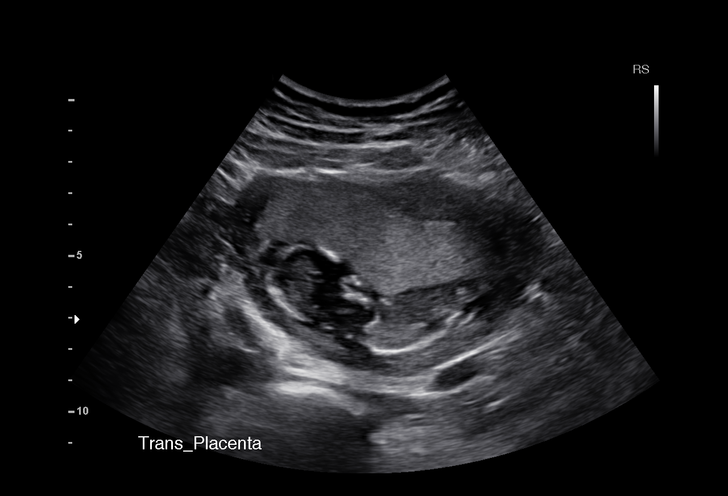
[im 10/27]
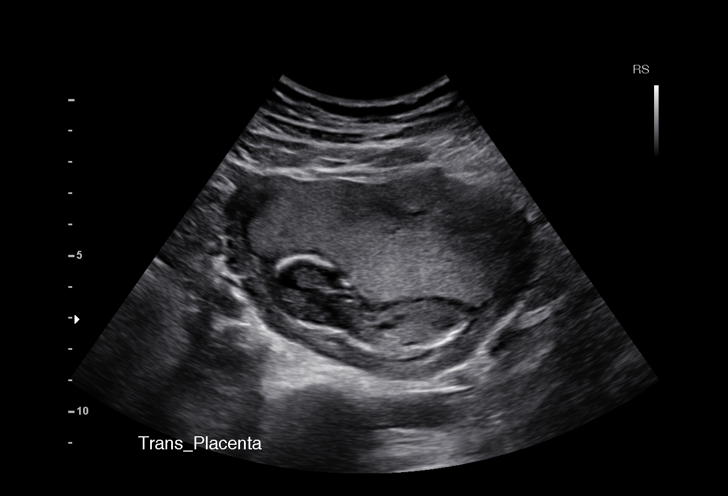
[im 12/27]
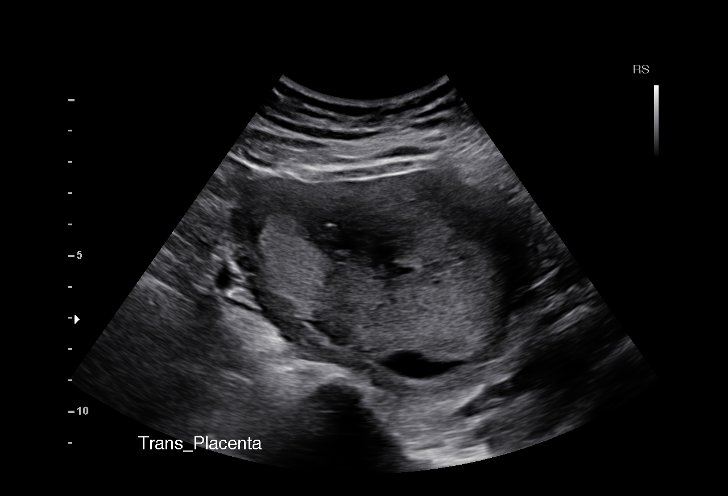
[im 14/27]
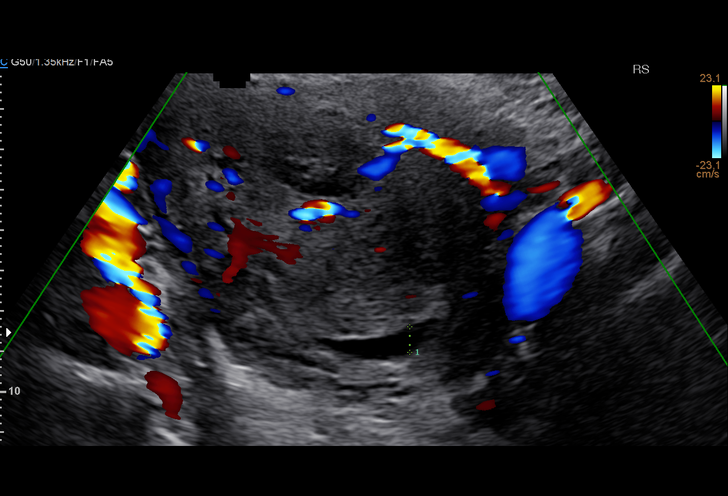
[im 16/27]
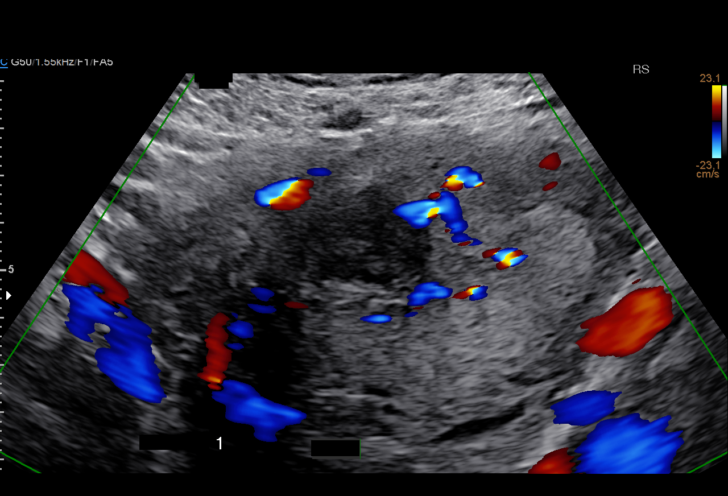
[im 18/27]
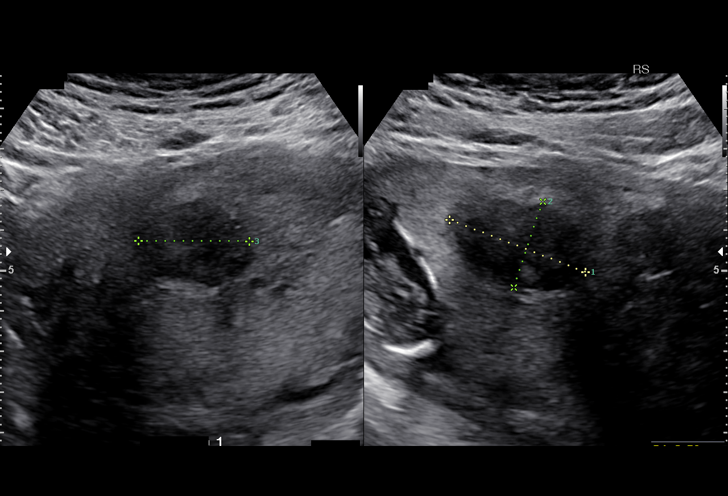
[im 19/27]
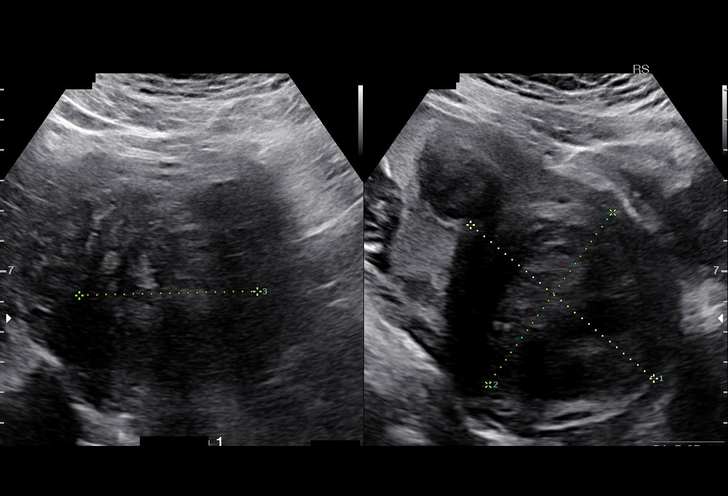
[im 21/27]
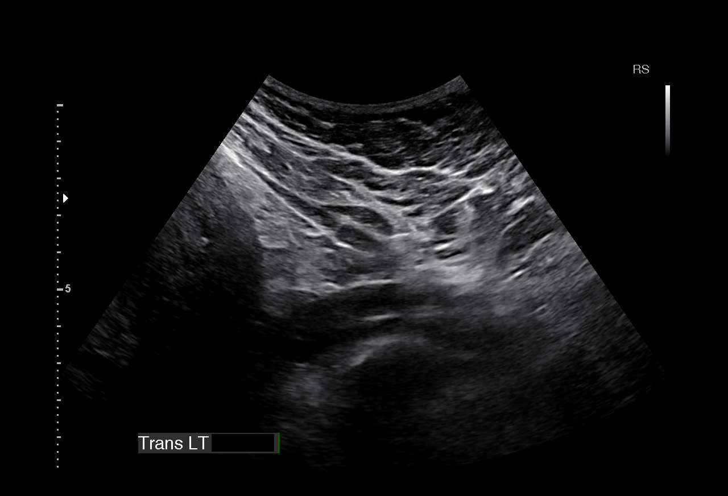
[im 23/27]
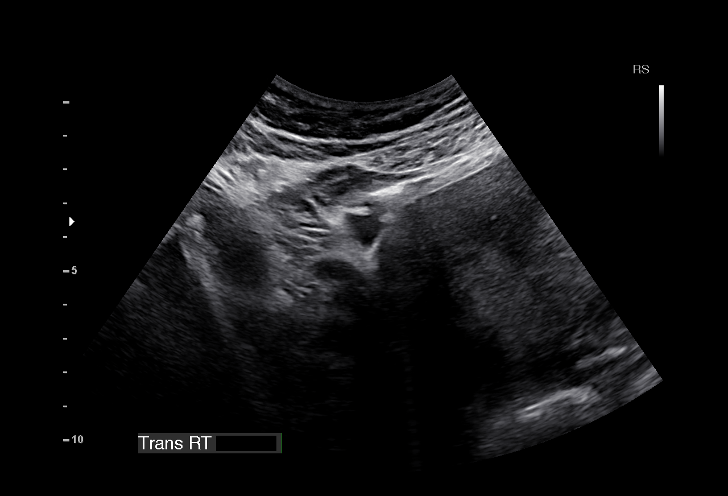
[im 25/27]
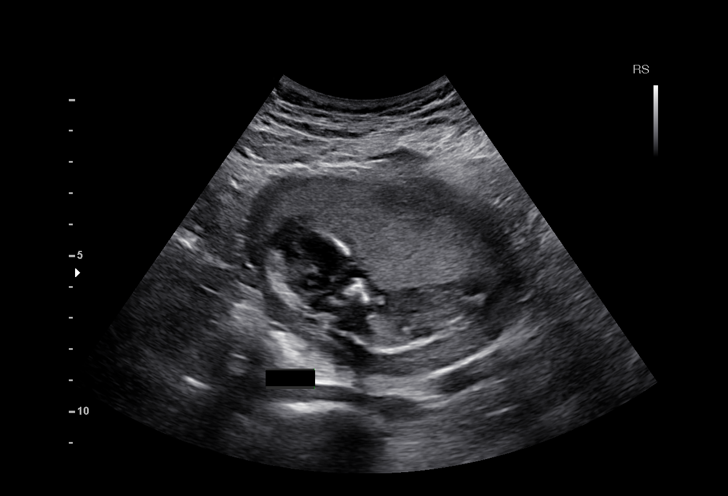
[im 27/27]
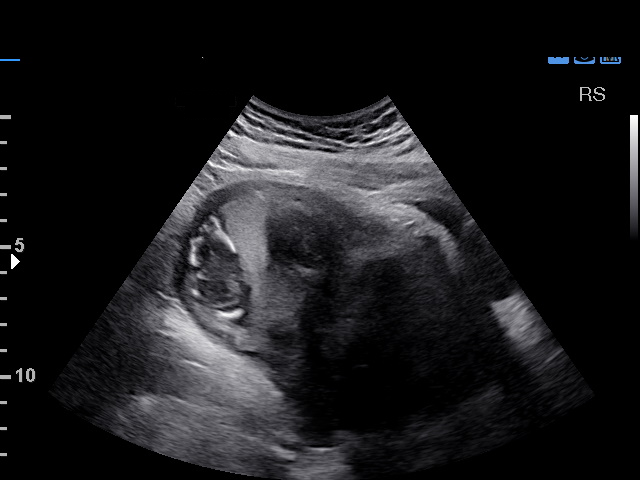

[15 of 27 positions shown; findings below may reference images not displayed]

OBSTETRICS REPORT
(Signed Final 10/19/2015 [DATE])

Service(s) Provided

US MFM OB LIMITED                                     76815.01
Indications

14 weeks gestation of pregnancy
Leakage of amniotic fluid
Uterine fibroids
Fetal Evaluation

Num Of Fetuses:    1
Fetal Heart Rate:  162                          bpm
Cardiac Activity:  Observed
Presentation:      Breech
Placenta:          Anterior

Amniotic Fluid
AFI FV:      Oligohydramnios
Larg Pckt:    0.66  cm
Gestational Age

LMP:           14w 0d        Date:  07/12/15                 EDD:   04/17/16
Best:          14w 0d     Det. By:  LMP  (07/12/15)          EDD:   04/17/16
Cervix Uterus Adnexa

Uterus:       Multiple fibroids noted, see table below.
Left Ovary:    Not visualized.
Right Ovary:   Not visualized.
Adnexa:     No abnormality visualized. No adnexal mass visualized.
Myomas

Site                     L(cm)      W(cm)       D(cm)      Location
Anterior
Anterior, LUS

Blood Flow                  RI       PI       Comments
Impression

Singleton intrauterine pregnancy at 14 weeks 0 days
gestation with fetal cardaic activity
Leiomyomata as noted above
Minimal amniotic fluid noted at this early gestational age
Recommendations

Follow-up ultrasounds as clinically indicated.

questions or concerns.

## 2016-09-02 ENCOUNTER — Encounter (HOSPITAL_COMMUNITY): Payer: Self-pay | Admitting: *Deleted

## 2016-09-02 ENCOUNTER — Inpatient Hospital Stay (HOSPITAL_COMMUNITY)
Admission: AD | Admit: 2016-09-02 | Discharge: 2016-09-02 | Disposition: A | Discharge status: Home / Self Care | Payer: Medicaid Other | Source: Ambulatory Visit | Attending: Obstetrics and Gynecology | Admitting: Obstetrics and Gynecology

## 2016-09-02 DIAGNOSIS — K5900 Constipation, unspecified: Secondary | ICD-10-CM | POA: Insufficient documentation

## 2016-09-02 DIAGNOSIS — O210 Mild hyperemesis gravidarum: Secondary | ICD-10-CM | POA: Insufficient documentation

## 2016-09-02 DIAGNOSIS — Z3A01 Less than 8 weeks gestation of pregnancy: Secondary | ICD-10-CM | POA: Insufficient documentation

## 2016-09-02 DIAGNOSIS — O219 Vomiting of pregnancy, unspecified: Secondary | ICD-10-CM | POA: Diagnosis not present

## 2016-09-02 DIAGNOSIS — K5909 Other constipation: Secondary | ICD-10-CM

## 2016-09-02 DIAGNOSIS — Z3201 Encounter for pregnancy test, result positive: Secondary | ICD-10-CM

## 2016-09-02 LAB — URINALYSIS, ROUTINE W REFLEX MICROSCOPIC
BILIRUBIN URINE: NEGATIVE
Glucose, UA: NEGATIVE mg/dL
Ketones, ur: NEGATIVE mg/dL
NITRITE: NEGATIVE
PROTEIN: NEGATIVE mg/dL
SPECIFIC GRAVITY, URINE: 1.01 (ref 1.005–1.030)
pH: 6 (ref 5.0–8.0)

## 2016-09-02 LAB — POCT PREGNANCY, URINE: Preg Test, Ur: POSITIVE — AB

## 2016-09-02 LAB — URINE MICROSCOPIC-ADD ON: BACTERIA UA: NONE SEEN

## 2016-09-02 MED ORDER — METOCLOPRAMIDE HCL 10 MG PO TABS: 10.0000 mg | ORAL_TABLET | Freq: Three times a day (TID) | ORAL | 2 refills | Status: DC

## 2016-09-02 NOTE — MAU Note (Signed)
C/o N&V for past 2 weeks; missed her period last month on the 22nd; LNMP was 07/14/9016;

## 2016-09-02 NOTE — MAU Note (Signed)
Pt is refusing vaginal exam; desires to have an ultrasound; needs a verification letter for insurance

## 2016-09-02 NOTE — Discharge Instructions (Signed)
Constipation, Adult Constipation is when a person has fewer than three bowel movements a week, has difficulty having a bowel movement, or has stools that are dry, hard, or larger than normal. As people grow older, constipation is more common. A low-fiber diet, not taking in enough fluids, and taking certain medicines may make constipation worse.  CAUSES   Certain medicines, such as antidepressants, pain medicine, iron supplements, antacids, and water pills.   Certain diseases, such as diabetes, irritable bowel syndrome (IBS), thyroid disease, or depression.   Not drinking enough water.   Not eating enough fiber-rich foods.   Stress or travel.   Lack of physical activity or exercise.   Ignoring the urge to have a bowel movement.   Using laxatives too much.  SIGNS AND SYMPTOMS   Having fewer than three bowel movements a week.   Straining to have a bowel movement.   Having stools that are hard, dry, or larger than normal.   Feeling full or bloated.   Pain in the lower abdomen.   Not feeling relief after having a bowel movement.  DIAGNOSIS  Your health care provider will take a medical history and perform a physical exam. Further testing may be done for severe constipation. Some tests may include:  A barium enema X-ray to examine your rectum, colon, and, sometimes, your small intestine.   A sigmoidoscopy to examine your lower colon.   A colonoscopy to examine your entire colon. TREATMENT  Treatment will depend on the severity of your constipation and what is causing it. Some dietary treatments include drinking more fluids and eating more fiber-rich foods. Lifestyle treatments may include regular exercise. If these diet and lifestyle recommendations do not help, your health care provider may recommend taking over-the-counter laxative medicines to help you have bowel movements. Prescription medicines may be prescribed if over-the-counter medicines do not work.    HOME CARE INSTRUCTIONS   Eat foods that have a lot of fiber, such as fruits, vegetables, whole grains, and beans.  Limit foods high in fat and processed sugars, such as french fries, hamburgers, cookies, candies, and soda.   A fiber supplement may be added to your diet if you cannot get enough fiber from foods.   Drink enough fluids to keep your urine clear or pale yellow.   Exercise regularly or as directed by your health care provider.   Go to the restroom when you have the urge to go. Do not hold it.   Only take over-the-counter or prescription medicines as directed by your health care provider. Do not take other medicines for constipation without talking to your health care provider first.  [place] IF:   You have bright red blood in your stool.   Your constipation lasts for more than 4 days or gets worse.   You have abdominal or rectal pain.   You have thin, pencil-like stools.   You have unexplained weight loss. MAKE SURE YOU:   Understand these instructions.  Will watch your condition.  Will get help right away if you are not doing well or get worse.   This information is not intended to replace advice given to you by your health care provider. Make sure you discuss any questions you have with your health care provider.   Document Released: 08/07/2004 Document Revised: 11/30/2014 Document Reviewed: 08/21/2013 Elsevier Interactive Patient Education 2016 Elsevier Inc.   Morning Sickness Morning sickness is when you feel sick to your stomach (nauseous) during pregnancy. This nauseous feeling  may or may not come with vomiting. It often occurs in the morning but can be a problem any time of day. Morning sickness is most common during the first trimester, but it may continue throughout pregnancy. While morning sickness is unpleasant, it is usually harmless unless you develop severe and continual vomiting (hyperemesis gravidarum). This  condition requires more intense treatment.  CAUSES  The cause of morning sickness is not completely known but seems to be related to normal hormonal changes that occur in pregnancy. RISK FACTORS You are at greater risk if you:  Experienced nausea or vomiting before your pregnancy.  Had morning sickness during a previous pregnancy.  Are pregnant with more than one baby, such as twins. TREATMENT  Do not use any medicines (prescription, over-the-counter, or herbal) for morning sickness without first talking to your health care provider. Your health care provider may prescribe or recommend:  Vitamin B6 supplements.  Anti-nausea medicines.  The herbal medicine ginger. HOME CARE INSTRUCTIONS   Only take over-the-counter or prescription medicines as directed by your health care provider.  Taking multivitamins before getting pregnant can prevent or decrease the severity of morning sickness in most women.  Eat a piece of dry toast or unsalted crackers before getting out of bed in the morning.  Eat five or six small meals a day.  Eat dry and bland foods (rice, baked potato). Foods high in carbohydrates are often helpful.  Do not drink liquids with your meals. Drink liquids between meals.  Avoid greasy, fatty, and spicy foods.  Get someone to cook for you if the smell of any food causes nausea and vomiting.  If you feel nauseous after taking prenatal vitamins, take the vitamins at night or with a snack.  Snack on protein foods (nuts, yogurt, cheese) between meals if you are hungry.  Eat unsweetened gelatins for desserts.  Wearing an acupressure wristband (worn for sea sickness) may be helpful.  Acupuncture may be helpful.  Do not smoke.  Get a humidifier to keep the air in your house free of odors.  Get plenty of fresh air. SEEK MEDICAL CARE IF:   Your home remedies are not working, and you need medicine.  You feel dizzy or lightheaded.  You are losing weight. SEEK  IMMEDIATE MEDICAL CARE IF:   You have persistent and uncontrolled nausea and vomiting.  You pass out (faint). MAKE SURE YOU:  Understand these instructions.  Will watch your condition.  Will get help right away if you are not doing well or get worse.   This information is not intended to replace advice given to you by your health care provider. Make sure you discuss any questions you have with your health care provider.   Document Released: 12/31/2006 Document Revised: 11/14/2013 Document Reviewed: 04/26/2013 Elsevier Interactive Patient Education [employer].

## 2016-09-02 NOTE — MAU Provider Note (Signed)
History     CSN: [id number]  Arrival date and time: 09/02/16 1803   First Provider Initiated Contact with Patient 09/02/16 1848      Chief Complaint  Patient presents with  . Nausea  . Morning Sickness  . Possible Pregnancy   HPI   Erica Roth is a 35 y.o. female G22P0101 @ [redacted]w[redacted]d here with nausea, and possible pregnancy. She had a positive pregnancy test at home. She has been nauseated, with some vomiting. Her previous pregnancy she experienced ROM @ 13 weeks and delivered in April (healthy baby). She denies abdominal pain or vaginal bleeding.   OB History    Gravida Para Term Preterm AB Living   2 1   1   1    SAB TAB Ectopic Multiple Live Births         0 1      Past Medical History:  Diagnosis Date  . Fibroid     Past Surgical History:  Procedure Laterality Date  . NO PAST SURGERIES      Family History  Problem Relation Age of Onset  . Hypertension Mother     Social History  Substance Use Topics  . Smoking status: Never Smoker  . Smokeless tobacco: Never Used  . Alcohol use No    Allergies: No Known Allergies  Prescriptions Prior to Admission  Medication Sig Dispense Refill Last Dose  . famotidine (PEPCID AC) 10 MG chewable tablet Chew 10 mg by mouth 2 (two) times daily.   09/01/2016 at Unknown time  . Prenatal Vit-Fe Fumarate-FA (PRENATAL MULTIVITAMIN) TABS tablet Take 1 tablet by mouth daily at 12 noon.   09/02/2016 at Unknown time  . ferrous sulfate 325 (65 FE) MG EC tablet Take 1 tablet (325 mg total) by mouth 2 (two) times daily. (Patient not taking: Reported on 09/02/2016) 60 tablet 3 Not Taking at Unknown time  . ibuprofen (ADVIL,MOTRIN) 600 MG tablet Take 1 tablet (600 mg total) by mouth every 6 (six) hours. (Patient not taking: Reported on 09/02/2016) 30 tablet 0 Not Taking at Unknown time   Results for orders placed or performed during the hospital encounter of 09/02/16 (from the past 48 hour(s))  Urinalysis, Routine w reflex microscopic  (not at [facility])     Status: Abnormal   Collection Time: 09/02/16  6:10 PM  Result Value Ref Range   Color, Urine YELLOW YELLOW   APPearance CLEAR CLEAR   Specific Gravity, Urine 1.010 1.005 - 1.030   pH 6.0 5.0 - 8.0   Glucose, UA NEGATIVE NEGATIVE mg/dL   Hgb urine dipstick MODERATE (A) NEGATIVE   Bilirubin Urine NEGATIVE NEGATIVE   Ketones, ur NEGATIVE NEGATIVE mg/dL   Protein, ur NEGATIVE NEGATIVE mg/dL   Nitrite NEGATIVE NEGATIVE   Leukocytes, UA TRACE (A) NEGATIVE  Urine microscopic-add on     Status: Abnormal   Collection Time: 09/02/16  6:10 PM  Result Value Ref Range   Squamous Epithelial / LPF 0-5 (A) NONE SEEN   WBC, UA 0-5 0 - 5 WBC/hpf   RBC / HPF 0-5 0 - 5 RBC/hpf   Bacteria, UA NONE SEEN NONE SEEN  Pregnancy, urine POC     Status: Abnormal   Collection Time: 09/02/16  6:31 PM  Result Value Ref Range   Preg Test, Ur POSITIVE (A) NEGATIVE    Comment:        THE SENSITIVITY OF THIS METHODOLOGY IS >24 mIU/mL     Review of Systems  Constitutional: Negative for chills and  fever.  Gastrointestinal: Positive for constipation, heartburn, nausea and vomiting. Negative for abdominal pain.  Genitourinary: Negative for dysuria and urgency.   Physical Exam   Blood pressure 133/69, pulse 76, temperature 98.2 F (36.8 C), temperature source Oral, resp. rate 16, last menstrual period 07/14/2016, unknown if currently breastfeeding.  Physical Exam  Constitutional: She is oriented to person, place, and time. She appears well-developed and well-nourished. No distress.  Musculoskeletal: Normal range of motion.  Neurological: She is alert and oriented to person, place, and time.  Skin: Skin is warm. She is not diaphoretic.  Psychiatric: Her behavior is normal.    MAU Course  Procedures  None  MDM  UPT UA   Assessment and Plan   A:  1. Nausea and vomiting in pregnancy   2. Other constipation   3. Encounter for pregnancy test, result positive     P:  Discharge  home in stable condition  Discussed over the counter constipation meds that are safe to take in pregnancy. Rx: Reglan  Return to MAU as needed, if symptoms worsen Start prenatal care.    Lezlie Lye, NP 09/02/2016 7:35 PM

## 2016-11-02 ENCOUNTER — Other Ambulatory Visit (HOSPITAL_COMMUNITY)
Admission: RE | Admit: 2016-11-02 | Discharge: 2016-11-02 | Disposition: A | Discharge status: Home / Self Care | Payer: Medicaid Other | Source: Ambulatory Visit | Attending: Obstetrics and Gynecology | Admitting: Obstetrics and Gynecology

## 2016-11-02 ENCOUNTER — Encounter: Payer: Self-pay | Admitting: Obstetrics and Gynecology

## 2016-11-02 ENCOUNTER — Ambulatory Visit (INDEPENDENT_AMBULATORY_CARE_PROVIDER_SITE_OTHER): Payer: Medicaid Other | Admitting: Obstetrics and Gynecology

## 2016-11-02 DIAGNOSIS — O09212 Supervision of pregnancy with history of pre-term labor, second trimester: Secondary | ICD-10-CM

## 2016-11-02 DIAGNOSIS — O09899 Supervision of other high risk pregnancies, unspecified trimester: Secondary | ICD-10-CM | POA: Insufficient documentation

## 2016-11-02 DIAGNOSIS — Z113 Encounter for screening for infections with a predominantly sexual mode of transmission: Secondary | ICD-10-CM | POA: Diagnosis not present

## 2016-11-02 DIAGNOSIS — O0990 Supervision of high risk pregnancy, unspecified, unspecified trimester: Secondary | ICD-10-CM | POA: Insufficient documentation

## 2016-11-02 DIAGNOSIS — O09219 Supervision of pregnancy with history of pre-term labor, unspecified trimester: Secondary | ICD-10-CM

## 2016-11-02 NOTE — Progress Notes (Signed)
  Subjective:    Erica Roth is a G98P0101 [redacted]w[redacted]d being seen today for her first obstetrical visit.  Her obstetrical history is significant for previous preterm delivery at 34 weeks secondary to PPROM at 14 weeks. [place] also complicated by short interval between pregnancies- SVD 02/2016. Patient does intend to breast feed. Pregnancy history fully reviewed.  Patient reports no complaints.  There were no vitals filed for this visit.  HISTORY: OB History  Gravida Para Term Preterm AB Living  2 1   1   1   SAB TAB Ectopic Multiple Live Births        0 1    # Outcome Date GA Lbr Len/2nd Weight Sex Delivery Anes PTL Lv  2 Current           1 Preterm 03/09/16 [redacted]w[redacted]d 00:42 / 01:23 4 lb 0.9 oz (1.84 kg) F Vag-Spont EPI  LIV     Birth Comments: skeletal deformity in right leg     Past Medical History:  Diagnosis Date  . Fibroid   . Preterm labor    Past Surgical History:  Procedure Laterality Date  . NO PAST SURGERIES     Family History  Problem Relation Age of Onset  . Hypertension Mother      Exam    Uterus:   16- weeks uterus  Pelvic Exam:    Perineum: No Hemorrhoids, Normal Perineum   Vulva: normal   Vagina:  normal mucosa, normal discharge   pH:    Cervix: multiparous appearance   Adnexa: normal adnexa and no mass, fullness, tenderness   Bony Pelvis: gynecoid  System: Breast:  normal appearance, no masses or tenderness   Skin: normal coloration and turgor, no rashes    Neurologic: oriented, no focal deficits   Extremities: normal strength, tone, and muscle mass   HEENT extra ocular movement intact   Mouth/Teeth mucous membranes moist, pharynx normal without lesions and dental hygiene good   Neck supple and no masses   Cardiovascular: regular rate and rhythm   Respiratory:  chest clear, no wheezing, crepitations, rhonchi, normal symmetric air entry   Abdomen: soft, non-tender; bowel sounds normal; no masses,  no organomegaly   Urinary:       Assessment:    Pregnancy: G2P0101 Patient Active Problem List   Diagnosis Date Noted  . Supervision of high risk pregnancy, antepartum 11/02/2016  . Short interval between pregnancies affecting pregnancy, antepartum 11/02/2016  . History of preterm delivery, currently pregnant 11/02/2016  . Fibroids 10/26/2015        Plan:     Initial labs drawn. Prenatal vitamins. Problem list reviewed and updated. Genetic Screening discussed Quad Screen: declined.  Ultrasound discussed; fetal survey: ordered. Patient with history of PPROM with preterm delivery at 34 weeks- discussed the benefits of weekly 17-P. Patient undecided at this time. Patient did not want to complete the form today in the event that she changes her mind Patient declined flu vaccine Anatomy ultrasound ordered  Follow up in 4 weeks. 50% of 30 min visit spent on counseling and coordination of care.     Erica Roth 11/02/2016

## 2016-11-04 LAB — CULTURE, OB URINE

## 2016-11-04 LAB — GC/CHLAMYDIA PROBE AMP (~~LOC~~) NOT AT ARMC
CHLAMYDIA, DNA PROBE: NEGATIVE
NEISSERIA GONORRHEA: NEGATIVE

## 2016-11-04 LAB — URINE CULTURE, OB REFLEX

## 2016-11-06 LAB — TOXASSURE SELECT 13 (MW), URINE

## 2016-11-09 LAB — OBSTETRIC PANEL, INCLUDING HIV
ANTIBODY SCREEN: NEGATIVE
BASOS: 0 %
Basophils Absolute: 0 10*3/uL (ref 0.0–0.2)
EOS (ABSOLUTE): 0.2 10*3/uL (ref 0.0–0.4)
EOS: 2 %
HEP B S AG: NEGATIVE
HIV Screen 4th Generation wRfx: NONREACTIVE
Hematocrit: 36.2 % (ref 34.0–46.6)
Hemoglobin: 11.9 g/dL (ref 11.1–15.9)
IMMATURE GRANS (ABS): 0 10*3/uL (ref 0.0–0.1)
Immature Granulocytes: 0 %
LYMPHS: 30 %
Lymphocytes Absolute: 2.4 10*3/uL (ref 0.7–3.1)
MCH: 29.2 pg (ref 26.6–33.0)
MCHC: 32.9 g/dL (ref 31.5–35.7)
MCV: 89 fL (ref 79–97)
MONOCYTES: 5 %
MONOS ABS: 0.4 10*3/uL (ref 0.1–0.9)
Neutrophils Absolute: 5 10*3/uL (ref 1.4–7.0)
Neutrophils: 63 %
Platelets: 223 10*3/uL (ref 150–379)
RBC: 4.08 x10E6/uL (ref 3.77–5.28)
RDW: 14.9 % (ref 12.3–15.4)
RH TYPE: POSITIVE
RPR Ser Ql: NONREACTIVE
RUBELLA: 12.2 {index} (ref >0.99)
WBC: 7.9 10*3/uL (ref 3.4–10.8)

## 2016-11-09 LAB — HEMOGLOBINOPATHY EVALUATION
HEMOGLOBIN A2 QUANTITATION: 2.7 % (ref 0.7–3.1)
HGB C: 0 %
HGB S: 0 %
Hemoglobin F Quantitation: 0 % (ref 0.0–2.0)
Hgb A: 97.3 % (ref 94.0–98.0)

## 2016-11-09 LAB — VARICELLA ZOSTER ANTIBODY, IGG: VARICELLA: 778 {index} (ref >165)

## 2016-11-09 LAB — CYSTIC FIBROSIS MUTATION 97: Interpretation: NOT DETECTED

## 2016-11-23 NOTE — L&D Delivery Note (Signed)
Delivery Note At 12:45 AM a viable female was delivered via Vaginal, Spontaneous Delivery (Presentation:ROA).  Shoulders delivered with ease; infant immediately placed on maternal abdomen and stimulated to breath.  APGAR: 8, 9; weight pending .   Placenta status: intact; delivered with gentle traction.  Cord:  with the following complications: .  None  Anesthesia:   Episiotomy: None Lacerations: Small tear on the vaginal floor; hemostatic and not-repaired Suture Repair: not repaired Est. Blood Loss (mL):    Mom to postpartum.  Baby to Couplet care / Skin to Skin.  Mervyn Skeeters [place] 04/21/2017, 1:28 AM

## 2016-12-01 ENCOUNTER — Ambulatory Visit (INDEPENDENT_AMBULATORY_CARE_PROVIDER_SITE_OTHER): Payer: Medicaid Other | Admitting: Certified Nurse Midwife

## 2016-12-01 ENCOUNTER — Ambulatory Visit (INDEPENDENT_AMBULATORY_CARE_PROVIDER_SITE_OTHER): Payer: Medicaid Other

## 2016-12-01 ENCOUNTER — Other Ambulatory Visit: Payer: Medicaid Other

## 2016-12-01 VITALS — BP 101/68 | HR 72 | Temp 98.0°F | Wt 210.5 lb

## 2016-12-01 DIAGNOSIS — O99612 Diseases of the digestive system complicating pregnancy, second trimester: Secondary | ICD-10-CM

## 2016-12-01 DIAGNOSIS — O0990 Supervision of high risk pregnancy, unspecified, unspecified trimester: Secondary | ICD-10-CM

## 2016-12-01 DIAGNOSIS — O3412 Maternal care for benign tumor of corpus uteri, second trimester: Secondary | ICD-10-CM

## 2016-12-01 DIAGNOSIS — O09212 Supervision of pregnancy with history of pre-term labor, second trimester: Secondary | ICD-10-CM

## 2016-12-01 DIAGNOSIS — O09219 Supervision of pregnancy with history of pre-term labor, unspecified trimester: Secondary | ICD-10-CM

## 2016-12-01 DIAGNOSIS — D259 Leiomyoma of uterus, unspecified: Secondary | ICD-10-CM

## 2016-12-01 DIAGNOSIS — K219 Gastro-esophageal reflux disease without esophagitis: Secondary | ICD-10-CM

## 2016-12-01 DIAGNOSIS — O09899 Supervision of other high risk pregnancies, unspecified trimester: Secondary | ICD-10-CM

## 2016-12-01 MED ORDER — FAMOTIDINE 10 MG PO CHEW: 10.0000 mg | CHEWABLE_TABLET | Freq: Two times a day (BID) | ORAL | 5 refills | Status: DC

## 2016-12-01 MED ORDER — PRENATE PIXIE 10-0.6-0.4-200 MG PO CAPS: 1.0000 | ORAL_CAPSULE | Freq: Every day | ORAL | 12 refills | Status: DC

## 2016-12-01 NOTE — Progress Notes (Signed)
   PRENATAL VISIT NOTE  Subjective:  Erica Roth is a 36 y.o. G2P0101 at [redacted]w[redacted]d being seen today for ongoing prenatal care.  She is currently monitored for the following issues for this high-risk pregnancy and has Fibroids; Supervision of high risk pregnancy, antepartum; Short interval between pregnancies affecting pregnancy, antepartum; and History of preterm delivery, currently pregnant on her problem list.  Patient reports heartburn, no bleeding, no contractions, no cramping and no leaking.   . Vag. Bleeding: None.  Movement: Present. Denies leaking of fluid.   The following portions of the patient's history were reviewed and updated as appropriate: allergies, current medications, past family history, past medical history, past social history, past surgical history and problem list. Problem list updated.  Objective:   Vitals:   12/01/16 1523  BP: 101/68  Pulse: 72  Temp: 98 F (36.7 C)  Weight: 210 lb 8 oz (95.5 kg)    Fetal Status: Fetal Heart Rate (bpm): 158 Fundal Height: 20 cm Movement: Present     General:  Alert, oriented and cooperative. Patient is in no acute distress.  Skin: Skin is warm and dry. No rash noted.   Cardiovascular: Normal heart rate noted  Respiratory: Normal respiratory effort, no problems with respiration noted  Abdomen: Soft, gravid, appropriate for gestational age. Pain/Pressure: Absent     Pelvic:  Cervical exam deferred        Extremities: Normal range of motion.  Edema: None  Mental Status: Normal mood and affect. Normal behavior. Normal judgment and thought content.   Assessment and Plan:  Pregnancy: G2P0101 at [redacted]w[redacted]d  1. Supervision of high risk pregnancy, antepartum  Had anatomy US today in office.    2. Short interval between pregnancies affecting pregnancy, antepartum      3. History of preterm delivery, currently pregnant     Declines 17-P  4. Uterine leiomyoma, unspecified location     Had [place] in office today  5. Reflux in  pregnancy     Zantac ordered.   Preterm labor symptoms and general obstetric precautions including but not limited to vaginal bleeding, contractions, leaking of fluid and fetal movement were reviewed in detail with the patient. Please refer to After Visit Summary for other counseling recommendations.  Return in about 4 weeks (around 12/29/2016) for ROB.   Morene Crocker, CNM

## 2016-12-02 IMAGING — US US MFM FETAL BPP W/O NON-STRESS
1 series · 15 of 16 positions shown · non-contrast
Comparison: none

[Series 1: us mfm fetal bpp w/o non-stress · 16 acquisitions, 15 frames shown]
[im 1/16]
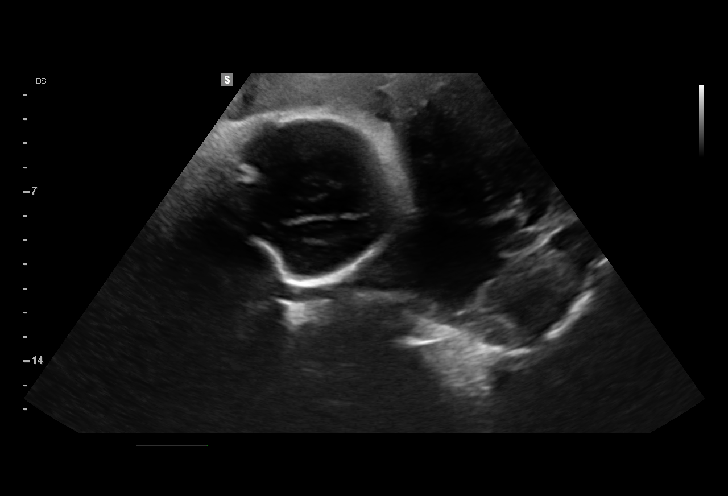
[im 2/16]
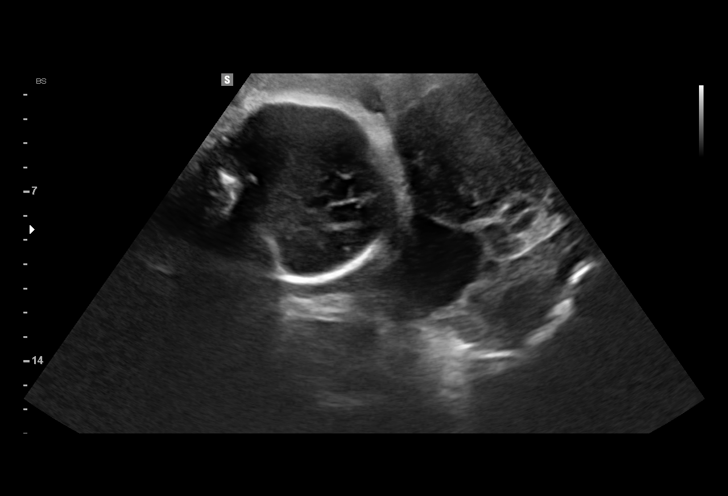
[im 3/16]
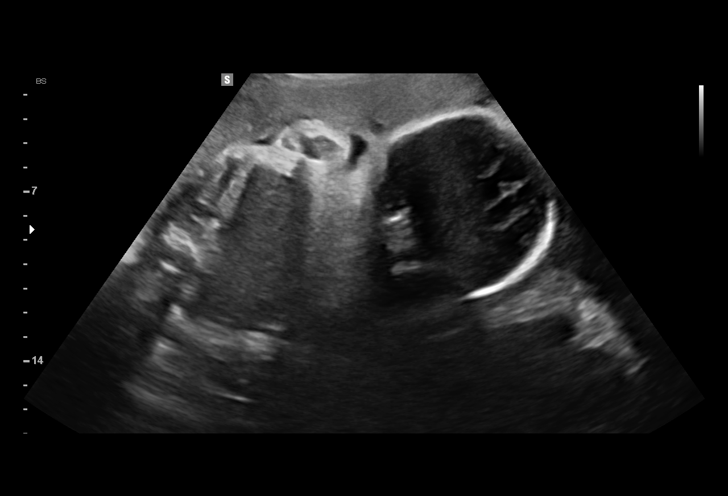
[im 4/16]
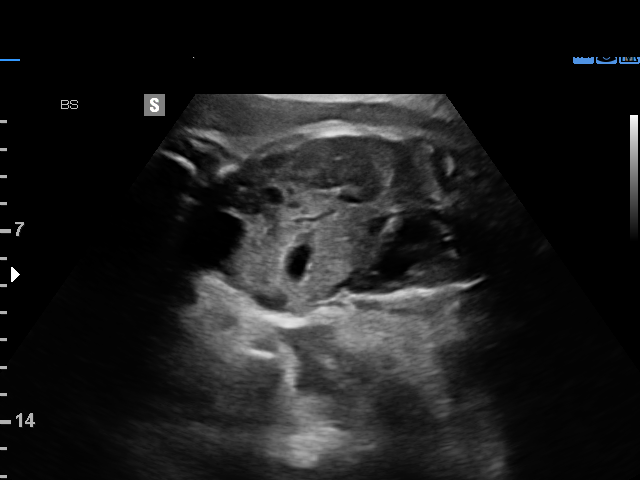
[im 5/16]
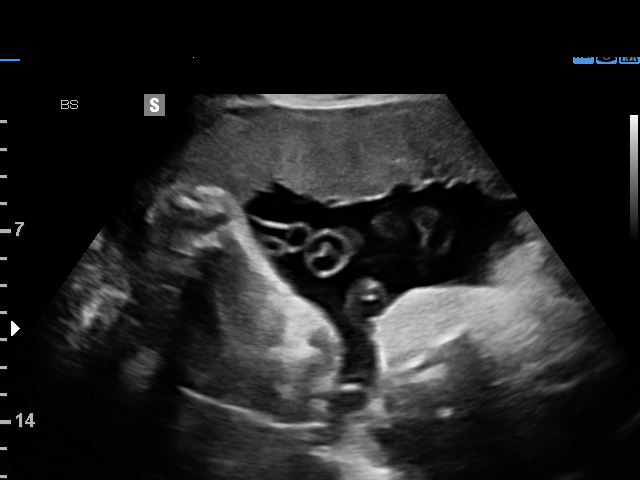
[im 6/16]
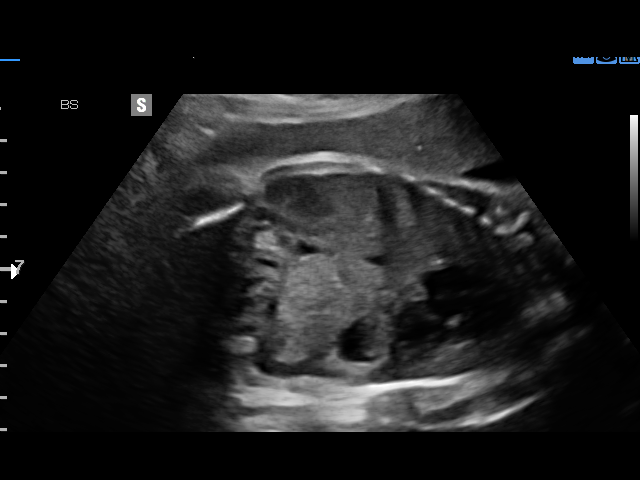
[im 7/16]
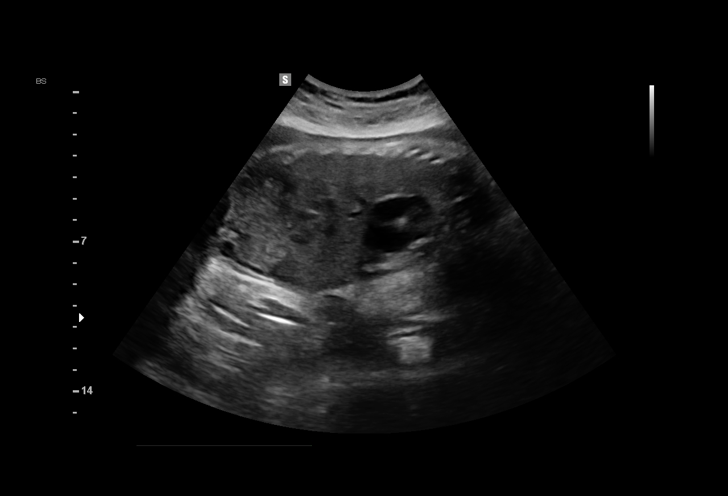
[im 9/16]
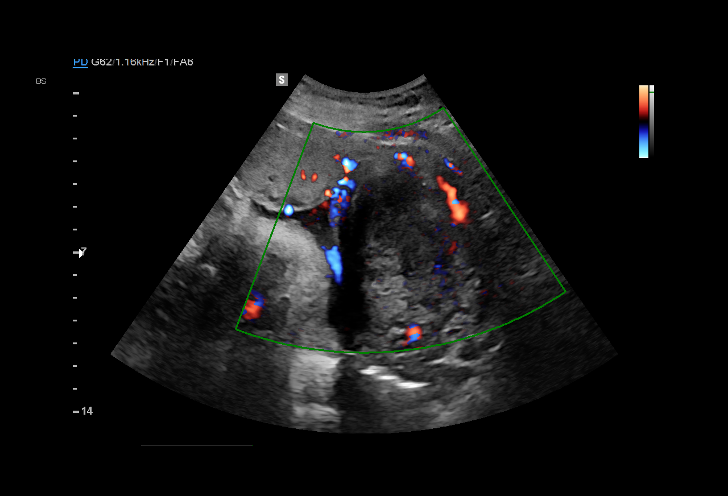
[im 10/16]
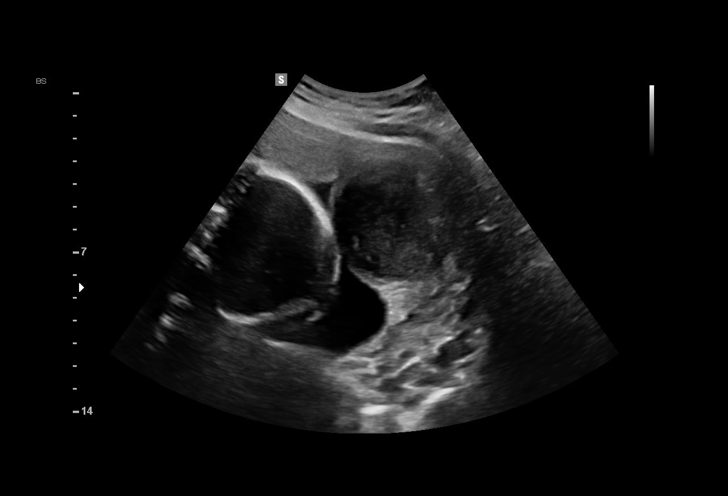
[im 11/16]
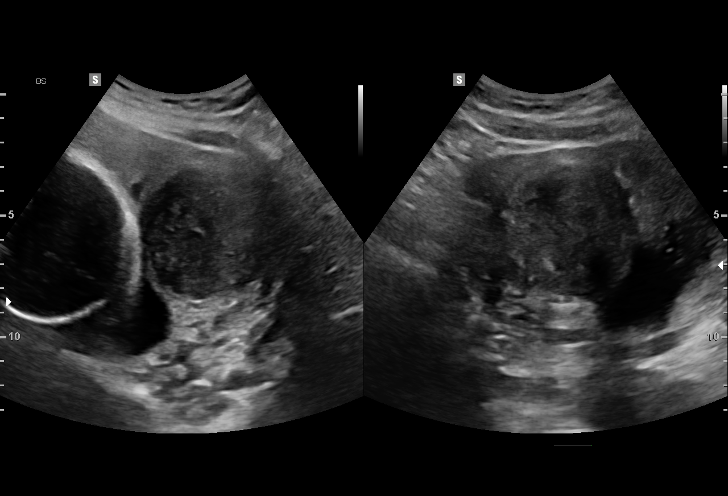
[im 12/16]
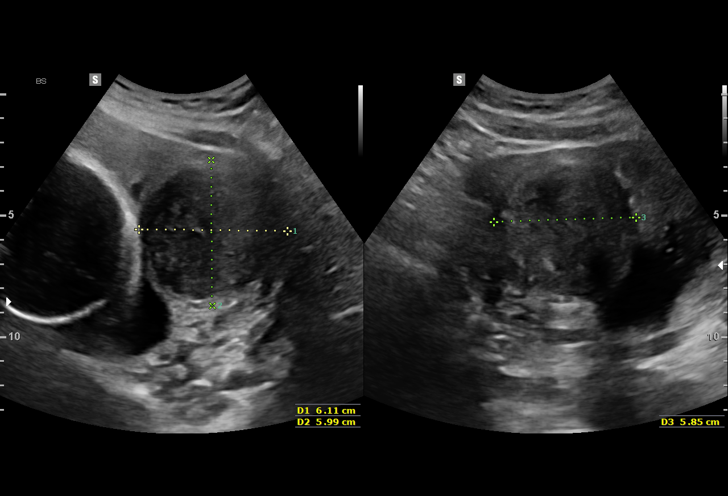
[im 13/16]
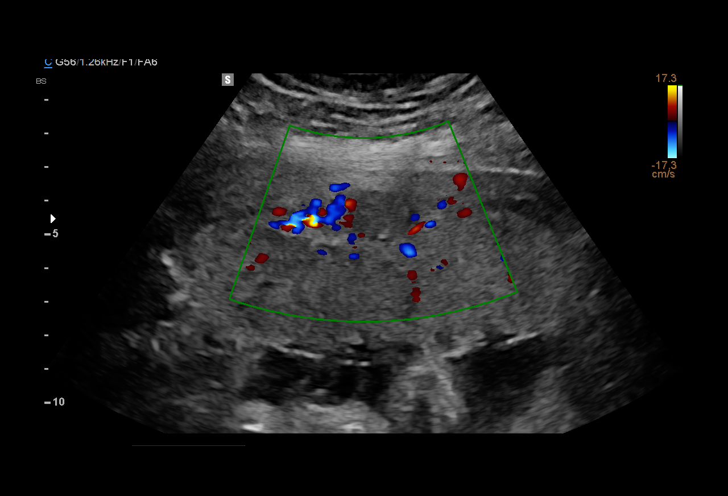
[im 14/16]
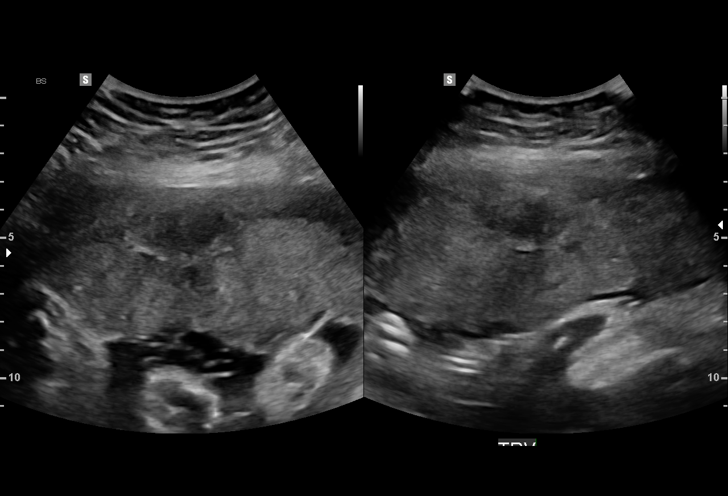
[im 15/16]
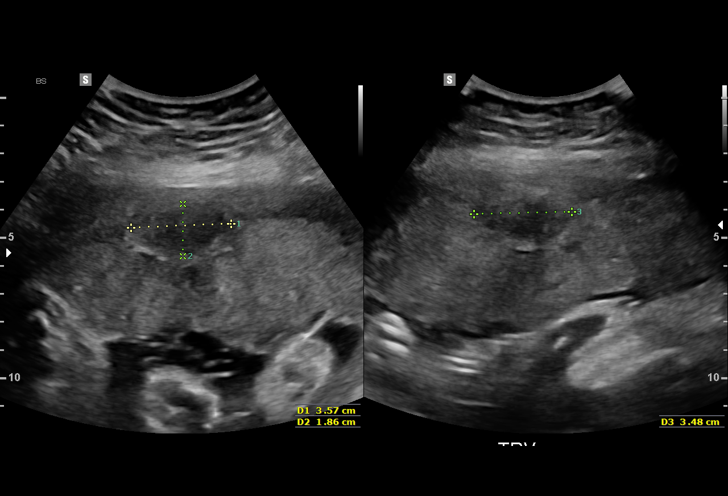
[im 16/16]
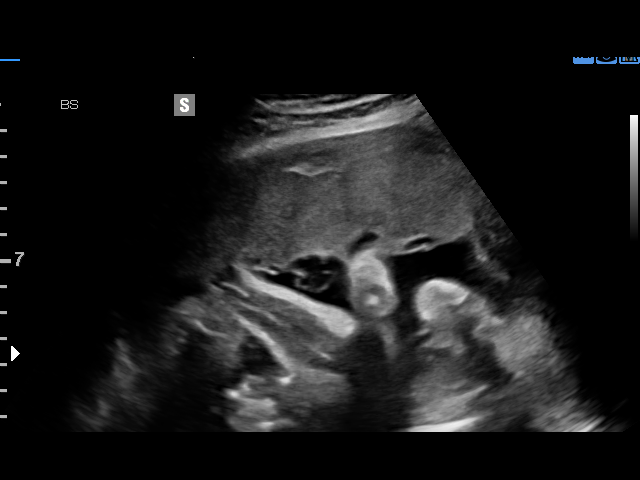

[15 of 16 positions shown; findings below may reference images not displayed]

Obstetrics &
Gynecology
[address]
Antenatal [REDACTED]9

1  DLEKE TIGER             [id number]      [id number]     [id number]
Indications

32 weeks gestation of pregnancy
Premature rupture of membranes 10/17/16 -
leaking fluid
Uterine fibroids affecting pregnancy in third  O34.13,
trimester, antepartum
BMZ [DATE], [DATE], [DATE], [DATE]
Fetal abnormality - other known or
suspected (right lower extremity)
OB History

Gravidity:    1         Term:   0        Prem:   0        SAB:   0
TOP:          0       Ectopic:  0
Fetal Evaluation

Num Of Fetuses:     1
Fetal Heart         149
Rate(bpm):
Cardiac Activity:   Observed
Fetal Lie:          Transverse lie head presenting
Placenta:           Anterior, above cervical os
P. Cord Insertion:  Previously Visualized

Amniotic Fluid
AFI FV:      Subjectively within normal limits
AFI Sum:     10.86   cm      23   %Tile     Larg Pckt:   4.13   cm
RUQ:   4.13   cm    RLQ:    4.09   cm    LUQ:   0.84    cm   LLQ:    1.8    cm
Biophysical Evaluation

Amniotic F.V:   Pocket => 2 cm two         F. Tone:        Observed
planes
F. Movement:    Observed                   Score:          [DATE]
F. Breathing:   Observed
Gestational Age

LMP:           32w 1d       Date:   07/12/15                 EDD:   04/17/16
Best:          32w 1d    Det. By:   LMP  (07/12/15)          EDD:   04/17/16
[place]

Site                     L(cm)      W(cm)      D(cm)      Location
Anterior Rt LUS          6.1        5.9        6
Anterior

Blood Flow                 RI        PI       Comments

Impression

SIUP at 32+1 weeks
Cephalic presentation
Normal amniotic fluid volume
BPP [DATE]

Per report, pt seems to have resealed and increased the AFV
to a normal amount.
Recommendations

Suggest as outpt, antenatal testing and serial growth [place]

## 2016-12-28 ENCOUNTER — Ambulatory Visit (INDEPENDENT_AMBULATORY_CARE_PROVIDER_SITE_OTHER): Payer: Medicaid Other | Admitting: Certified Nurse Midwife

## 2016-12-28 VITALS — BP 107/68 | HR 68 | Wt 215.6 lb

## 2016-12-28 DIAGNOSIS — O09899 Supervision of other high risk pregnancies, unspecified trimester: Secondary | ICD-10-CM

## 2016-12-28 DIAGNOSIS — O09212 Supervision of pregnancy with history of pre-term labor, second trimester: Secondary | ICD-10-CM

## 2016-12-28 DIAGNOSIS — O0990 Supervision of high risk pregnancy, unspecified, unspecified trimester: Secondary | ICD-10-CM

## 2016-12-28 DIAGNOSIS — O09219 Supervision of pregnancy with history of pre-term labor, unspecified trimester: Secondary | ICD-10-CM

## 2016-12-28 NOTE — Progress Notes (Signed)
Patient had questions about [place]. She needs a different reflux medication- Pepcid not working well. Unable to void today.

## 2016-12-28 NOTE — Progress Notes (Signed)
   PRENATAL VISIT NOTE  Subjective:  Erica Roth is a 36 y.o. G2P0101 at [redacted]w[redacted]d being seen today for ongoing prenatal care.  She is currently monitored for the following issues for this high-risk pregnancy and has Fibroids; Supervision of high risk pregnancy, antepartum; Short interval between pregnancies affecting pregnancy, antepartum; and History of preterm delivery, currently pregnant on her problem list.  Patient reports no complaints.  Contractions: Not present. Vag. Bleeding: None.  Movement: Present. Denies leaking of fluid.   The following portions of the patient's history were reviewed and updated as appropriate: allergies, current medications, past family history, past medical history, past social history, past surgical history and problem list. Problem list updated.  Objective:   Vitals:   12/28/16 1409  BP: 107/68  Pulse: 68  Weight: 215 lb 9.6 oz (97.8 kg)    Fetal Status: Fetal Heart Rate (bpm): 150 Fundal Height: 25 cm Movement: Present     General:  Alert, oriented and cooperative. Patient is in no acute distress.  Skin: Skin is warm and dry. No rash noted.   Cardiovascular: Normal heart rate noted  Respiratory: Normal respiratory effort, no problems with respiration noted  Abdomen: Soft, gravid, appropriate for gestational age. Pain/Pressure: Absent     Pelvic:  Cervical exam deferred        Extremities: Normal range of motion.  Edema: None  Mental Status: Normal mood and affect. Normal behavior. Normal judgment and thought content.   Assessment and Plan:  Pregnancy: G2P0101 at [redacted]w[redacted]d  1. Supervision of high risk pregnancy, antepartum     Declines 17-P injections, unable to have previous US read.  Anatomy US ordered with MFM.      Declines any genetic screening.   - [place] MFM OB DETAIL +14 WK; Future  2. History of preterm delivery, currently pregnant     Declined 17-P injections  Preterm labor symptoms and general obstetric precautions including but not  limited to vaginal bleeding, contractions, leaking of fluid and fetal movement were reviewed in detail with the patient. Please refer to After Visit Summary for other counseling recommendations.  Return in about 4 weeks (around 01/25/2017) for 2 hr OGTT, HOB.   Morene Crocker, CNM

## 2016-12-29 ENCOUNTER — Encounter: Payer: Self-pay | Admitting: Obstetrics and Gynecology

## 2016-12-29 DIAGNOSIS — D259 Leiomyoma of uterus, unspecified: Secondary | ICD-10-CM | POA: Insufficient documentation

## 2017-01-04 ENCOUNTER — Encounter (HOSPITAL_COMMUNITY): Payer: Self-pay

## 2017-01-04 ENCOUNTER — Ambulatory Visit (HOSPITAL_COMMUNITY)
Admission: RE | Admit: 2017-01-04 | Discharge: 2017-01-04 | Disposition: A | Discharge status: Home / Self Care | Payer: Medicaid Other | Source: Ambulatory Visit | Attending: Certified Nurse Midwife | Admitting: Certified Nurse Midwife

## 2017-01-04 DIAGNOSIS — O0992 Supervision of high risk pregnancy, unspecified, second trimester: Secondary | ICD-10-CM | POA: Diagnosis present

## 2017-01-04 DIAGNOSIS — D259 Leiomyoma of uterus, unspecified: Secondary | ICD-10-CM | POA: Insufficient documentation

## 2017-01-04 DIAGNOSIS — O09892 Supervision of other high risk pregnancies, second trimester: Secondary | ICD-10-CM | POA: Insufficient documentation

## 2017-01-04 DIAGNOSIS — O09899 Supervision of other high risk pregnancies, unspecified trimester: Secondary | ICD-10-CM

## 2017-01-04 DIAGNOSIS — Z3A24 24 weeks gestation of pregnancy: Secondary | ICD-10-CM | POA: Diagnosis not present

## 2017-01-04 DIAGNOSIS — O09212 Supervision of pregnancy with history of pre-term labor, second trimester: Secondary | ICD-10-CM | POA: Insufficient documentation

## 2017-01-04 DIAGNOSIS — O99212 Obesity complicating pregnancy, second trimester: Secondary | ICD-10-CM | POA: Diagnosis not present

## 2017-01-04 DIAGNOSIS — Z3689 Encounter for other specified antenatal screening: Secondary | ICD-10-CM | POA: Diagnosis not present

## 2017-01-04 DIAGNOSIS — O3412 Maternal care for benign tumor of corpus uteri, second trimester: Secondary | ICD-10-CM | POA: Diagnosis not present

## 2017-01-04 DIAGNOSIS — O0990 Supervision of high risk pregnancy, unspecified, unspecified trimester: Secondary | ICD-10-CM

## 2017-01-04 DIAGNOSIS — O09219 Supervision of pregnancy with history of pre-term labor, unspecified trimester: Secondary | ICD-10-CM

## 2017-01-05 ENCOUNTER — Ambulatory Visit (HOSPITAL_COMMUNITY): Payer: Medicaid Other

## 2017-01-05 ENCOUNTER — Other Ambulatory Visit (HOSPITAL_COMMUNITY): Payer: Self-pay | Admitting: *Deleted

## 2017-01-05 DIAGNOSIS — IMO0002 Reserved for concepts with insufficient information to code with codable children: Secondary | ICD-10-CM

## 2017-01-05 DIAGNOSIS — Z0489 Encounter for examination and observation for other specified reasons: Secondary | ICD-10-CM

## 2017-01-06 ENCOUNTER — Other Ambulatory Visit: Payer: Self-pay | Admitting: Certified Nurse Midwife

## 2017-01-06 DIAGNOSIS — O0990 Supervision of high risk pregnancy, unspecified, unspecified trimester: Secondary | ICD-10-CM

## 2017-01-25 ENCOUNTER — Other Ambulatory Visit: Payer: Medicaid Other

## 2017-01-25 ENCOUNTER — Ambulatory Visit (INDEPENDENT_AMBULATORY_CARE_PROVIDER_SITE_OTHER): Payer: Medicaid Other | Admitting: Certified Nurse Midwife

## 2017-01-25 ENCOUNTER — Encounter (HOSPITAL_COMMUNITY): Payer: Self-pay

## 2017-01-25 ENCOUNTER — Ambulatory Visit (HOSPITAL_COMMUNITY)
Admission: RE | Admit: 2017-01-25 | Discharge: 2017-01-25 | Disposition: A | Discharge status: Home / Self Care | Payer: Medicaid Other | Source: Ambulatory Visit | Attending: Certified Nurse Midwife | Admitting: Certified Nurse Midwife

## 2017-01-25 VITALS — BP 110/76 | HR 83 | Wt 210.2 lb

## 2017-01-25 DIAGNOSIS — D259 Leiomyoma of uterus, unspecified: Secondary | ICD-10-CM | POA: Diagnosis not present

## 2017-01-25 DIAGNOSIS — Z3A27 27 weeks gestation of pregnancy: Secondary | ICD-10-CM | POA: Diagnosis not present

## 2017-01-25 DIAGNOSIS — O3412 Maternal care for benign tumor of corpus uteri, second trimester: Secondary | ICD-10-CM | POA: Insufficient documentation

## 2017-01-25 DIAGNOSIS — O99212 Obesity complicating pregnancy, second trimester: Secondary | ICD-10-CM | POA: Insufficient documentation

## 2017-01-25 DIAGNOSIS — O09219 Supervision of pregnancy with history of pre-term labor, unspecified trimester: Secondary | ICD-10-CM

## 2017-01-25 DIAGNOSIS — IMO0002 Reserved for concepts with insufficient information to code with codable children: Secondary | ICD-10-CM

## 2017-01-25 DIAGNOSIS — B3731 Acute candidiasis of vulva and vagina: Secondary | ICD-10-CM

## 2017-01-25 DIAGNOSIS — O98812 Other maternal infectious and parasitic diseases complicating pregnancy, second trimester: Secondary | ICD-10-CM

## 2017-01-25 DIAGNOSIS — O09899 Supervision of other high risk pregnancies, unspecified trimester: Secondary | ICD-10-CM

## 2017-01-25 DIAGNOSIS — O0990 Supervision of high risk pregnancy, unspecified, unspecified trimester: Secondary | ICD-10-CM

## 2017-01-25 DIAGNOSIS — B373 Candidiasis of vulva and vagina: Secondary | ICD-10-CM

## 2017-01-25 DIAGNOSIS — O09892 Supervision of other high risk pregnancies, second trimester: Secondary | ICD-10-CM | POA: Diagnosis not present

## 2017-01-25 DIAGNOSIS — Z0489 Encounter for examination and observation for other specified reasons: Secondary | ICD-10-CM

## 2017-01-25 DIAGNOSIS — O09212 Supervision of pregnancy with history of pre-term labor, second trimester: Secondary | ICD-10-CM | POA: Diagnosis not present

## 2017-01-25 DIAGNOSIS — Z362 Encounter for other antenatal screening follow-up: Secondary | ICD-10-CM | POA: Diagnosis not present

## 2017-01-25 MED ORDER — TERCONAZOLE 0.8 % VA CREA: 1.0000 | TOPICAL_CREAM | Freq: Every day | VAGINAL | 0 refills | Status: DC

## 2017-01-25 MED ORDER — FLUCONAZOLE 150 MG PO TABS: 150.0000 mg | ORAL_TABLET | Freq: Once | ORAL | 0 refills | Status: AC

## 2017-01-25 NOTE — Progress Notes (Signed)
   PRENATAL VISIT NOTE  Subjective:  Erica Roth is a 36 y.o. G2P0101 at [redacted]w[redacted]d being seen today for ongoing prenatal care.  She is currently monitored for the following issues for this high-risk pregnancy and has Fibroids; Supervision of high risk pregnancy, antepartum; Short interval between pregnancies affecting pregnancy, antepartum; History of preterm delivery, currently pregnant; and Fibroid uterus on her problem list.  Patient reports nausea, no bleeding, no contractions, no cramping, no leaking and vaginal irritation.  Contractions: Not present. Vag. Bleeding: None.  Movement: Present. Denies leaking of fluid.   The following portions of the patient's history were reviewed and updated as appropriate: allergies, current medications, past family history, past medical history, past social history, past surgical history and problem list. Problem list updated.  Objective:   Vitals:   01/25/17 0939  BP: 110/76  Pulse: 83  Weight: 210 lb 3.2 oz (95.3 kg)    Fetal Status: Fetal Heart Rate (bpm): 147 Fundal Height: 27 cm Movement: Present     General:  Alert, oriented and cooperative. Patient is in no acute distress.  Skin: Skin is warm and dry. No rash noted.   Cardiovascular: Normal heart rate noted  Respiratory: Normal respiratory effort, no problems with respiration noted  Abdomen: Soft, gravid, appropriate for gestational age. Pain/Pressure: Absent     Pelvic:  Cervical exam deferred        Extremities: Normal range of motion.  Edema: None  Mental Status: Normal mood and affect. Normal behavior. Normal judgment and thought content.   Assessment and Plan:  Pregnancy: G2P0101 at [redacted]w[redacted]d  1. Supervision of high risk pregnancy, antepartum      Declined 17-P.  F/U US at MFM scheduled for today.  - Glucose Tolerance, 2 Hours w/1 Hour - CBC - HIV antibody - RPR  2. Short interval between pregnancies affecting pregnancy, antepartum      3. History of preterm delivery, currently  pregnant     Declined 17-P  4. Yeast vaginitis     - fluconazole (DIFLUCAN) 150 MG tablet; Take 1 tablet (150 mg total) by mouth once.  Dispense: 1 tablet; Refill: 0 - terconazole (TERAZOL 3) 0.8 % vaginal cream; Place 1 applicator vaginally at bedtime.  Dispense: 20 g; Refill: 0  Preterm labor symptoms and general obstetric precautions including but not limited to vaginal bleeding, contractions, leaking of fluid and fetal movement were reviewed in detail with the patient. Please refer to After Visit Summary for other counseling recommendations.  Return in about 2 weeks (around 02/08/2017) for [place].   Morene Crocker, CNM

## 2017-01-25 NOTE — Progress Notes (Signed)
Patient is having itching- she may have yeast Patient is very nauseous with the 2 hour glucose test.

## 2017-01-26 LAB — HIV ANTIBODY (ROUTINE TESTING W REFLEX): HIV SCREEN 4TH GENERATION: NONREACTIVE

## 2017-01-26 LAB — GLUCOSE TOLERANCE, 2 HOURS W/ 1HR
GLUCOSE, 1 HOUR: 114 mg/dL (ref 65–179)
GLUCOSE, 2 HOUR: 104 mg/dL (ref 65–152)
Glucose, Fasting: 74 mg/dL (ref 65–91)

## 2017-01-26 LAB — CBC
HEMATOCRIT: 34.3 % (ref 34.0–46.6)
HEMOGLOBIN: 11.5 g/dL (ref 11.1–15.9)
MCH: 30.3 pg (ref 26.6–33.0)
MCHC: 33.5 g/dL (ref 31.5–35.7)
MCV: 90 fL (ref 79–97)
Platelets: 231 10*3/uL (ref 150–379)
RBC: 3.8 x10E6/uL (ref 3.77–5.28)
RDW: 13.8 % (ref 12.3–15.4)
WBC: 6.9 10*3/uL (ref 3.4–10.8)

## 2017-01-26 LAB — RPR: RPR: NONREACTIVE

## 2017-01-27 ENCOUNTER — Other Ambulatory Visit: Payer: Self-pay | Admitting: Certified Nurse Midwife

## 2017-01-27 DIAGNOSIS — O0990 Supervision of high risk pregnancy, unspecified, unspecified trimester: Secondary | ICD-10-CM

## 2017-02-08 ENCOUNTER — Encounter: Payer: Self-pay | Admitting: Certified Nurse Midwife

## 2017-02-08 ENCOUNTER — Ambulatory Visit (INDEPENDENT_AMBULATORY_CARE_PROVIDER_SITE_OTHER): Payer: Medicaid Other | Admitting: Certified Nurse Midwife

## 2017-02-08 VITALS — BP 106/66 | HR 85 | Wt 212.0 lb

## 2017-02-08 DIAGNOSIS — O0990 Supervision of high risk pregnancy, unspecified, unspecified trimester: Secondary | ICD-10-CM

## 2017-02-08 DIAGNOSIS — O99613 Diseases of the digestive system complicating pregnancy, third trimester: Secondary | ICD-10-CM

## 2017-02-08 DIAGNOSIS — K219 Gastro-esophageal reflux disease without esophagitis: Secondary | ICD-10-CM

## 2017-02-08 MED ORDER — OMEPRAZOLE 20 MG PO CPDR
20.0000 mg | DELAYED_RELEASE_CAPSULE | Freq: Two times a day (BID) | ORAL | 5 refills | Status: DC
Start: 2017-02-08 — End: 2017-04-22

## 2017-02-08 NOTE — Progress Notes (Signed)
   PRENATAL VISIT NOTE  Subjective:  Erica Roth is a 36 y.o. G2P0101 at [redacted]w[redacted]d being seen today for ongoing prenatal care.  She is currently monitored for the following issues for this low-risk pregnancy and has Fibroids; Supervision of high risk pregnancy, antepartum; Short interval between pregnancies affecting pregnancy, antepartum; History of preterm delivery, currently pregnant; and Fibroid uterus on her problem list.  Patient reports no complaints.  Contractions: Not present. Vag. Bleeding: None.  Movement: Present. Denies leaking of fluid.   The following portions of the patient's history were reviewed and updated as appropriate: allergies, current medications, past family history, past medical history, past social history, past surgical history and problem list. Problem list updated.  Objective:   Vitals:   02/08/17 1458  BP: 106/66  Pulse: 85  Weight: 212 lb (96.2 kg)    Fetal Status: Fetal Heart Rate (bpm): 142 Fundal Height: 29 cm Movement: Present     General:  Alert, oriented and cooperative. Patient is in no acute distress.  Skin: Skin is warm and dry. No rash noted.   Cardiovascular: Normal heart rate noted  Respiratory: Normal respiratory effort, no problems with respiration noted  Abdomen: Soft, gravid, appropriate for gestational age. Pain/Pressure: Absent     Pelvic:  Cervical exam deferred        Extremities: Normal range of motion.  Edema: None  Mental Status: Normal mood and affect. Normal behavior. Normal judgment and thought content.   Assessment and Plan:  Pregnancy: G2P0101 at [redacted]w[redacted]d  1. Supervision of high risk pregnancy, antepartum     Doing well.  17-P injections declined.    2. Gastroesophageal reflux during pregnancy, antepartum, third trimester      Declines to try TUMS.   - omeprazole (PRILOSEC) 20 MG capsule; Take 1 capsule (20 mg total) by mouth 2 (two) times daily before a meal.  Dispense: 60 capsule; Refill: 5  Preterm labor symptoms and  general obstetric precautions including but not limited to vaginal bleeding, contractions, leaking of fluid and fetal movement were reviewed in detail with the patient. Please refer to After Visit Summary for other counseling recommendations.  Return in about 2 weeks (around 02/22/2017) for [facility].   Morene Crocker, CNM

## 2017-02-08 NOTE — Progress Notes (Signed)
Pt presents for ROB c/o heart burn and acid reflux worst at bedtime. No relief with Pepcid.

## 2017-03-01 ENCOUNTER — Ambulatory Visit (INDEPENDENT_AMBULATORY_CARE_PROVIDER_SITE_OTHER): Payer: Medicaid Other | Admitting: Obstetrics and Gynecology

## 2017-03-01 VITALS — BP 116/76 | HR 80 | Wt 215.6 lb

## 2017-03-01 DIAGNOSIS — O09899 Supervision of other high risk pregnancies, unspecified trimester: Secondary | ICD-10-CM

## 2017-03-01 DIAGNOSIS — O0990 Supervision of high risk pregnancy, unspecified, unspecified trimester: Secondary | ICD-10-CM

## 2017-03-01 DIAGNOSIS — O09213 Supervision of pregnancy with history of pre-term labor, third trimester: Secondary | ICD-10-CM

## 2017-03-01 DIAGNOSIS — O09219 Supervision of pregnancy with history of pre-term labor, unspecified trimester: Secondary | ICD-10-CM

## 2017-03-01 NOTE — Progress Notes (Signed)
Patient reports good fetal movement and occassional contractions.

## 2017-03-01 NOTE — Progress Notes (Signed)
   PRENATAL VISIT NOTE  Subjective:  Erica Roth is a 36 y.o. G2P0101 at [redacted]w[redacted]d being seen today for ongoing prenatal care.  She is currently monitored for the following issues for this high-risk pregnancy and has Fibroids; Supervision of high risk pregnancy, antepartum; Short interval between pregnancies affecting pregnancy, antepartum; History of preterm delivery, currently pregnant; and Fibroid uterus on her problem list.  Patient reports no complaints.  Contractions: Irregular. Vag. Bleeding: None.  Movement: Present. Denies leaking of fluid.   The following portions of the patient's history were reviewed and updated as appropriate: allergies, current medications, past family history, past medical history, past social history, past surgical history and problem list. Problem list updated.  Objective:   Vitals:   03/01/17 1530  BP: 116/76  Pulse: 80  Weight: 215 lb 9.6 oz (97.8 kg)    Fetal Status: Fetal Heart Rate (bpm): 140 Fundal Height: 33 cm Movement: Present     General:  Alert, oriented and cooperative. Patient is in no acute distress.  Skin: Skin is warm and dry. No rash noted.   Cardiovascular: Normal heart rate noted  Respiratory: Normal respiratory effort, no problems with respiration noted  Abdomen: Soft, gravid, appropriate for gestational age. Pain/Pressure: Absent     Pelvic:  Cervical exam deferred        Extremities: Normal range of motion.  Edema: None  Mental Status: Normal mood and affect. Normal behavior. Normal judgment and thought content.   Assessment and Plan:  Pregnancy: G2P0101 at [redacted]w[redacted]d  1. Supervision of high risk pregnancy, antepartum Patient is doing well without complaints  2. Short interval between pregnancies affecting pregnancy, antepartum   3. History of preterm delivery, currently pregnant Declined 17-P  Preterm labor symptoms and general obstetric precautions including but not limited to vaginal bleeding, contractions, leaking of fluid  and fetal movement were reviewed in detail with the patient. Please refer to After Visit Summary for other counseling recommendations.  Return in about 2 weeks (around 03/15/2017) for [place].   Mora Bellman, MD

## 2017-03-15 ENCOUNTER — Encounter: Payer: Medicaid Other | Admitting: Obstetrics and Gynecology

## 2017-03-30 ENCOUNTER — Ambulatory Visit (INDEPENDENT_AMBULATORY_CARE_PROVIDER_SITE_OTHER): Payer: Medicaid Other | Admitting: Obstetrics and Gynecology

## 2017-03-30 ENCOUNTER — Other Ambulatory Visit (HOSPITAL_COMMUNITY)
Admission: RE | Admit: 2017-03-30 | Discharge: 2017-03-30 | Disposition: A | Discharge status: Home / Self Care | Payer: Medicaid Other | Source: Ambulatory Visit | Attending: Obstetrics and Gynecology | Admitting: Obstetrics and Gynecology

## 2017-03-30 VITALS — BP 114/71 | HR 91 | Wt 222.0 lb

## 2017-03-30 DIAGNOSIS — Z3493 Encounter for supervision of normal pregnancy, unspecified, third trimester: Secondary | ICD-10-CM | POA: Insufficient documentation

## 2017-03-30 DIAGNOSIS — O09219 Supervision of pregnancy with history of pre-term labor, unspecified trimester: Secondary | ICD-10-CM

## 2017-03-30 DIAGNOSIS — O09899 Supervision of other high risk pregnancies, unspecified trimester: Secondary | ICD-10-CM

## 2017-03-30 DIAGNOSIS — O09893 Supervision of other high risk pregnancies, third trimester: Secondary | ICD-10-CM

## 2017-03-30 DIAGNOSIS — O09213 Supervision of pregnancy with history of pre-term labor, third trimester: Secondary | ICD-10-CM

## 2017-03-30 DIAGNOSIS — O0990 Supervision of high risk pregnancy, unspecified, unspecified trimester: Secondary | ICD-10-CM

## 2017-03-30 LAB — OB RESULTS CONSOLE GC/CHLAMYDIA: GC PROBE AMP, GENITAL: NEGATIVE

## 2017-03-30 NOTE — Progress Notes (Signed)
   PRENATAL VISIT NOTE  Subjective:  Erica Roth is a 36 y.o. G2P0101 at [redacted]w[redacted]d being seen today for ongoing prenatal care.  She is currently monitored for the following issues for this high-risk pregnancy and has Fibroids; Supervision of high risk pregnancy, antepartum; Short interval between pregnancies affecting pregnancy, antepartum; History of preterm delivery, currently pregnant; and Fibroid uterus on her problem list.  Patient reports no complaints.  Contractions: Not present. Vag. Bleeding: None.  Movement: Present. Denies leaking of fluid.   The following portions of the patient's history were reviewed and updated as appropriate: allergies, current medications, past family history, past medical history, past social history, past surgical history and problem list. Problem list updated.  Objective:   Vitals:   03/30/17 1543  BP: 114/71  Pulse: 91  Weight: 222 lb (100.7 kg)    Fetal Status: Fetal Heart Rate (bpm): 150 Fundal Height: 36 cm Movement: Present  Presentation: Vertex  General:  Alert, oriented and cooperative. Patient is in no acute distress.  Skin: Skin is warm and dry. No rash noted.   Cardiovascular: Normal heart rate noted  Respiratory: Normal respiratory effort, no problems with respiration noted  Abdomen: Soft, gravid, appropriate for gestational age. Pain/Pressure: Absent     Pelvic:  Cervical exam performed Dilation: Closed Effacement (%): Thick Station: Ballotable  Extremities: Normal range of motion.  Edema: None  Mental Status: Normal mood and affect. Normal behavior. Normal judgment and thought content.   Assessment and Plan:  Pregnancy: G2P0101 at [redacted]w[redacted]d  1. Prenatal care in third trimester - Strep Gp B NAA - Cervicovaginal ancillary only  2. Supervision of high risk pregnancy, antepartum Patient is doing well without complaints Missed several appointments due to child care Cultures collected today  3. History of preterm delivery, currently  pregnant   4. Short interval between pregnancies affecting pregnancy, antepartum   Term labor symptoms and general obstetric precautions including but not limited to vaginal bleeding, contractions, leaking of fluid and fetal movement were reviewed in detail with the patient. Please refer to After Visit Summary for other counseling recommendations.  Return in about 1 week (around 04/06/2017) for [place].   Magon Croson, Vickii Chafe, MD

## 2017-03-31 LAB — CERVICOVAGINAL ANCILLARY ONLY
Chlamydia: NEGATIVE
Neisseria Gonorrhea: NEGATIVE

## 2017-04-01 LAB — STREP GP B NAA: STREP GROUP B AG: NEGATIVE

## 2017-04-06 ENCOUNTER — Encounter: Payer: Self-pay | Admitting: Obstetrics & Gynecology

## 2017-04-06 ENCOUNTER — Ambulatory Visit (INDEPENDENT_AMBULATORY_CARE_PROVIDER_SITE_OTHER): Payer: Medicaid Other | Admitting: Obstetrics & Gynecology

## 2017-04-06 VITALS — BP 122/76 | HR 76 | Wt 222.6 lb

## 2017-04-06 DIAGNOSIS — O09899 Supervision of other high risk pregnancies, unspecified trimester: Secondary | ICD-10-CM

## 2017-04-06 DIAGNOSIS — O0990 Supervision of high risk pregnancy, unspecified, unspecified trimester: Secondary | ICD-10-CM

## 2017-04-06 DIAGNOSIS — O09219 Supervision of pregnancy with history of pre-term labor, unspecified trimester: Secondary | ICD-10-CM

## 2017-04-06 NOTE — Progress Notes (Signed)
Patient reports she is doing well 

## 2017-04-06 NOTE — Patient Instructions (Signed)

## 2017-04-06 NOTE — Progress Notes (Signed)
   PRENATAL VISIT NOTE  Subjective:  Erica Roth is a 36 y.o. G2P0101 at [redacted]w[redacted]d being seen today for ongoing prenatal care.  She is currently monitored for the following issues for this high-risk pregnancy and has Fibroids; Supervision of high risk pregnancy, antepartum; Short interval between pregnancies affecting pregnancy, antepartum; History of preterm delivery, currently pregnant; and Fibroid uterus on her problem list.  Patient reports no complaints.  Contractions: Irregular. Vag. Bleeding: None.  Movement: Present. Denies leaking of fluid.   The following portions of the patient's history were reviewed and updated as appropriate: allergies, current medications, past family history, past medical history, past social history, past surgical history and problem list. Problem list updated.  Objective:   Vitals:   04/06/17 1029  BP: 122/76  Pulse: 76  Weight: 101 kg (222 lb 9.6 oz)    Fetal Status: Fetal Heart Rate (bpm): 154   Movement: Present     General:  Alert, oriented and cooperative. Patient is in no acute distress.  Skin: Skin is warm and dry. No rash noted.   Cardiovascular: Normal heart rate noted  Respiratory: Normal respiratory effort, no problems with respiration noted  Abdomen: Soft, gravid, appropriate for gestational age. Pain/Pressure: Absent     Pelvic:  Cervical exam deferred        Extremities: Normal range of motion.  Edema: None  Mental Status: Normal mood and affect. Normal behavior. Normal judgment and thought content.   Assessment and Plan:  Pregnancy: G2P0101 at [redacted]w[redacted]d  1. Supervision of high risk pregnancy, antepartum Doing well  2. History of preterm delivery, currently pregnant H/o 34 week delivery and SGA  Term labor symptoms and general obstetric precautions including but not limited to vaginal bleeding, contractions, leaking of fluid and fetal movement were reviewed in detail with the patient. Please refer to After Visit Summary for other  counseling recommendations.  Return in about 1 week (around 04/13/2017).   Woodroe Mode, MD

## 2017-04-13 ENCOUNTER — Encounter: Payer: Medicaid Other | Admitting: Obstetrics & Gynecology

## 2017-04-13 ENCOUNTER — Ambulatory Visit (INDEPENDENT_AMBULATORY_CARE_PROVIDER_SITE_OTHER): Payer: Medicaid Other | Admitting: Certified Nurse Midwife

## 2017-04-13 ENCOUNTER — Encounter: Payer: Self-pay | Admitting: Certified Nurse Midwife

## 2017-04-13 VITALS — BP 120/74 | HR 82 | Wt 221.2 lb

## 2017-04-13 DIAGNOSIS — O09893 Supervision of other high risk pregnancies, third trimester: Secondary | ICD-10-CM

## 2017-04-13 DIAGNOSIS — O0993 Supervision of high risk pregnancy, unspecified, third trimester: Secondary | ICD-10-CM

## 2017-04-13 DIAGNOSIS — O09899 Supervision of other high risk pregnancies, unspecified trimester: Secondary | ICD-10-CM

## 2017-04-13 DIAGNOSIS — O0990 Supervision of high risk pregnancy, unspecified, unspecified trimester: Secondary | ICD-10-CM

## 2017-04-13 NOTE — Progress Notes (Signed)
   PRENATAL VISIT NOTE  Subjective:  Erica Roth is a 36 y.o. G2P0101 at [redacted]w[redacted]d being seen today for ongoing prenatal care.  She is currently monitored for the following issues for this high-risk pregnancy and has Fibroids; Supervision of high risk pregnancy, antepartum; Short interval between pregnancies affecting pregnancy, antepartum; History of preterm delivery, currently pregnant; and Fibroid uterus on her problem list.  Patient reports no complaints.  Contractions: Irritability. Vag. Bleeding: None.  Movement: Present. Denies leaking of fluid.   The following portions of the patient's history were reviewed and updated as appropriate: allergies, current medications, past family history, past medical history, past social history, past surgical history and problem list. Problem list updated.  Objective:   Vitals:   04/13/17 1551  BP: 120/74  Pulse: 82  Weight: 221 lb 3.2 oz (100.3 kg)    Fetal Status: Fetal Heart Rate (bpm): 138 Fundal Height: 35 cm Movement: Present  Presentation: Vertex  General:  Alert, oriented and cooperative. Patient is in no acute distress.  Skin: Skin is warm and dry. No rash noted.   Cardiovascular: Normal heart rate noted  Respiratory: Normal respiratory effort, no problems with respiration noted  Abdomen: Soft, gravid, appropriate for gestational age. Pain/Pressure: Present     Pelvic:  Cervical exam performed Dilation: Closed Effacement (%): 20 Station: -3  Extremities: Normal range of motion.  Edema: Trace  Mental Status: Normal mood and affect. Normal behavior. Normal judgment and thought content.   Assessment and Plan:  Pregnancy: G2P0101 at [redacted]w[redacted]d  1. Supervision of high risk pregnancy, antepartum     Doing well.  Induction form completed.   2. Short interval between pregnancies affecting pregnancy, antepartum      Hx of Preterm delivery, now term without 17-P.   Term labor symptoms and general obstetric precautions including but not limited  to vaginal bleeding, contractions, leaking of fluid and fetal movement were reviewed in detail with the patient. Please refer to After Visit Summary for other counseling recommendations.  Return in about 1 week (around 04/20/2017) for ROB, NST.   Morene Crocker, CNM

## 2017-04-13 NOTE — Progress Notes (Signed)
Patient reports good fetal movement and occasional uterine irritability, denies bleeding.

## 2017-04-13 NOTE — Patient Instructions (Addendum)
Contraception Choices Contraception (birth control) is the use of any methods or devices to prevent pregnancy. Below are some methods to help avoid pregnancy. Hormonal methods  Contraceptive implant. This is a thin, plastic tube containing progesterone hormone. It does not contain estrogen hormone. Your health care provider inserts the tube in the inner part of the upper arm. The tube can remain in place for up to 3 years. After 3 years, the implant must be removed. The implant prevents the ovaries from releasing an egg (ovulation), thickens the cervical mucus to prevent sperm from entering the uterus, and thins the lining of the inside of the uterus.  Progesterone-only injections. These injections are given every 3 months by your health care provider to prevent pregnancy. This synthetic progesterone hormone stops the ovaries from releasing eggs. It also thickens cervical mucus and changes the uterine lining. This makes it harder for sperm to survive in the uterus.  Birth control pills. These pills contain estrogen and progesterone hormone. They work by preventing the ovaries from releasing eggs (ovulation). They also cause the cervical mucus to thicken, preventing the sperm from entering the uterus. Birth control pills are prescribed by a health care provider.Birth control pills can also be used to treat heavy periods.  Minipill. This type of birth control pill contains only the progesterone hormone. They are taken every day of each month and must be prescribed by your health care provider.  Birth control patch. The patch contains hormones similar to those in birth control pills. It must be changed once a week and is prescribed by a health care provider.  Vaginal ring. The ring contains hormones similar to those in birth control pills. It is left in the vagina for 3 weeks, removed for 1 week, and then a new one is put back in place. The patient must be comfortable inserting and removing the ring from  the vagina.A health care provider's prescription is necessary.  Emergency contraception. Emergency contraceptives prevent pregnancy after unprotected sexual intercourse. This pill can be taken right after sex or up to 5 days after unprotected sex. It is most effective the sooner you take the pills after having sexual intercourse. Most emergency contraceptive pills are available without a prescription. Check with your pharmacist. Do not use emergency contraception as your only form of birth control. Barrier methods  Female condom. This is a thin sheath (latex or rubber) that is worn over the penis during sexual intercourse. It can be used with spermicide to increase effectiveness.  Female condom. This is a soft, loose-fitting sheath that is put into the vagina before sexual intercourse.  Diaphragm. This is a soft, latex, dome-shaped barrier that must be fitted by a health care provider. It is inserted into the vagina, along with a spermicidal jelly. It is inserted before intercourse. The diaphragm should be left in the vagina for 6 to 8 hours after intercourse.  Cervical cap. This is a round, soft, latex or plastic cup that fits over the cervix and must be fitted by a health care provider. The cap can be left in place for up to 48 hours after intercourse.  Sponge. This is a soft, circular piece of polyurethane foam. The sponge has spermicide in it. It is inserted into the vagina after wetting it and before sexual intercourse.  Spermicides. These are chemicals that kill or block sperm from entering the cervix and uterus. They come in the form of creams, jellies, suppositories, foam, or tablets. They do not require a prescription. They   are inserted into the vagina with an applicator before having sexual intercourse. The process must be repeated every time you have sexual intercourse. Intrauterine contraception  Intrauterine device (IUD). This is a T-shaped device that is put in a woman's uterus during  a menstrual period to prevent pregnancy. There are 2 types: ? Copper IUD. This type of IUD is wrapped in copper wire and is placed inside the uterus. Copper makes the uterus and fallopian tubes produce a fluid that kills sperm. It can stay in place for 10 years. ? Hormone IUD. This type of IUD contains the hormone progestin (synthetic progesterone). The hormone thickens the cervical mucus and prevents sperm from entering the uterus, and it also thins the uterine lining to prevent implantation of a fertilized egg. The hormone can weaken or kill the sperm that get into the uterus. It can stay in place for 3-5 years, depending on which type of IUD is used. Permanent methods of contraception  Female tubal ligation. This is when the woman's fallopian tubes are surgically sealed, tied, or blocked to prevent the egg from traveling to the uterus.  Hysteroscopic sterilization. This involves placing a small coil or insert into each fallopian tube. Your doctor uses a technique called hysteroscopy to do the procedure. The device causes scar tissue to form. This results in permanent blockage of the fallopian tubes, so the sperm cannot fertilize the egg. It takes about 3 months after the procedure for the tubes to become blocked. You must use another form of birth control for these 3 months.  Female sterilization. This is when the female has the tubes that carry sperm tied off (vasectomy).This blocks sperm from entering the vagina during sexual intercourse. After the procedure, the man can still ejaculate fluid (semen). Natural planning methods  Natural family planning. This is not having sexual intercourse or using a barrier method (condom, diaphragm, cervical cap) on days the woman could become pregnant.  Calendar method. This is keeping track of the length of each menstrual cycle and identifying when you are fertile.  Ovulation method. This is avoiding sexual intercourse during ovulation.  Symptothermal method.  This is avoiding sexual intercourse during ovulation, using a thermometer and ovulation symptoms.  Post-ovulation method. This is timing sexual intercourse after you have ovulated. Regardless of which type or method of contraception you choose, it is important that you use condoms to protect against the transmission of sexually transmitted infections (STIs). Talk with your health care provider about which form of contraception is most appropriate for you. This information is not intended to replace advice given to you by your health care provider. Make sure you discuss any questions you have with your health care provider. Document Released: 11/09/2005 Document Revised: 04/16/2016 Document Reviewed: 05/04/2013 Elsevier Interactive Patient Education  2017 [employer] Inc[employer]  

## 2017-04-20 ENCOUNTER — Encounter (HOSPITAL_COMMUNITY): Payer: Self-pay

## 2017-04-20 ENCOUNTER — Inpatient Hospital Stay (HOSPITAL_COMMUNITY)
Admission: AD | Admit: 2017-04-20 | Discharge: 2017-04-22 | DRG: 775 | Disposition: A | Discharge status: Home / Self Care | Payer: Medicaid Other | Source: Ambulatory Visit | Attending: Family Medicine | Admitting: Family Medicine

## 2017-04-20 ENCOUNTER — Ambulatory Visit (INDEPENDENT_AMBULATORY_CARE_PROVIDER_SITE_OTHER): Payer: Medicaid Other | Admitting: Certified Nurse Midwife

## 2017-04-20 VITALS — BP 131/76 | HR 86 | Wt 226.8 lb

## 2017-04-20 DIAGNOSIS — B373 Candidiasis of vulva and vagina: Secondary | ICD-10-CM

## 2017-04-20 DIAGNOSIS — Z3A40 40 weeks gestation of pregnancy: Secondary | ICD-10-CM

## 2017-04-20 DIAGNOSIS — O98813 Other maternal infectious and parasitic diseases complicating pregnancy, third trimester: Secondary | ICD-10-CM

## 2017-04-20 DIAGNOSIS — B3731 Acute candidiasis of vulva and vagina: Secondary | ICD-10-CM

## 2017-04-20 DIAGNOSIS — O0990 Supervision of high risk pregnancy, unspecified, unspecified trimester: Secondary | ICD-10-CM

## 2017-04-20 DIAGNOSIS — O0993 Supervision of high risk pregnancy, unspecified, third trimester: Secondary | ICD-10-CM

## 2017-04-20 DIAGNOSIS — Z3493 Encounter for supervision of normal pregnancy, unspecified, third trimester: Secondary | ICD-10-CM | POA: Diagnosis present

## 2017-04-20 LAB — CBC
HEMATOCRIT: 39 % (ref 36.0–46.0)
Hemoglobin: 13.2 g/dL (ref 12.0–15.0)
MCH: 30.6 pg (ref 26.0–34.0)
MCHC: 33.8 g/dL (ref 30.0–36.0)
MCV: 90.3 fL (ref 78.0–100.0)
Platelets: 171 10*3/uL (ref 150–400)
RBC: 4.32 MIL/uL (ref 3.87–5.11)
RDW: 14.1 % (ref 11.5–15.5)
WBC: 9.4 10*3/uL (ref 4.0–10.5)

## 2017-04-20 LAB — TYPE AND SCREEN
ABO/RH(D): O POS
Antibody Screen: NEGATIVE

## 2017-04-20 MED ORDER — SOD CITRATE-CITRIC ACID 500-334 MG/5ML PO SOLN: 30.0000 mL | ORAL | Status: DC | PRN

## 2017-04-20 MED ORDER — OXYTOCIN BOLUS FROM INFUSION
500.0000 mL | Freq: Once | INTRAVENOUS | Status: AC
  Administered 2017-04-21: 500 mL via INTRAVENOUS

## 2017-04-20 MED ORDER — OXYCODONE-ACETAMINOPHEN 5-325 MG PO TABS: 2.0000 | ORAL_TABLET | ORAL | Status: DC | PRN

## 2017-04-20 MED ORDER — OXYTOCIN 40 UNITS IN LACTATED RINGERS INFUSION - SIMPLE MED
2.5000 [IU]/h | INTRAVENOUS | Status: DC
  Administered 2017-04-21: 2.5 [IU]/h via INTRAVENOUS
  Filled 2017-04-20: qty 1000

## 2017-04-20 MED ORDER — ONDANSETRON HCL 4 MG/2ML IJ SOLN: 4.0000 mg | Freq: Four times a day (QID) | INTRAMUSCULAR | Status: DC | PRN

## 2017-04-20 MED ORDER — ACETAMINOPHEN 325 MG PO TABS: 650.0000 mg | ORAL_TABLET | ORAL | Status: DC | PRN

## 2017-04-20 MED ORDER — FENTANYL CITRATE (PF) 100 MCG/2ML IJ SOLN
100.0000 ug | INTRAMUSCULAR | Status: DC | PRN
  Administered 2017-04-20: 100 ug via INTRAVENOUS
  Filled 2017-04-20: qty 2

## 2017-04-20 MED ORDER — TERCONAZOLE 0.8 % VA CREA: 1.0000 | TOPICAL_CREAM | Freq: Every day | VAGINAL | 0 refills | Status: DC

## 2017-04-20 MED ORDER — LIDOCAINE HCL (PF) 1 % IJ SOLN
30.0000 mL | INTRAMUSCULAR | Status: DC | PRN
  Administered 2017-04-21: 30 mL via SUBCUTANEOUS
  Filled 2017-04-20: qty 30

## 2017-04-20 MED ORDER — FLUCONAZOLE 150 MG PO TABS: 150.0000 mg | ORAL_TABLET | Freq: Once | ORAL | 0 refills | Status: DC

## 2017-04-20 MED ORDER — FLEET ENEMA 7-19 GM/118ML RE ENEM: 1.0000 | ENEMA | Freq: Every day | RECTAL | Status: DC | PRN

## 2017-04-20 MED ORDER — LACTATED RINGERS IV SOLN
INTRAVENOUS | Status: DC
  Administered 2017-04-20: 22:00:00 via INTRAVENOUS

## 2017-04-20 MED ORDER — OXYCODONE-ACETAMINOPHEN 5-325 MG PO TABS: 1.0000 | ORAL_TABLET | ORAL | Status: DC | PRN

## 2017-04-20 MED ORDER — LACTATED RINGERS IV SOLN: 500.0000 mL | INTRAVENOUS | Status: DC | PRN

## 2017-04-20 NOTE — Progress Notes (Signed)
Patient complains of having discharge with blood since yesterday (x2 d), she is also having contractions.

## 2017-04-20 NOTE — MAU Note (Signed)
Patient presents with contractions.  No LOF.  Reports good FM.  Last checked today in the office and was closed.

## 2017-04-20 NOTE — H&P (Signed)
Erica Roth is a 36 y.o. female G45P0101 with IUP at [redacted]w[redacted]d presenting for SOL.  Pt states she has been having irregular  Contractions with no  vaginal bleeding since this afternoon.  Membranes are intact, with active fetal movement.   [facility] at  [facility] 15 wks.   Prenatal History/Complications: None Past Medical History: Past Medical History:  Diagnosis Date  . Fibroid   . Preterm labor     Past Surgical History: Past Surgical History:  Procedure Laterality Date  . NO PAST SURGERIES      Obstetrical History: OB History    Gravida Para Term Preterm AB Living   2 1   1   1    SAB TAB Ectopic Multiple Live Births         0 1       Social History: Social History   Social History  . Marital status: Married    Spouse name: N/A  . Number of children: N/A  . Years of education: N/A   Social History Main Topics  . Smoking status: Never Smoker  . Smokeless tobacco: Never Used  . Alcohol use No  . Drug use: No  . Sexual activity: No   Other Topics Concern  . None   Social History Narrative  . None    Family History: Family History  Problem Relation Age of Onset  . Hypertension Mother     Allergies: No Known Allergies  Prescriptions Prior to Admission  Medication Sig Dispense Refill Last Dose  . famotidine (PEPCID AC) 10 MG chewable tablet Chew 1 tablet (10 mg total) by mouth 2 (two) times daily. 60 tablet 5 Taking  . fluconazole (DIFLUCAN) 150 MG tablet Take 1 tablet (150 mg total) by mouth once. 1 tablet 0   . omeprazole (PRILOSEC) 20 MG capsule Take 1 capsule (20 mg total) by mouth 2 (two) times daily before a meal. (Patient not taking: Reported on 04/13/2017) 60 capsule 5 Not Taking  . Prenat-FeAsp-Meth-FA-DHA w/o A (PRENATE PIXIE) 10-0.6-0.4-200 MG CAPS Take 1 tablet by mouth daily. 30 capsule 12 Taking  . Prenatal Vit-Fe Fumarate-FA (PRENATAL MULTIVITAMIN) TABS tablet Take 1 tablet by mouth daily at 12 noon.   Not Taking  . terconazole (TERAZOL 3) 0.8 %  vaginal cream Place 1 applicator vaginally at bedtime. 20 g 0         Review of Systems   Constitutional: Negative for fever and chills Eyes: Negative for visual disturbances Respiratory: Negative for shortness of breath, dyspnea Cardiovascular: Negative for chest pain or palpitations  Gastrointestinal: Negative for vomiting, diarrhea and constipation.  POSITIVE for abdominal pain (contractions) Genitourinary: Negative for dysuria and urgency Musculoskeletal: Negative for back pain, joint pain, myalgias  Neurological: Negative for dizziness and headaches      Blood pressure 127/72, pulse 82, temperature 97.6 F (36.4 C), temperature source Oral, resp. rate 20, last menstrual period 07/14/2016, unknown if currently breastfeeding. General appearance: alert, cooperative and no distress Lungs: clear to auscultation bilaterally Heart: regular rate and rhythm Abdomen: soft, non-tender; bowel sounds normal Extremities: Homans sign is negative, no sign of DVT DTR's 2+ Presentation: cephalic Fetal monitoring  Baseline: 125 bpm Uterine activity  Date/time of onset: 5/29 in the afternoon Dilation: 4.5 Effacement (%): 90 Station: -1 Exam by:: Wendall Stade, RNC   Prenatal labs: ABO, Rh: --/--/O POS (05/29 2133) Antibody: NEG (05/29 2133) Rubella: Immune RPR: Non Reactive (03/05 1030)  HBsAg: Negative (12/11 1626)  HIV: Non Reactive (03/05 1030)  GBS: Negative (  05/08 1600)  1 hr Glucola  Two hour normal Genetic screening   Anatomy US normal   Prenatal Transfer Tool  Maternal Diabetes: No Genetic Screening: Normal Maternal Ultrasounds/Referrals: Normal Fetal Ultrasounds or other Referrals:  None Maternal Substance Abuse:  No Significant Maternal Medications:  None Significant Maternal Lab Results: None     Results for orders placed or performed during the hospital encounter of 04/20/17 (from the past 24 hour(s))  CBC   Collection Time: 04/20/17  9:30 PM  Result  Value Ref Range   WBC 9.4 4.0 - 10.5 K/uL   RBC 4.32 3.87 - 5.11 MIL/uL   Hemoglobin 13.2 12.0 - 15.0 g/dL   HCT 39.0 36.0 - 46.0 %   MCV 90.3 78.0 - 100.0 fL   MCH 30.6 26.0 - 34.0 pg   MCHC 33.8 30.0 - 36.0 g/dL   RDW 14.1 11.5 - 15.5 %   Platelets 171 150 - 400 K/uL  Type and screen [facility]   Collection Time: 04/20/17  9:33 PM  Result Value Ref Range   ABO/RH(D) O POS    Antibody Screen NEG    Sample Expiration 04/23/2017     Assessment: Erica Roth is a 36 y.o. G2P0101 with an IUP at [redacted]w[redacted]d presenting for SOL.   Plan: #Labor: expectant management #Pain:  Per request #FWB Cat 1 #ID: GBS: negative #MOF:  breast #MOC: unsure #Circ: female   Starr [place] 04/20/2017, 10:28 PM

## 2017-04-20 NOTE — Progress Notes (Addendum)
   PRENATAL VISIT NOTE  Subjective:  Erica Roth is a 36 y.o. G2P0101 at [redacted]w[redacted]d being seen today for ongoing prenatal care.  She is currently monitored for the following issues for this high-risk pregnancy and has Fibroids; Supervision of high risk pregnancy, antepartum; Short interval between pregnancies affecting pregnancy, antepartum; History of preterm delivery, currently pregnant; and Fibroid uterus on her problem list.  Patient reports no bleeding, no leaking, occasional contractions and vaginal irritation.  Contractions: Irregular. Vag. Bleeding: Scant.  Movement: Present. Denies leaking of fluid.   The following portions of the patient's history were reviewed and updated as appropriate: allergies, current medications, past family history, past medical history, past social history, past surgical history and problem list. Problem list updated.  Objective:   Vitals:   04/20/17 1334  BP: 131/76  Pulse: 86  Weight: 226 lb 12.8 oz (102.9 kg)    Fetal Status: Fetal Heart Rate (bpm): NST;reactive Fundal Height: 36 cm Movement: Present  Presentation: Vertex  General:  Alert, oriented and cooperative. Patient is in no acute distress.  Skin: Skin is warm and dry. No rash noted.   Cardiovascular: Normal heart rate noted  Respiratory: Normal respiratory effort, no problems with respiration noted  Abdomen: Soft, gravid, appropriate for gestational age. Pain/Pressure: Present     Pelvic:  Cervical exam performed Dilation: Closed Effacement (%): 20 Station: -3  Extremities: Normal range of motion.  Edema: Mild pitting, slight indentation  Mental Status: Normal mood and affect. Normal behavior. Normal judgment and thought content.  NST: + accels, no decels, moderate variability, Cat. 1 tracing. No contractions on toco.   Assessment and Plan:  Pregnancy: G2P0101 at [redacted]w[redacted]d  1. Supervision of high risk pregnancy, antepartum     Doing well.  Reactive NST.  IOL scheduled for 04/27/17  @0730 .  Term labor symptoms and general obstetric precautions including but not limited to vaginal bleeding, contractions, leaking of fluid and fetal movement were reviewed in detail with the patient. Please refer to After Visit Summary for other counseling recommendations.  Return in about 4 weeks (around 05/18/2017) for Postpartum.   Morene Crocker, CNM

## 2017-04-21 ENCOUNTER — Encounter (HOSPITAL_COMMUNITY): Payer: Self-pay

## 2017-04-21 DIAGNOSIS — Z3A40 40 weeks gestation of pregnancy: Secondary | ICD-10-CM

## 2017-04-21 LAB — RPR: RPR Ser Ql: NONREACTIVE

## 2017-04-21 MED ORDER — SIMETHICONE 80 MG PO CHEW: 80.0000 mg | CHEWABLE_TABLET | ORAL | Status: DC | PRN

## 2017-04-21 MED ORDER — ZOLPIDEM TARTRATE 5 MG PO TABS: 5.0000 mg | ORAL_TABLET | Freq: Every evening | ORAL | Status: DC | PRN

## 2017-04-21 MED ORDER — TETANUS-DIPHTH-ACELL PERTUSSIS 5-2.5-18.5 LF-MCG/0.5 IM SUSP: 0.5000 mL | Freq: Once | INTRAMUSCULAR | Status: DC

## 2017-04-21 MED ORDER — DIPHENHYDRAMINE HCL 25 MG PO CAPS: 25.0000 mg | ORAL_CAPSULE | Freq: Four times a day (QID) | ORAL | Status: DC | PRN

## 2017-04-21 MED ORDER — ACETAMINOPHEN 325 MG PO TABS: 650.0000 mg | ORAL_TABLET | ORAL | Status: DC | PRN

## 2017-04-21 MED ORDER — PRENATAL MULTIVITAMIN CH
1.0000 | ORAL_TABLET | Freq: Every day | ORAL | Status: DC
  Administered 2017-04-21 – 2017-04-22 (×2): 1 via ORAL
  Filled 2017-04-21 (×2): qty 1

## 2017-04-21 MED ORDER — IBUPROFEN 600 MG PO TABS
600.0000 mg | ORAL_TABLET | Freq: Four times a day (QID) | ORAL | Status: DC
  Administered 2017-04-21 – 2017-04-22 (×5): 600 mg via ORAL
  Filled 2017-04-21 (×6): qty 1

## 2017-04-21 MED ORDER — WITCH HAZEL-GLYCERIN EX PADS: 1.0000 "application " | MEDICATED_PAD | CUTANEOUS | Status: DC | PRN

## 2017-04-21 MED ORDER — ONDANSETRON HCL 4 MG PO TABS: 4.0000 mg | ORAL_TABLET | ORAL | Status: DC | PRN

## 2017-04-21 MED ORDER — DIBUCAINE 1 % RE OINT: 1.0000 "application " | TOPICAL_OINTMENT | RECTAL | Status: DC | PRN

## 2017-04-21 MED ORDER — COCONUT OIL OIL: 1.0000 "application " | TOPICAL_OIL | Status: DC | PRN

## 2017-04-21 MED ORDER — ONDANSETRON HCL 4 MG/2ML IJ SOLN
4.0000 mg | INTRAMUSCULAR | Status: DC | PRN
Start: 2017-04-21 — End: 2017-04-22

## 2017-04-21 MED ORDER — SENNOSIDES-DOCUSATE SODIUM 8.6-50 MG PO TABS
2.0000 | ORAL_TABLET | ORAL | Status: DC
  Administered 2017-04-22: 2 via ORAL

## 2017-04-21 MED ORDER — BENZOCAINE-MENTHOL 20-0.5 % EX AERO: 1.0000 "application " | INHALATION_SPRAY | CUTANEOUS | Status: DC | PRN

## 2017-04-21 NOTE — Progress Notes (Signed)
UR chart review completed.  

## 2017-04-21 NOTE — Lactation Note (Signed)
This note was copied from a baby's chart. Lactation Consultation Note  Patient Name: Girl Vaunda Gutterman [id number] Date: 04/21/2017 Reason for consult: Follow-up assessment  LC called to room by MBU RN to assist mom with c/o of baby cluster feeding and mom wanting formula.  Mom reports she doesn't want to give formula, but baby is fussy.  Mom has baby swaddled with shallow latch.  LC encouraged mom to allow baby STS for baby's comfort.  Baby undressed and then sleepy at the breast, but fussy.  LC allowed baby to suck on gloved finger with poor tongue extension and tongue snap back.  LC instructed parents on suck training.   LC assisted with hand expression and spoon feeding of about 32mls.  Baby more content and LC encouraged mom to change voided diaper and then feed baby on demand. LC advised mom she may need a NS for latching and then would require some pumping.  LC to report to night LC for follow up as needed.     Maternal Data    Feeding Feeding Type: Breast Fed Length of feed: 40 min  LATCH Score/Interventions Latch: Repeated attempts needed to sustain latch, nipple held in mouth throughout feeding, stimulation needed to elicit sucking reflex. Intervention(s): Adjust position;Assist with latch;Breast massage;Breast compression  Audible Swallowing: A few with stimulation Intervention(s): Skin to skin;Hand expression;Alternate breast massage  Type of Nipple: Everted at rest and after stimulation  Comfort (Breast/Nipple): Soft / non-tender     Hold (Positioning): Assistance needed to correctly position infant at breast and maintain latch. Intervention(s): Breastfeeding basics reviewed;Support Pillows;Position options;Skin to skin  LATCH Score: 7  Lactation Tools Discussed/Used     Consult Status Consult Status: Follow-up Follow-up type: In-patient    Justice Britain 04/21/2017, 11:28 PM

## 2017-04-21 NOTE — Lactation Note (Signed)
This note was copied from a baby's chart. Lactation Consultation Note  Patient Name: Erica Roth [id number] Date: 04/21/2017 Reason for consult: Initial assessment Baby at 10 hr of life. Mom reports baby is latching well. She denies breast or nipple pain. She is worried about supply. She reports having to offer formula for the 1st month of her older child's life because her milk was "too watery". Discussed baby behavior, feeding frequency, pumping, supplementing, baby belly size, voids, wt loss, breast changes, and nipple care. Mom stated she can manually express and has spoon at bedside. Given lactation handouts. Aware of OP services and support group. She will offer the breast on demand 8+/24hr, post express as needed, and offer supplement as needed per volume guidelines.    Maternal Data Has patient been taught Hand Expression?: Yes Does the patient have breastfeeding experience prior to this delivery?: Yes  Feeding Feeding Type: Breast Fed Length of feed: 30 min  LATCH Score/Interventions                      Lactation Tools Discussed/Used WIC Program: No   Consult Status Consult Status: Follow-up Date: 04/22/17 Follow-up type: In-patient    Denzil Hughes 04/21/2017, 11:29 AM

## 2017-04-22 ENCOUNTER — Ambulatory Visit: Payer: Self-pay

## 2017-04-22 MED ORDER — IBUPROFEN 600 MG PO TABS: 600.0000 mg | ORAL_TABLET | Freq: Four times a day (QID) | ORAL | 2 refills | Status: DC

## 2017-04-22 NOTE — Lactation Note (Signed)
This note was copied from a baby's chart. Lactation Consultation Note  Patient Name: Erica Roth Date: 04/22/2017 Reason for consult: Follow-up assessment  Visited with Mom, baby 73 hrs old.  Mom discharged and baby staying to work on milk supply.  Baby has been exclusively breastfeeding and output good.   Baby fussing, so offered to assist with positioning and latch.  Mom had baby dressed and wrapped in blanket.  Positioned baby to latch in cradle hold.  Assisted with STS, and using cross cradle.  Mom hand expressed colostrum easily.  Baby latched easily with a wide, areolar latch.  Demonstrated how to use alternate breast compression to increase milk transfer.  Swallows identified. Basics reviewed.   Encouraged continued STS, and feeding often on cue.   Mom to call for assistance as needed.  LC to follow up in am.   Consult Status Consult Status: Follow-up Date: 04/23/17 Follow-up type: In-patient    Broadus John 04/22/2017, 2:43 PM

## 2017-04-22 NOTE — Discharge Instructions (Signed)

## 2017-04-22 NOTE — Discharge Summary (Signed)
OB Discharge Summary     Patient Name: Erica Roth DOB: [birth year] MRN: [id number]  Date of admission: 04/20/2017 Delivering MD: Starr Lake   Date of discharge: 04/22/2017  Admitting diagnosis: 61 WKS, CTXS Intrauterine pregnancy: [redacted]w[redacted]d     Secondary diagnosis:  Active Problems:   Normal labor  Additional problems: none     Discharge diagnosis: Term Pregnancy Delivered                                                                                                Post partum procedures:none  Augmentation: none  Complications: None  Hospital course:  Onset of Labor With Vaginal Delivery     36 y.o. yo [id number] at [redacted]w[redacted]d was admitted in Active Labor on 04/20/2017. Patient had an uncomplicated labor course as follows:  Membrane Rupture Time/Date: 12:08 AM ,04/21/2017   Intrapartum Procedures: Episiotomy: None [1]                                         Lacerations:  1st degree [2]  Patient had a delivery of a Viable infant. 04/21/2017  Information for the patient's newborn:  Maiana, Hennigan [[id number]]  Delivery Method: Vag-Spont    Pateint had an uncomplicated postpartum course.  She is ambulating, tolerating a regular diet, passing flatus, and urinating well. Patient is discharged home in stable condition on 04/22/17.   Physical exam  Vitals:   04/21/17 0807 04/21/17 1626 04/21/17 1803 04/22/17 0628  BP: 133/72 132/73 119/69 125/71  Pulse: 74 75 65 65  Resp: 18 20 20 18   Temp: 98.7 F (37.1 C) 98.2 F (36.8 C) 98.7 F (37.1 C) 97.8 F (36.6 C)  TempSrc: Axillary Oral Oral Oral  SpO2: 99%   100%  Weight:      Height:       General: alert, cooperative and no distress Lochia: appropriate Uterine Fundus: firm Incision: N/A DVT Evaluation: No evidence of DVT seen on physical exam. No cords or calf tenderness. No significant calf/ankle edema. Labs: Lab Results  Component Value Date   WBC 9.4 04/20/2017   HGB 13.2 04/20/2017   HCT 39.0  04/20/2017   MCV 90.3 04/20/2017   PLT 171 04/20/2017   CMP Latest Ref Rng & Units 01/20/2016  Glucose mg/dL 90  BUN 6 - 20 mg/dL -  Creatinine 0.44 - 1.00 mg/dL -  Sodium 135 - 145 mmol/L -  Potassium 3.5 - 5.1 mmol/L -  Chloride 101 - 111 mmol/L -  CO2 22 - 32 mmol/L -  Calcium 8.9 - 10.3 mg/dL -  Total Protein 6.5 - 8.1 g/dL -  Total Bilirubin 0.3 - 1.2 mg/dL -  Alkaline Phos 38 - 126 U/L -  AST 15 - 41 U/L -  ALT 14 - 54 U/L -    Discharge instruction: per After Visit Summary and "Baby and Me Booklet".  After visit meds:  Allergies as of 04/22/2017   No Known Allergies     Medication List  STOP taking these medications   famotidine 10 MG chewable tablet Commonly known as:  PEPCID AC   fluconazole 150 MG tablet Commonly known as:  DIFLUCAN   omeprazole 20 MG capsule Commonly known as:  PRILOSEC     TAKE these medications   ibuprofen 600 MG tablet Commonly known as:  ADVIL,MOTRIN Take 1 tablet (600 mg total) by mouth every 6 (six) hours.   PRENATE PIXIE 10-0.6-0.4-200 MG Caps Take 1 tablet by mouth daily.   terconazole 0.8 % vaginal cream Commonly known as:  TERAZOL 3 Place 1 applicator vaginally at bedtime.       Diet: routine diet  Activity: Advance as tolerated. Pelvic rest for 6 weeks.   Outpatient follow up:5 weeks Follow up Appt:No future appointments. Follow up Visit:No Follow-up on file.  Postpartum contraception: Undecided  Newborn Data: Live born female  Birth Weight: 6 lb 10.4 oz (3016 g) APGAR: 8, 9  Baby Feeding: Breast Disposition:home with mother   04/22/2017 Morene Crocker, CNM

## 2017-04-22 NOTE — Progress Notes (Signed)
Post Partum Day #1 Subjective: no complaints, up ad lib, voiding and tolerating PO  Objective: Blood pressure 125/71, pulse 65, temperature 97.8 F (36.6 C), temperature source Oral, resp. rate 18, height 5\' 9"  (1.753 m), weight 221 lb (100.2 kg), last menstrual period 07/14/2016, SpO2 100 %, unknown if currently breastfeeding.  Physical Exam:  General: alert, cooperative and no distress Lochia: appropriate Uterine Fundus: firm Incision: none DVT Evaluation: No evidence of DVT seen on physical exam. No cords or calf tenderness. No significant calf/ankle edema.   Recent Labs  04/20/17 2130  HGB 13.2  HCT 39.0    Assessment/Plan: Discharge home, Breastfeeding and Contraception undecided   LOS: 2 days   Morene Crocker, CNM 04/22/2017, 7:23 AM

## 2017-04-23 ENCOUNTER — Ambulatory Visit: Payer: Self-pay

## 2017-04-23 NOTE — Lactation Note (Signed)
This note was copied from a baby's chart. Lactation Consultation Note  Patient Name: Girl Tifanie Gardiner [id number] Date: 04/23/2017  Mom states breastfeeding is going very well.  Instructed to continue feeding with cues.  Questions answered.  Lactation outpatient services and support information reviewed and encouraged.   Maternal Data    Feeding    LATCH Score/Interventions                      Lactation Tools Discussed/Used     Consult Status      Ave Filter 04/23/2017, 9:18 AM

## 2017-04-27 ENCOUNTER — Inpatient Hospital Stay (HOSPITAL_COMMUNITY): Admission: RE | Admit: 2017-04-27 | Payer: Medicaid Other | Source: Ambulatory Visit

## 2017-04-27 ENCOUNTER — Inpatient Hospital Stay (HOSPITAL_COMMUNITY): Payer: Medicaid Other

## 2017-05-18 ENCOUNTER — Encounter: Payer: Medicaid Other | Admitting: Obstetrics and Gynecology

## 2017-06-24 ENCOUNTER — Ambulatory Visit: Payer: Medicaid Other | Admitting: Obstetrics and Gynecology

## 2018-10-24 ENCOUNTER — Encounter (HOSPITAL_COMMUNITY): Payer: Self-pay | Admitting: *Deleted

## 2018-10-24 ENCOUNTER — Other Ambulatory Visit: Payer: Self-pay

## 2018-10-24 ENCOUNTER — Inpatient Hospital Stay (HOSPITAL_COMMUNITY)
Admission: AD | Admit: 2018-10-24 | Discharge: 2018-10-24 | Disposition: A | Discharge status: Home / Self Care | Payer: Medicaid Other | Source: Ambulatory Visit | Attending: Obstetrics and Gynecology | Admitting: Obstetrics and Gynecology

## 2018-10-24 DIAGNOSIS — R109 Unspecified abdominal pain: Secondary | ICD-10-CM | POA: Insufficient documentation

## 2018-10-24 DIAGNOSIS — Z3201 Encounter for pregnancy test, result positive: Secondary | ICD-10-CM | POA: Diagnosis not present

## 2018-10-24 LAB — URINALYSIS, ROUTINE W REFLEX MICROSCOPIC
BILIRUBIN URINE: NEGATIVE
GLUCOSE, UA: NEGATIVE mg/dL
Hgb urine dipstick: NEGATIVE
KETONES UR: NEGATIVE mg/dL
Nitrite: NEGATIVE
PH: 7 (ref 5.0–8.0)
Protein, ur: NEGATIVE mg/dL
SPECIFIC GRAVITY, URINE: 1.009 (ref 1.005–1.030)

## 2018-10-24 LAB — POCT PREGNANCY, URINE: Preg Test, Ur: POSITIVE — AB

## 2018-10-24 NOTE — MAU Note (Signed)
When asked pt to please undress and put the hosp gown on to allow for examination, she declined saying, " I do not need an exam, I do not hurt and I am not bleeding. I am nauseated and just wanted confirmation that I was pregnant.  When asked if she was certain that her pregnancy was in the uterus, she responded, " No, but I would be in pain if that were so. This is my 3rd pregnancy, I am fine. I do not need a Doctor to examine me. I want proof that I am pregnant to take to [facility] office then I can go home."

## 2018-10-24 NOTE — MAU Note (Signed)
Pt did not want any care given today. She signed the E sign pad and left the hospital with her proof of preg letter.and her family .

## 2018-10-24 NOTE — MAU Provider Note (Signed)
  History     CSN: [id number]  Arrival date and time: 10/24/18 1751   None     Chief Complaint  Patient presents with  . Abdominal Pain  . Possible Pregnancy   Patient Vitals for the past 24 hrs:  BP Temp Temp src Pulse Resp SpO2 Weight  10/24/18 1838 125/75 97.9 F (36.6 C) Oral 62 17 100 % 100.4 kg    See separate RN note. Patient was triaged and room. When she was was asked to don a gown for Provider assessment she declined, stating she did not wish to see a Provider. She was made aware of risks of pregnancy of unknown location and concern for ectopic. She again declined Provider involvement and workup for first trimester abdominal cramping. She verbalized to RN she only intended to obtain pregnancy confirmation letter.  Crissie Figures Vann Okerlund,CNM 10/24/2018, 8:26 PM

## 2018-10-24 NOTE — MAU Note (Signed)
Feeling nausea and cramping.  Took a preg test at home, was positive- just wants to confirm. No bleeding, cramping seems  Like normal menstrual cramp.Erica Roth

## 2018-11-23 NOTE — L&D Delivery Note (Signed)
Delivery Note At 8:35 PM a viable female was delivered via Vaginal, Spontaneous (Presentation: vertex; ROA). APGAR: 9, 9; weight  .   Placenta status: delivered, intact.  Cord:3vc with the following complications: none.  Cord pH: n/a  Anesthesia:  fentanyl Episiotomy: None Lacerations: 2nd degree;Perineal Suture Repair: 3.0 monocryl Est. Blood Loss (mL): 85  Mom to postpartum.  Baby to Couplet care / Skin to Skin.  Truett Mainland 06/20/2019, 8:58 PM

## 2018-12-26 ENCOUNTER — Other Ambulatory Visit (HOSPITAL_COMMUNITY)
Admission: RE | Admit: 2018-12-26 | Discharge: 2018-12-26 | Disposition: A | Discharge status: Home / Self Care | Payer: Medicaid Other | Source: Ambulatory Visit | Attending: Obstetrics and Gynecology | Admitting: Obstetrics and Gynecology

## 2018-12-26 ENCOUNTER — Encounter: Payer: Self-pay | Admitting: Obstetrics and Gynecology

## 2018-12-26 ENCOUNTER — Ambulatory Visit (INDEPENDENT_AMBULATORY_CARE_PROVIDER_SITE_OTHER): Payer: Medicaid Other | Admitting: Obstetrics and Gynecology

## 2018-12-26 DIAGNOSIS — O0992 Supervision of high risk pregnancy, unspecified, second trimester: Secondary | ICD-10-CM

## 2018-12-26 DIAGNOSIS — O0990 Supervision of high risk pregnancy, unspecified, unspecified trimester: Secondary | ICD-10-CM | POA: Insufficient documentation

## 2018-12-26 DIAGNOSIS — O09529 Supervision of elderly multigravida, unspecified trimester: Secondary | ICD-10-CM | POA: Insufficient documentation

## 2018-12-26 DIAGNOSIS — O09522 Supervision of elderly multigravida, second trimester: Secondary | ICD-10-CM

## 2018-12-26 MED ORDER — PRENATE PIXIE 10-0.6-0.4-200 MG PO CAPS: 1.0000 | ORAL_CAPSULE | Freq: Every day | ORAL | 12 refills | Status: DC

## 2018-12-26 NOTE — Progress Notes (Signed)
Patient is in the office for NEW OB, denies pain. Pt has not started feeling fetal movement. Declined Panorama

## 2018-12-26 NOTE — Addendum Note (Signed)
Addended byCleotilde Neer on: 12/26/2018 04:23 PM   Modules accepted: Orders

## 2018-12-26 NOTE — Progress Notes (Signed)
  Subjective:    Erica Roth is a [id number] [redacted]w[redacted]d being seen today for her first obstetrical visit.  Her obstetrical history is significant for advanced maternal age, obesity and history of 34 week delivery . Patient does intend to breast feed. Pregnancy history fully reviewed.  Patient reports no complaints.  Vitals:   12/26/18 1450  BP: 114/76  Pulse: 79  Weight: 220 lb 4.8 oz (99.9 kg)    HISTORY: OB History  Gravida Para Term Preterm AB Living  3 2 1 1   2   SAB TAB Ectopic Multiple Live Births        0 2    # Outcome Date GA Lbr Len/2nd Weight Sex Delivery Anes PTL Lv  3 Current           2 Term 04/21/17 [redacted]w[redacted]d 07:38 / 01:07 6 lb 10.4 oz (3.016 kg) F Vag-Spont None  LIV  1 Preterm 03/09/16 [redacted]w[redacted]d 00:42 / 01:23 4 lb 0.9 oz (1.84 kg) F Vag-Spont EPI  LIV     Birth Comments: skeletal deformity in right leg   Past Medical History:  Diagnosis Date  . Fibroid   . Preterm labor    Past Surgical History:  Procedure Laterality Date  . NO PAST SURGERIES     Family History  Problem Relation Age of Onset  . Hypertension Mother      Exam    Uterus:   Patient declined pelvic exam  Pelvic Exam:   System: Breast:  normal appearance, no masses or tenderness   Skin: normal coloration and turgor, no rashes    Neurologic: oriented, no focal deficits   Extremities: normal strength, tone, and muscle mass   HEENT extra ocular movement intact   Mouth/Teeth mucous membranes moist, pharynx normal without lesions and dental hygiene good   Neck supple and no masses   Cardiovascular: regular rate and rhythm   Respiratory:  appears well, vitals normal, no respiratory distress, acyanotic, normal RR, chest clear, no wheezing, crepitations, rhonchi, normal symmetric air entry   Abdomen: soft, non-tender; bowel sounds normal; no masses,  no organomegaly   Urinary:       Assessment:    Pregnancy: [id number] Patient Active Problem List   Diagnosis Date Noted  . Supervision of high risk  pregnancy, antepartum 12/26/2018  . Fibroid uterus 12/29/2016  . History of preterm delivery, currently pregnant 11/02/2016  . Fibroids 10/26/2015        Plan:     Initial labs drawn. Prenatal vitamins. Problem list reviewed and updated. Genetic Screening discussed : panorama declined.  Ultrasound discussed; fetal survey: ordered. Patient with history of 34 weeks delivered followed by a full term pregnancy without intervention- patient declined 17-P Patient declined pelvic exam and agrees to pap smear next visit  Follow up in 4 weeks. 50% of 30 min visit spent on counseling and coordination of care.     Erica Roth 12/26/2018

## 2018-12-26 NOTE — Addendum Note (Signed)
Addended by: Mora Bellman on: 12/26/2018 03:29 PM   Modules accepted: Orders

## 2018-12-27 LAB — URINE CYTOLOGY ANCILLARY ONLY
Chlamydia: NEGATIVE
NEISSERIA GONORRHEA: NEGATIVE

## 2018-12-28 LAB — CULTURE, OB URINE

## 2018-12-28 LAB — URINE CULTURE, OB REFLEX

## 2019-01-04 LAB — SMN1 COPY NUMBER ANALYSIS (SMA CARRIER SCREENING)

## 2019-01-04 LAB — AFP, SERUM, OPEN SPINA BIFIDA
AFP MoM: 1.19
AFP Value: 29.4 ng/mL
GEST. AGE ON COLLECTION DATE: 15.6 wk
Maternal Age At EDD: 35.7 yr
OSBR Risk 1 IN: 6704
Test Results:: NEGATIVE
WEIGHT: 221 [lb_av]

## 2019-01-04 LAB — OBSTETRIC PANEL, INCLUDING HIV
ANTIBODY SCREEN: NEGATIVE
BASOS: 1 %
Basophils Absolute: 0 10*3/uL (ref 0.0–0.2)
EOS (ABSOLUTE): 0 10*3/uL (ref 0.0–0.4)
EOS: 0 %
HEMATOCRIT: 36.5 % (ref 34.0–46.6)
HEMOGLOBIN: 12.4 g/dL (ref 11.1–15.9)
HIV Screen 4th Generation wRfx: NONREACTIVE
Hepatitis B Surface Ag: NEGATIVE
IMMATURE GRANS (ABS): 0 10*3/uL (ref 0.0–0.1)
Immature Granulocytes: 0 %
LYMPHS: 27 %
Lymphocytes Absolute: 2.1 10*3/uL (ref 0.7–3.1)
MCH: 29.5 pg (ref 26.6–33.0)
MCHC: 34 g/dL (ref 31.5–35.7)
MCV: 87 fL (ref 79–97)
MONOS ABS: 0.4 10*3/uL (ref 0.1–0.9)
Monocytes: 5 %
Neutrophils Absolute: 5.4 10*3/uL (ref 1.4–7.0)
Neutrophils: 67 %
Platelets: 222 10*3/uL (ref 150–450)
RBC: 4.2 x10E6/uL (ref 3.77–5.28)
RDW: 13.1 % (ref 11.7–15.4)
RH TYPE: POSITIVE
RPR: NONREACTIVE
RUBELLA: 8.35 {index} (ref >0.99)
WBC: 8 10*3/uL (ref 3.4–10.8)

## 2019-01-04 LAB — HEMOGLOBIN A1C
Est. average glucose Bld gHb Est-mCnc: 103 mg/dL
Hgb A1c MFr Bld: 5.2 % (ref 4.8–5.6)

## 2019-01-09 ENCOUNTER — Encounter (HOSPITAL_COMMUNITY): Payer: Self-pay

## 2019-01-17 ENCOUNTER — Ambulatory Visit (HOSPITAL_COMMUNITY)
Admission: RE | Admit: 2019-01-17 | Discharge: 2019-01-17 | Disposition: A | Discharge status: Home / Self Care | Payer: Medicaid Other | Source: Ambulatory Visit | Attending: Obstetrics and Gynecology | Admitting: Obstetrics and Gynecology

## 2019-01-17 ENCOUNTER — Other Ambulatory Visit (HOSPITAL_COMMUNITY): Payer: Self-pay | Admitting: *Deleted

## 2019-01-17 ENCOUNTER — Encounter (HOSPITAL_COMMUNITY): Payer: Self-pay

## 2019-01-17 ENCOUNTER — Ambulatory Visit (HOSPITAL_COMMUNITY): Payer: Medicaid Other | Admitting: *Deleted

## 2019-01-17 DIAGNOSIS — O09522 Supervision of elderly multigravida, second trimester: Secondary | ICD-10-CM

## 2019-01-17 DIAGNOSIS — O0990 Supervision of high risk pregnancy, unspecified, unspecified trimester: Secondary | ICD-10-CM | POA: Diagnosis present

## 2019-01-17 DIAGNOSIS — Z3A19 19 weeks gestation of pregnancy: Secondary | ICD-10-CM | POA: Diagnosis not present

## 2019-01-17 DIAGNOSIS — Z362 Encounter for other antenatal screening follow-up: Secondary | ICD-10-CM

## 2019-01-17 DIAGNOSIS — Z363 Encounter for antenatal screening for malformations: Secondary | ICD-10-CM | POA: Diagnosis not present

## 2019-01-17 DIAGNOSIS — O3412 Maternal care for benign tumor of corpus uteri, second trimester: Secondary | ICD-10-CM | POA: Diagnosis not present

## 2019-01-23 ENCOUNTER — Ambulatory Visit (INDEPENDENT_AMBULATORY_CARE_PROVIDER_SITE_OTHER): Payer: Medicaid Other | Admitting: Obstetrics and Gynecology

## 2019-01-23 ENCOUNTER — Other Ambulatory Visit (HOSPITAL_COMMUNITY)
Admission: RE | Admit: 2019-01-23 | Discharge: 2019-01-23 | Disposition: A | Discharge status: Home / Self Care | Payer: Medicaid Other | Source: Ambulatory Visit | Attending: Obstetrics and Gynecology | Admitting: Obstetrics and Gynecology

## 2019-01-23 ENCOUNTER — Encounter: Payer: Self-pay | Admitting: Obstetrics and Gynecology

## 2019-01-23 VITALS — BP 104/68 | HR 74 | Wt 222.4 lb

## 2019-01-23 DIAGNOSIS — O09899 Supervision of other high risk pregnancies, unspecified trimester: Secondary | ICD-10-CM

## 2019-01-23 DIAGNOSIS — Z3481 Encounter for supervision of other normal pregnancy, first trimester: Secondary | ICD-10-CM

## 2019-01-23 DIAGNOSIS — O09522 Supervision of elderly multigravida, second trimester: Secondary | ICD-10-CM

## 2019-01-23 DIAGNOSIS — O0990 Supervision of high risk pregnancy, unspecified, unspecified trimester: Secondary | ICD-10-CM | POA: Diagnosis not present

## 2019-01-23 DIAGNOSIS — Z3A19 19 weeks gestation of pregnancy: Secondary | ICD-10-CM

## 2019-01-23 DIAGNOSIS — O09219 Supervision of pregnancy with history of pre-term labor, unspecified trimester: Secondary | ICD-10-CM

## 2019-01-23 NOTE — Progress Notes (Signed)
   PRENATAL VISIT NOTE  Subjective:  Erica Roth is a 38 y.o. S2L9532 at [redacted]w[redacted]d being seen today for ongoing prenatal care.  She is currently monitored for the following issues for this high-risk pregnancy and has Fibroids; History of preterm delivery, currently pregnant; Fibroid uterus; Supervision of high risk pregnancy, antepartum; and AMA (advanced maternal age) multigravida 35+ on their problem list.  Patient reports no complaints.  Contractions: Not present. Vag. Bleeding: None.  Movement: Present. Denies leaking of fluid.   The following portions of the patient's history were reviewed and updated as appropriate: allergies, current medications, past family history, past medical history, past social history, past surgical history and problem list. Problem list updated.  Objective:   Vitals:   01/23/19 1407  BP: 104/68  Pulse: 74  Weight: 222 lb 6.4 oz (100.9 kg)    Fetal Status: Fetal Heart Rate (bpm): 150   Movement: Present     General:  Alert, oriented and cooperative. Patient is in no acute distress.  Skin: Skin is warm and dry. No rash noted.   Cardiovascular: Normal heart rate noted  Respiratory: Normal respiratory effort, no problems with respiration noted  Abdomen: Soft, gravid, appropriate for gestational age.  Pain/Pressure: Absent     Pelvic: Cervical exam deferred        Extremities: Normal range of motion.  Edema: None  Mental Status: Normal mood and affect. Normal behavior. Normal judgment and thought content.   Assessment and Plan:  Pregnancy: G3P1102 at [redacted]w[redacted]d  1. Supervision of high risk pregnancy, antepartum Patient is doing well without complaints Pap smear today Follow up anatomy ultrasound  2. History of preterm delivery, currently pregnant Patient declines 17-P  3. Multigravida of advanced maternal age in second trimester   Preterm labor symptoms and general obstetric precautions including but not limited to vaginal bleeding, contractions,  leaking of fluid and fetal movement were reviewed in detail with the patient. Please refer to After Visit Summary for other counseling recommendations.  Return in about 4 weeks (around 02/20/2019) for ROB.  Future Appointments  Date Time Provider [place]  01/23/2019  2:30 PM [place], Vickii Chafe, MD [place] None  02/13/2019  2:20 PM [place] MFC-US  02/13/2019  2:30 PM [place] 1 WH-MFCUS MFC-US    Mora Bellman, MD

## 2019-01-26 LAB — CYTOLOGY - PAP
Adequacy: ABSENT
DIAGNOSIS: NEGATIVE
HPV (WINDOPATH): NOT DETECTED

## 2019-02-13 ENCOUNTER — Ambulatory Visit (HOSPITAL_COMMUNITY): Payer: Medicaid Other | Admitting: *Deleted

## 2019-02-13 ENCOUNTER — Ambulatory Visit (HOSPITAL_COMMUNITY)
Admission: RE | Admit: 2019-02-13 | Discharge: 2019-02-13 | Disposition: A | Discharge status: Home / Self Care | Payer: Medicaid Other | Source: Ambulatory Visit | Attending: Obstetrics and Gynecology | Admitting: Obstetrics and Gynecology

## 2019-02-13 ENCOUNTER — Encounter (HOSPITAL_COMMUNITY): Payer: Self-pay | Admitting: *Deleted

## 2019-02-13 ENCOUNTER — Other Ambulatory Visit (HOSPITAL_COMMUNITY): Payer: Self-pay | Admitting: *Deleted

## 2019-02-13 ENCOUNTER — Other Ambulatory Visit: Payer: Self-pay

## 2019-02-13 DIAGNOSIS — O3413 Maternal care for benign tumor of corpus uteri, third trimester: Principal | ICD-10-CM

## 2019-02-13 DIAGNOSIS — O0990 Supervision of high risk pregnancy, unspecified, unspecified trimester: Secondary | ICD-10-CM

## 2019-02-13 DIAGNOSIS — O3412 Maternal care for benign tumor of corpus uteri, second trimester: Secondary | ICD-10-CM

## 2019-02-13 DIAGNOSIS — D259 Leiomyoma of uterus, unspecified: Secondary | ICD-10-CM

## 2019-02-13 DIAGNOSIS — Z3A22 22 weeks gestation of pregnancy: Secondary | ICD-10-CM

## 2019-02-13 DIAGNOSIS — O09522 Supervision of elderly multigravida, second trimester: Secondary | ICD-10-CM

## 2019-02-13 DIAGNOSIS — Z362 Encounter for other antenatal screening follow-up: Secondary | ICD-10-CM | POA: Insufficient documentation

## 2019-02-20 ENCOUNTER — Ambulatory Visit (INDEPENDENT_AMBULATORY_CARE_PROVIDER_SITE_OTHER): Payer: Medicaid Other | Admitting: Obstetrics and Gynecology

## 2019-02-20 ENCOUNTER — Encounter: Payer: Self-pay | Admitting: Obstetrics and Gynecology

## 2019-02-20 ENCOUNTER — Other Ambulatory Visit: Payer: Self-pay

## 2019-02-20 DIAGNOSIS — O0990 Supervision of high risk pregnancy, unspecified, unspecified trimester: Secondary | ICD-10-CM

## 2019-02-20 DIAGNOSIS — O09899 Supervision of other high risk pregnancies, unspecified trimester: Secondary | ICD-10-CM

## 2019-02-20 DIAGNOSIS — O09219 Supervision of pregnancy with history of pre-term labor, unspecified trimester: Secondary | ICD-10-CM

## 2019-02-20 DIAGNOSIS — O09212 Supervision of pregnancy with history of pre-term labor, second trimester: Secondary | ICD-10-CM | POA: Diagnosis not present

## 2019-02-20 DIAGNOSIS — Z3A23 23 weeks gestation of pregnancy: Secondary | ICD-10-CM

## 2019-02-20 DIAGNOSIS — O09522 Supervision of elderly multigravida, second trimester: Secondary | ICD-10-CM | POA: Diagnosis not present

## 2019-02-20 NOTE — Progress Notes (Signed)
[place] ROB.  Reports no major complaints today.

## 2019-02-20 NOTE — Progress Notes (Signed)
   TELEHEALTH VIRTUAL OBSTETRICS VISIT ENCOUNTER NOTE  I connected with Erica Roth on 02/20/19 at  2:30 PM EDT by telephone at home and verified that I am speaking with the correct person using two identifiers.   I discussed the limitations, risks, security and privacy concerns of performing an evaluation and management service by telephone and the availability of in person appointments. I also discussed with the patient that there may be a patient responsible charge related to this service. The patient expressed understanding and agreed to proceed.  Subjective:  Erica Roth is a 38 y.o. M1D6222 at [redacted]w[redacted]d being followed for ongoing prenatal care.  She is currently monitored for the following issues for this high-risk pregnancy and has Fibroids; History of preterm delivery, currently pregnant; Fibroid uterus; Supervision of high risk pregnancy, antepartum; and AMA (advanced maternal age) multigravida 35+ on their problem list.  Patient reports no complaints. Reports fetal movement. Denies any contractions, bleeding or leaking of fluid.   The following portions of the patient's history were reviewed and updated as appropriate: allergies, current medications, past family history, past medical history, past social history, past surgical history and problem list.   Objective:   General:  Alert, oriented and cooperative.   Mental Status: Normal mood and affect perceived. Normal judgment and thought content.  Rest of physical exam deferred due to type of encounter  Assessment and Plan:  Pregnancy: G3P1102 at [redacted]w[redacted]d 1. Supervision of high risk pregnancy, antepartum Patient is doing well without complaints Third trimester labs next visit Growth ultrasound 5/18  2. Multigravida of advanced maternal age in second trimester Low risks NIPS  3. History of preterm delivery, currently pregnant Declined 17-P  Preterm labor symptoms and general obstetric precautions including but not limited to  vaginal bleeding, contractions, leaking of fluid and fetal movement were reviewed in detail with the patient.  I discussed the assessment and treatment plan with the patient. The patient was provided an opportunity to ask questions and all were answered. The patient agreed with the plan and demonstrated an understanding of the instructions. The patient was advised to call back or seek an in-person office evaluation/go to MAU at [facility] for any urgent or concerning symptoms. Please refer to After Visit Summary for other counseling recommendations.   I provided 15 minutes of non-face-to-face time during this encounter.  Return in about 4 weeks (around 03/20/2019) for ROB, 2 hr glucola next visit.  Future Appointments  Date Time Provider [place]  04/10/2019  3:00 PM [place] [facility] [place] MFC-US  04/10/2019  3:00 PM [place] 3 WH-MFCUS MFC-US    Mora Bellman, MD [facility] for [employer], [place]

## 2019-03-20 ENCOUNTER — Encounter: Payer: Medicaid Other | Admitting: Obstetrics & Gynecology

## 2019-03-20 ENCOUNTER — Other Ambulatory Visit: Payer: Medicaid Other

## 2019-03-27 ENCOUNTER — Other Ambulatory Visit: Payer: Medicaid Other

## 2019-03-27 ENCOUNTER — Other Ambulatory Visit: Payer: Self-pay

## 2019-03-27 ENCOUNTER — Ambulatory Visit (INDEPENDENT_AMBULATORY_CARE_PROVIDER_SITE_OTHER): Payer: Medicaid Other | Admitting: Obstetrics and Gynecology

## 2019-03-27 ENCOUNTER — Encounter: Payer: Self-pay | Admitting: Obstetrics and Gynecology

## 2019-03-27 VITALS — BP 120/81 | HR 83 | Wt 226.5 lb

## 2019-03-27 DIAGNOSIS — Z3A28 28 weeks gestation of pregnancy: Secondary | ICD-10-CM

## 2019-03-27 DIAGNOSIS — O0990 Supervision of high risk pregnancy, unspecified, unspecified trimester: Secondary | ICD-10-CM

## 2019-03-27 DIAGNOSIS — O09523 Supervision of elderly multigravida, third trimester: Secondary | ICD-10-CM

## 2019-03-27 MED ORDER — PANTOPRAZOLE SODIUM 40 MG PO TBEC: 40.0000 mg | DELAYED_RELEASE_TABLET | Freq: Every day | ORAL | 3 refills | Status: DC

## 2019-03-27 NOTE — Progress Notes (Signed)
   PRENATAL VISIT NOTE  Subjective:  Erica Roth is a 38 y.o. E3X5400 at [redacted]w[redacted]d being seen today for ongoing prenatal care.  She is currently monitored for the following issues for this low-risk pregnancy and has Fibroids; History of preterm delivery, currently pregnant; Fibroid uterus; Supervision of high risk pregnancy, antepartum; and AMA (advanced maternal age) multigravida 35+ on their problem list.  Patient reports hear.  Contractions: Not present. Vag. Bleeding: None.  Movement: Present. Denies leaking of fluid.   The following portions of the patient's history were reviewed and updated as appropriate: allergies, current medications, past family history, past medical history, past social history, past surgical history and problem list.   Objective:   Vitals:   03/27/19 0904  BP: 120/81  Pulse: 83  Weight: 226 lb 8 oz (102.7 kg)    Fetal Status: Fetal Heart Rate (bpm): 152 Fundal Height: 29 cm Movement: Present     General:  Alert, oriented and cooperative. Patient is in no acute distress.  Skin: Skin is warm and dry. No rash noted.   Cardiovascular: Normal heart rate noted  Respiratory: Normal respiratory effort, no problems with respiration noted  Abdomen: Soft, gravid, appropriate for gestational age.  Pain/Pressure: Absent     Pelvic: Cervical exam deferred        Extremities: Normal range of motion.  Edema: Trace  Mental Status: Normal mood and affect. Normal behavior. Normal judgment and thought content.   Assessment and Plan:  Pregnancy: G3P1102 at [redacted]w[redacted]d 1. Supervision of high risk pregnancy, antepartum Patient is doing well Third trimester labs today Rx heartburn provided Patient to contact us if she does not hear from BR[facility] this week regarding BP cuff Patient understands that her next visit will be a Webex visit - Babyscripts Schedule Optimization - Glucose Tolerance, 2 Hours w/1 Hour - HIV Antibody (routine testing w rflx) - RPR - CBC  2. Multigravida of  advanced maternal age in third trimester Negative screening  Preterm labor symptoms and general obstetric precautions including but not limited to vaginal bleeding, contractions, leaking of fluid and fetal movement were reviewed in detail with the patient. Please refer to After Visit Summary for other counseling recommendations.   No follow-ups on file.  Future Appointments  Date Time Provider [place]  03/27/2019 11:15 AM [place], Vickii Chafe, MD [facility] None  04/10/2019  3:00 PM [place] [facility] [place] MFC-US  04/10/2019  3:00 PM [place] 3 WH-MFCUS MFC-US    Mora Bellman, MD

## 2019-03-27 NOTE — Progress Notes (Unsigned)
ROB presents for 2 gtt labs. Declined Tdap.  Pt does not have the Babyscripts App for BP cuff.

## 2019-03-27 NOTE — Progress Notes (Signed)
ROB presents for 2 gtt labs. Declined Tdap.  Pt needs Babyscript link again to receive BP cuff.

## 2019-03-28 LAB — RPR: RPR Ser Ql: NONREACTIVE

## 2019-03-28 LAB — CBC
Hematocrit: 34.7 % (ref 34.0–46.6)
Hemoglobin: 11.1 g/dL (ref 11.1–15.9)
MCH: 28.8 pg (ref 26.6–33.0)
MCHC: 32 g/dL (ref 31.5–35.7)
MCV: 90 fL (ref 79–97)
Platelets: 184 10*3/uL (ref 150–450)
RBC: 3.86 x10E6/uL (ref 3.77–5.28)
RDW: 13.7 % (ref 11.7–15.4)
WBC: 7.1 10*3/uL (ref 3.4–10.8)

## 2019-03-28 LAB — GLUCOSE TOLERANCE, 2 HOURS W/ 1HR
Glucose, 1 hour: 127 mg/dL (ref 65–179)
Glucose, 2 hour: 122 mg/dL (ref 65–152)
Glucose, Fasting: 84 mg/dL (ref 65–91)

## 2019-03-28 LAB — HIV ANTIBODY (ROUTINE TESTING W REFLEX): HIV Screen 4th Generation wRfx: NONREACTIVE

## 2019-04-10 ENCOUNTER — Encounter: Payer: Medicaid Other | Admitting: Obstetrics & Gynecology

## 2019-04-10 ENCOUNTER — Ambulatory Visit (HOSPITAL_COMMUNITY): Admission: RE | Admit: 2019-04-10 | Payer: Medicaid Other | Source: Ambulatory Visit

## 2019-04-10 ENCOUNTER — Ambulatory Visit (HOSPITAL_COMMUNITY): Payer: Medicaid Other

## 2019-04-12 ENCOUNTER — Encounter: Payer: Self-pay | Admitting: Obstetrics

## 2019-04-12 ENCOUNTER — Other Ambulatory Visit: Payer: Self-pay

## 2019-04-12 ENCOUNTER — Ambulatory Visit (INDEPENDENT_AMBULATORY_CARE_PROVIDER_SITE_OTHER): Payer: Medicaid Other | Admitting: Obstetrics

## 2019-04-12 DIAGNOSIS — O09523 Supervision of elderly multigravida, third trimester: Secondary | ICD-10-CM

## 2019-04-12 DIAGNOSIS — O09893 Supervision of other high risk pregnancies, third trimester: Secondary | ICD-10-CM | POA: Diagnosis not present

## 2019-04-12 DIAGNOSIS — Z3A31 31 weeks gestation of pregnancy: Secondary | ICD-10-CM

## 2019-04-12 DIAGNOSIS — O09213 Supervision of pregnancy with history of pre-term labor, third trimester: Secondary | ICD-10-CM

## 2019-04-12 DIAGNOSIS — O0990 Supervision of high risk pregnancy, unspecified, unspecified trimester: Secondary | ICD-10-CM

## 2019-04-12 DIAGNOSIS — O0993 Supervision of high risk pregnancy, unspecified, third trimester: Secondary | ICD-10-CM | POA: Diagnosis not present

## 2019-04-12 DIAGNOSIS — O09899 Supervision of other high risk pregnancies, unspecified trimester: Secondary | ICD-10-CM

## 2019-04-12 MED ORDER — BLOOD PRESSURE MONITOR DEVI: 1.0000 | 0 refills | Status: DC

## 2019-04-12 NOTE — Addendum Note (Signed)
Addended by: Shelly Bombard on: 04/12/2019 02:32 PM   Modules accepted: Orders

## 2019-04-12 NOTE — Progress Notes (Signed)
   TELEHEALTH VIRTUAL OBSTETRICS VISIT ENCOUNTER NOTE  I connected with Erica Roth on 04/12/19 at  2:00 PM EDT by telephone at home and verified that I am speaking with the correct person using two identifiers.   I discussed the limitations, risks, security and privacy concerns of performing an evaluation and management service by telephone and the availability of in person appointments. I also discussed with the patient that there may be a patient responsible charge related to this service. The patient expressed understanding and agreed to proceed.  Subjective:  Erica Roth is a 38 y.o. T3S2876 at [redacted]w[redacted]d being followed for ongoing prenatal care.  She is currently monitored for the following issues for this high-risk pregnancy and has Fibroids; History of preterm delivery, currently pregnant; Fibroid uterus; Supervision of high risk pregnancy, antepartum; and AMA (advanced maternal age) multigravida 35+ on their problem list.  Patient reports no complaints. Reports fetal movement. Denies any contractions, bleeding or leaking of fluid.   The following portions of the patient's history were reviewed and updated as appropriate: allergies, current medications, past family history, past medical history, past social history, past surgical history and problem list.   Objective:   General:  Alert, oriented and cooperative.   Mental Status: Normal mood and affect perceived. Normal judgment and thought content.  Rest of physical exam deferred due to type of encounter  Assessment and Plan:  Pregnancy: G3P1102 at [redacted]w[redacted]d 1. Supervision of high risk pregnancy, antepartum Rx: - Blood Pressure Monitor DEVI; 1 each by Does not apply route once a week. Check Blood Pressure weekly, Large cuff  Dispense: 1 Device; Refill: 0  2. Multigravida of advanced maternal age in third trimester  3. History of preterm delivery, currently pregnant  4. Short interval between pregnancies affecting pregnancy,  antepartum   Preterm labor symptoms and general obstetric precautions including but not limited to vaginal bleeding, contractions, leaking of fluid and fetal movement were reviewed in detail with the patient.  I discussed the assessment and treatment plan with the patient. The patient was provided an opportunity to ask questions and all were answered. The patient agreed with the plan and demonstrated an understanding of the instructions. The patient was advised to call back or seek an in-person office evaluation/go to MAU at [facility] for any urgent or concerning symptoms. Please refer to After Visit Summary for other counseling recommendations.   I provided 10 minutes of non-face-to-face time during this encounter.  Return in about 2 weeks (around 04/26/2019) for [facility].  Future Appointments  Date Time Provider [place]  04/14/2019  3:00 PM [place] MFC-US  04/14/2019  3:00 PM [place], [place] for [facility] [facility] 04-12-2019

## 2019-04-12 NOTE — Progress Notes (Signed)
Webex ROB.  She has question about the Heartburn Meds.

## 2019-04-14 ENCOUNTER — Encounter (HOSPITAL_COMMUNITY): Payer: Self-pay | Admitting: *Deleted

## 2019-04-14 ENCOUNTER — Ambulatory Visit (HOSPITAL_COMMUNITY)
Admission: RE | Admit: 2019-04-14 | Discharge: 2019-04-14 | Disposition: A | Discharge status: Home / Self Care | Payer: Medicaid Other | Source: Ambulatory Visit | Attending: Obstetrics and Gynecology | Admitting: Obstetrics and Gynecology

## 2019-04-14 ENCOUNTER — Ambulatory Visit (HOSPITAL_COMMUNITY): Payer: Medicaid Other | Admitting: *Deleted

## 2019-04-14 ENCOUNTER — Other Ambulatory Visit: Payer: Self-pay

## 2019-04-14 DIAGNOSIS — O09523 Supervision of elderly multigravida, third trimester: Secondary | ICD-10-CM | POA: Diagnosis present

## 2019-04-14 DIAGNOSIS — O3413 Maternal care for benign tumor of corpus uteri, third trimester: Secondary | ICD-10-CM | POA: Diagnosis present

## 2019-04-14 DIAGNOSIS — D259 Leiomyoma of uterus, unspecified: Secondary | ICD-10-CM | POA: Diagnosis present

## 2019-04-14 DIAGNOSIS — Z362 Encounter for other antenatal screening follow-up: Secondary | ICD-10-CM | POA: Diagnosis not present

## 2019-04-14 DIAGNOSIS — O0990 Supervision of high risk pregnancy, unspecified, unspecified trimester: Secondary | ICD-10-CM | POA: Diagnosis present

## 2019-04-14 DIAGNOSIS — O09213 Supervision of pregnancy with history of pre-term labor, third trimester: Secondary | ICD-10-CM | POA: Diagnosis not present

## 2019-04-14 DIAGNOSIS — Z3A31 31 weeks gestation of pregnancy: Secondary | ICD-10-CM

## 2019-04-26 ENCOUNTER — Encounter: Payer: Self-pay | Admitting: Obstetrics

## 2019-04-26 ENCOUNTER — Ambulatory Visit (INDEPENDENT_AMBULATORY_CARE_PROVIDER_SITE_OTHER): Payer: Medicaid Other | Admitting: Obstetrics

## 2019-04-26 VITALS — BP 111/71

## 2019-04-26 DIAGNOSIS — O0990 Supervision of high risk pregnancy, unspecified, unspecified trimester: Secondary | ICD-10-CM

## 2019-04-26 DIAGNOSIS — O09899 Supervision of other high risk pregnancies, unspecified trimester: Secondary | ICD-10-CM

## 2019-04-26 DIAGNOSIS — Z3A33 33 weeks gestation of pregnancy: Secondary | ICD-10-CM

## 2019-04-26 DIAGNOSIS — O09893 Supervision of other high risk pregnancies, third trimester: Secondary | ICD-10-CM | POA: Diagnosis not present

## 2019-04-26 DIAGNOSIS — O09523 Supervision of elderly multigravida, third trimester: Secondary | ICD-10-CM

## 2019-04-26 DIAGNOSIS — O09213 Supervision of pregnancy with history of pre-term labor, third trimester: Secondary | ICD-10-CM | POA: Diagnosis not present

## 2019-04-26 NOTE — Progress Notes (Signed)
   TELEHEALTH VIRTUAL OBSTETRICS VISIT ENCOUNTER NOTE  I connected with Erica Roth on 04/26/19 at  3:00 PM EDT by telephone at home and verified that I am speaking with the correct person using two identifiers.   I discussed the limitations, risks, security and privacy concerns of performing an evaluation and management service by telephone and the availability of in person appointments. I also discussed with the patient that there may be a patient responsible charge related to this service. The patient expressed understanding and agreed to proceed.  Subjective:  Erica Roth is a 37 y.o. [id number] at [redacted]w[redacted]d being followed for ongoing prenatal care.  She is currently monitored for the following issues for this high-risk pregnancy and has Fibroids; History of preterm delivery, currently pregnant; Fibroid uterus; Supervision of high risk pregnancy, antepartum; and AMA (advanced maternal age) multigravida 35+ on their problem list.  Patient reports heartburn. Reports fetal movement. Denies any contractions, bleeding or leaking of fluid.   The following portions of the patient's history were reviewed and updated as appropriate: allergies, current medications, past family history, past medical history, past social history, past surgical history and problem list.   Objective:   General:  Alert, oriented and cooperative.   Mental Status: Normal mood and affect perceived. Normal judgment and thought content.  Rest of physical exam deferred due to type of encounter  Assessment and Plan:  Pregnancy: G3P1102 at [redacted]w[redacted]d 1. Supervision of high risk pregnancy, antepartum  2. History of preterm delivery, currently pregnant  3. Multigravida of advanced maternal age in third trimester  4. Short interval between pregnancies affecting pregnancy, antepartum   Preterm labor symptoms and general obstetric precautions including but not limited to vaginal bleeding, contractions, leaking of fluid and fetal  movement were reviewed in detail with the patient.  I discussed the assessment and treatment plan with the patient. The patient was provided an opportunity to ask questions and all were answered. The patient agreed with the plan and demonstrated an understanding of the instructions. The patient was advised to call back or seek an in-person office evaluation/go to MAU at [facility] for any urgent or concerning symptoms. Please refer to After Visit Summary for other counseling recommendations.   I provided 10 minutes of non-face-to-face time during this encounter.  No follow-ups on file.  No future appointments.  Baltazar Najjar, MD Center for [facility], [place] [facility] 04-26-2019

## 2019-04-26 NOTE — Progress Notes (Signed)
Televisit ROB Pt unable to download My chart and Web Ex

## 2019-05-10 ENCOUNTER — Encounter: Payer: Self-pay | Admitting: Obstetrics

## 2019-05-10 ENCOUNTER — Ambulatory Visit (INDEPENDENT_AMBULATORY_CARE_PROVIDER_SITE_OTHER): Payer: Medicaid Other | Admitting: Obstetrics

## 2019-05-10 VITALS — BP 101/61 | HR 81

## 2019-05-10 DIAGNOSIS — O0993 Supervision of high risk pregnancy, unspecified, third trimester: Secondary | ICD-10-CM | POA: Diagnosis not present

## 2019-05-10 DIAGNOSIS — Z3A35 35 weeks gestation of pregnancy: Secondary | ICD-10-CM | POA: Diagnosis not present

## 2019-05-10 DIAGNOSIS — O09523 Supervision of elderly multigravida, third trimester: Secondary | ICD-10-CM | POA: Diagnosis not present

## 2019-05-10 DIAGNOSIS — O0990 Supervision of high risk pregnancy, unspecified, unspecified trimester: Secondary | ICD-10-CM

## 2019-05-10 DIAGNOSIS — O09899 Supervision of other high risk pregnancies, unspecified trimester: Secondary | ICD-10-CM

## 2019-05-10 DIAGNOSIS — O09893 Supervision of other high risk pregnancies, third trimester: Secondary | ICD-10-CM

## 2019-05-10 NOTE — Progress Notes (Signed)
   TELEHEALTH VIRTUAL OBSTETRICS VISIT ENCOUNTER NOTE  I connected with Erica Roth on 05/10/19 at  3:00 PM EDT by telephone at home and verified that I am speaking with the correct person using two identifiers.   I discussed the limitations, risks, security and privacy concerns of performing an evaluation and management service by telephone and the availability of in person appointments. I also discussed with the patient that there may be a patient responsible charge related to this service. The patient expressed understanding and agreed to proceed.  Subjective:  Erica Roth is a 38 y.o. F7T0240 at [redacted]w[redacted]d being followed for ongoing prenatal care.  She is currently monitored for the following issues for this high-risk pregnancy and has Fibroids; History of preterm delivery, currently pregnant; Fibroid uterus; Supervision of high risk pregnancy, antepartum; and AMA (advanced maternal age) multigravida 35+ on their problem list.  Patient reports no complaints. Reports fetal movement. Denies any contractions, bleeding or leaking of fluid.   The following portions of the patient's history were reviewed and updated as appropriate: allergies, current medications, past family history, past medical history, past social history, past surgical history and problem list.   Objective:   General:  Alert, oriented and cooperative.   Mental Status: Normal mood and affect perceived. Normal judgment and thought content.  Rest of physical exam deferred due to type of encounter  Assessment and Plan:  Pregnancy: G3P1102 at [redacted]w[redacted]d There are no diagnoses linked to this encounter. Preterm labor symptoms and general obstetric precautions including but not limited to vaginal bleeding, contractions, leaking of fluid and fetal movement were reviewed in detail with the patient.  I discussed the assessment and treatment plan with the patient. The patient was provided an opportunity to ask questions and all were  answered. The patient agreed with the plan and demonstrated an understanding of the instructions. The patient was advised to call back or seek an in-person office evaluation/go to MAU at [facility] for any urgent or concerning symptoms. Please refer to After Visit Summary for other counseling recommendations.   I provided 10 minutes of non-face-to-face time during this encounter.  Return in about 2 weeks (around 05/24/2019) for ROB in person.  GBS.    Baltazar Najjar, MD Center for [facility], [place] [facility] 05-10-2019

## 2019-05-24 ENCOUNTER — Other Ambulatory Visit: Payer: Self-pay

## 2019-05-24 ENCOUNTER — Encounter: Payer: Self-pay | Admitting: Obstetrics & Gynecology

## 2019-05-24 ENCOUNTER — Other Ambulatory Visit (HOSPITAL_COMMUNITY)
Admission: RE | Admit: 2019-05-24 | Discharge: 2019-05-24 | Disposition: A | Discharge status: Home / Self Care | Payer: Medicaid Other | Source: Ambulatory Visit | Attending: Obstetrics & Gynecology | Admitting: Obstetrics & Gynecology

## 2019-05-24 ENCOUNTER — Ambulatory Visit (INDEPENDENT_AMBULATORY_CARE_PROVIDER_SITE_OTHER): Payer: Medicaid Other | Admitting: Obstetrics & Gynecology

## 2019-05-24 VITALS — BP 119/76 | HR 76 | Wt 238.0 lb

## 2019-05-24 DIAGNOSIS — O0990 Supervision of high risk pregnancy, unspecified, unspecified trimester: Secondary | ICD-10-CM

## 2019-05-24 DIAGNOSIS — O09523 Supervision of elderly multigravida, third trimester: Secondary | ICD-10-CM

## 2019-05-24 DIAGNOSIS — O0993 Supervision of high risk pregnancy, unspecified, third trimester: Secondary | ICD-10-CM

## 2019-05-24 DIAGNOSIS — Z3A37 37 weeks gestation of pregnancy: Secondary | ICD-10-CM

## 2019-05-24 LAB — OB RESULTS CONSOLE GC/CHLAMYDIA: Gonorrhea: NEGATIVE

## 2019-05-24 NOTE — Patient Instructions (Signed)

## 2019-05-24 NOTE — Progress Notes (Signed)
   PRENATAL VISIT NOTE  Subjective:  Erica Roth is a 38 y.o. [id number] at [redacted]w[redacted]d being seen today for ongoing prenatal care.  She is currently monitored for the following issues for this high-risk pregnancy and has Fibroids; History of preterm delivery, currently pregnant; Fibroid uterus; Supervision of high risk pregnancy, antepartum; and AMA (advanced maternal age) multigravida 35+ on their problem list.  Patient reports no complaints.  Contractions: Not present. Vag. Bleeding: None.  Movement: Present. Denies leaking of fluid.   The following portions of the patient's history were reviewed and updated as appropriate: allergies, current medications, past family history, past medical history, past social history, past surgical history and problem list.   Objective:   Vitals:   05/24/19 1505  BP: 119/76  Pulse: 76  Weight: 238 lb (108 kg)    Fetal Status: Fetal Heart Rate (bpm): 145 Fundal Height: 35 cm Movement: Present  Presentation: Vertex  General:  Alert, oriented and cooperative. Patient is in no acute distress.  Skin: Skin is warm and dry. No rash noted.   Cardiovascular: Normal heart rate noted  Respiratory: Normal respiratory effort, no problems with respiration noted  Abdomen: Soft, gravid, appropriate for gestational age.  Pain/Pressure: Absent     Pelvic: Cervical exam performed Dilation: Closed Effacement (%): 30 Station: Ballotable  Extremities: Normal range of motion.     Mental Status: Normal mood and affect. Normal behavior. Normal judgment and thought content.   Assessment and Plan:  Pregnancy: G3P1102 at [redacted]w[redacted]d 1. Supervision of high risk pregnancy, antepartum Routine testing - Strep Gp B NAA - Cervicovaginal ancillary only( [place])  2. Multigravida of advanced maternal age in third trimester Doing well  Term labor symptoms and general obstetric precautions including but not limited to vaginal bleeding, contractions, leaking of fluid and fetal movement  were reviewed in detail with the patient. Please refer to After Visit Summary for other counseling recommendations.   Return in about 1 week (around 05/31/2019) for virtual.  Future Appointments  Date Time Provider [place]  05/31/2019  4:00 PM Woodroe Mode, MD [facility] None    Emeterio Reeve, MD

## 2019-05-25 LAB — CERVICOVAGINAL ANCILLARY ONLY
Chlamydia: NEGATIVE
Neisseria Gonorrhea: NEGATIVE

## 2019-05-26 LAB — STREP GP B NAA: Strep Gp B NAA: NEGATIVE

## 2019-05-31 ENCOUNTER — Ambulatory Visit (INDEPENDENT_AMBULATORY_CARE_PROVIDER_SITE_OTHER): Payer: Medicaid Other | Admitting: Obstetrics & Gynecology

## 2019-05-31 ENCOUNTER — Other Ambulatory Visit: Payer: Self-pay

## 2019-05-31 VITALS — BP 112/65 | HR 74

## 2019-05-31 DIAGNOSIS — O09219 Supervision of pregnancy with history of pre-term labor, unspecified trimester: Secondary | ICD-10-CM

## 2019-05-31 DIAGNOSIS — O0990 Supervision of high risk pregnancy, unspecified, unspecified trimester: Secondary | ICD-10-CM

## 2019-05-31 DIAGNOSIS — O0993 Supervision of high risk pregnancy, unspecified, third trimester: Secondary | ICD-10-CM

## 2019-05-31 DIAGNOSIS — Z3A38 38 weeks gestation of pregnancy: Secondary | ICD-10-CM

## 2019-05-31 DIAGNOSIS — O09899 Supervision of other high risk pregnancies, unspecified trimester: Secondary | ICD-10-CM

## 2019-05-31 DIAGNOSIS — O09523 Supervision of elderly multigravida, third trimester: Secondary | ICD-10-CM

## 2019-05-31 NOTE — Progress Notes (Signed)
   TELEHEALTH VIRTUAL OBSTETRICS VISIT ENCOUNTER NOTE  I connected with Erica Roth on 05/31/19 at  4:00 PM EDT by telephone at home and verified that I am speaking with the correct person using two identifiers.   I discussed the limitations, risks, security and privacy concerns of performing an evaluation and management service by telephone and the availability of in person appointments. I also discussed with the patient that there may be a patient responsible charge related to this service. The patient expressed understanding and agreed to proceed.  Subjective:  Erica Roth is a 38 y.o. Q1F7588 at [redacted]w[redacted]d being followed for ongoing prenatal care.  She is currently monitored for the following issues for this high-risk pregnancy and has Fibroids; History of preterm delivery, currently pregnant; Fibroid uterus; Supervision of high risk pregnancy, antepartum; and AMA (advanced maternal age) multigravida 35+ on their problem list.  Patient reports no complaints. Reports fetal movement. Denies any contractions, bleeding or leaking of fluid.   The following portions of the patient's history were reviewed and updated as appropriate: allergies, current medications, past family history, past medical history, past social history, past surgical history and problem list.   Objective:   General:  Alert, oriented and cooperative.   Mental Status: Normal mood and affect perceived. Normal judgment and thought content.  Rest of physical exam deferred due to type of encounter  Assessment and Plan:  Pregnancy: G3P1102 at [redacted]w[redacted]d 1. Supervision of high risk pregnancy, antepartum   2. History of preterm delivery, currently pregnant Full term  3. Multigravida of advanced maternal age in third trimester   Term labor symptoms and general obstetric precautions including but not limited to vaginal bleeding, contractions, leaking of fluid and fetal movement were reviewed in detail with the patient.  I  discussed the assessment and treatment plan with the patient. The patient was provided an opportunity to ask questions and all were answered. The patient agreed with the plan and demonstrated an understanding of the instructions. The patient was advised to call back or seek an in-person office evaluation/go to MAU at [facility] for any urgent or concerning symptoms. Please refer to After Visit Summary for other counseling recommendations.   I provided 12 minutes of non-face-to-face time during this encounter.  Return in about 1 week (around 06/07/2019) for in person.  Future Appointments  Date Time Provider [place]  06/07/2019  4:00 PM Shelly Bombard, MD [place] None    Emeterio Reeve, [place] for [facility], [place]

## 2019-05-31 NOTE — Progress Notes (Signed)
I connected with  Erica Roth on 05/31/19 by a video enabled telemedicine application and verified that I am speaking with the correct person using two identifiers.   Reports no problems today.

## 2019-05-31 NOTE — Patient Instructions (Signed)

## 2019-06-07 ENCOUNTER — Other Ambulatory Visit: Payer: Self-pay

## 2019-06-07 ENCOUNTER — Ambulatory Visit (INDEPENDENT_AMBULATORY_CARE_PROVIDER_SITE_OTHER): Payer: Medicaid Other | Admitting: Advanced Practice Midwife

## 2019-06-07 ENCOUNTER — Encounter: Payer: Self-pay | Admitting: Obstetrics

## 2019-06-07 VITALS — BP 109/67 | HR 70 | Temp 98.5°F | Wt 234.5 lb

## 2019-06-07 DIAGNOSIS — O09213 Supervision of pregnancy with history of pre-term labor, third trimester: Secondary | ICD-10-CM

## 2019-06-07 DIAGNOSIS — O09899 Supervision of other high risk pregnancies, unspecified trimester: Secondary | ICD-10-CM

## 2019-06-07 DIAGNOSIS — O0993 Supervision of high risk pregnancy, unspecified, third trimester: Secondary | ICD-10-CM

## 2019-06-07 DIAGNOSIS — Z3A39 39 weeks gestation of pregnancy: Secondary | ICD-10-CM

## 2019-06-07 DIAGNOSIS — O0990 Supervision of high risk pregnancy, unspecified, unspecified trimester: Secondary | ICD-10-CM

## 2019-06-07 NOTE — Progress Notes (Signed)
Pt presents for ROB and cx check.

## 2019-06-07 NOTE — Progress Notes (Signed)
   PRENATAL VISIT NOTE  Subjective:  Erica Roth is a 38 y.o. P5F1638 at [redacted]w[redacted]d being seen today for ongoing prenatal care.  She is currently monitored for the following issues for this high-risk pregnancy and has Fibroids; History of preterm delivery, currently pregnant; Fibroid uterus; Supervision of high risk pregnancy, antepartum; and AMA (advanced maternal age) multigravida 35+ on their problem list.  Patient reports no complaints.  Contractions: Irritability. Vag. Bleeding: None.  Movement: Present. Denies leaking of fluid.   The following portions of the patient's history were reviewed and updated as appropriate: allergies, current medications, past family history, past medical history, past social history, past surgical history and problem list.   Objective:   Vitals:   06/07/19 1621  BP: 109/67  Pulse: 70  Temp: 98.5 F (36.9 C)  Weight: 234 lb 8 oz (106.4 kg)    Fetal Status: Fetal Heart Rate (bpm): 133   Movement: Present     General:  Alert, oriented and cooperative. Patient is in no acute distress.  Skin: Skin is warm and dry. No rash noted.   Cardiovascular: Normal heart rate noted  Respiratory: Normal respiratory effort, no problems with respiration noted  Abdomen: Soft, gravid, appropriate for gestational age.  Pain/Pressure: Absent     Pelvic: Cervical exam performed        Extremities: Normal range of motion.  Edema: Trace  Mental Status: Normal mood and affect. Normal behavior. Normal judgment and thought content.   Assessment and Plan:  Pregnancy: G3P1102 at [redacted]w[redacted]d 1. Supervision of high risk pregnancy, antepartum ----Anticipatory guidance about next visits/weeks of pregnancy given.  --Reviewed safety, visitor policy, and preprocedure COVID testing for IOL or scheduled C/S.  Reassurance about COVID-19 with young healthy women according to current data. Discussed possible changes to visits, including televisits, that may occur due to COVID-19.  The office  remains open if pt needs to be seen and MAU is open 24 hours/day for OB emergencies.  --Cervix closed but thin and soft.  Discussed the miles circuit to improve fetal position/encourage labor onset. --F/U in 1 week in office for NST, then IOL at 41 weeks  2. History of preterm delivery, currently pregnant  Term labor symptoms and general obstetric precautions including but not limited to vaginal bleeding, contractions, leaking of fluid and fetal movement were reviewed in detail with the patient. Please refer to After Visit Summary for other counseling recommendations.   Return in about 1 week (around 06/14/2019).  Future Appointments  Date Time Provider [place]  06/14/2019  2:30 PM Gavin Pound, CNM [facility] None    Fatima Blank, CNM

## 2019-06-07 NOTE — Patient Instructions (Signed)
Labor Precautions Reasons to come to MAU at [facility]:  1.  Contractions are  5 minutes apart or less, each last 1 minute, these have been going on for 1-2 hours, and you cannot walk or talk during them 2.  You have a large gush of fluid, or a trickle of fluid that will not stop and you have to wear a pad 3.  You have bleeding that is bright red, heavier than spotting--like menstrual bleeding (spotting can be normal in early labor or after a check of your cervix) 4.  You do not feel the baby moving like he/she normally does  

## 2019-06-08 ENCOUNTER — Telehealth (HOSPITAL_COMMUNITY): Payer: Self-pay | Admitting: *Deleted

## 2019-06-08 ENCOUNTER — Encounter (HOSPITAL_COMMUNITY): Payer: Self-pay | Admitting: *Deleted

## 2019-06-08 NOTE — Telephone Encounter (Signed)
Preadmission screen  

## 2019-06-12 ENCOUNTER — Other Ambulatory Visit: Payer: Self-pay | Admitting: Advanced Practice Midwife

## 2019-06-14 ENCOUNTER — Other Ambulatory Visit: Payer: Self-pay

## 2019-06-14 ENCOUNTER — Encounter (HOSPITAL_COMMUNITY): Payer: Self-pay | Admitting: *Deleted

## 2019-06-14 ENCOUNTER — Inpatient Hospital Stay (HOSPITAL_COMMUNITY)
Admission: AD | Admit: 2019-06-14 | Discharge: 2019-06-14 | Disposition: A | Discharge status: Home / Self Care | Payer: Medicaid Other | Attending: Obstetrics and Gynecology | Admitting: Obstetrics and Gynecology

## 2019-06-14 ENCOUNTER — Inpatient Hospital Stay (HOSPITAL_BASED_OUTPATIENT_CLINIC_OR_DEPARTMENT_OTHER): Payer: Medicaid Other

## 2019-06-14 ENCOUNTER — Ambulatory Visit (INDEPENDENT_AMBULATORY_CARE_PROVIDER_SITE_OTHER): Payer: Medicaid Other

## 2019-06-14 VITALS — BP 122/73 | HR 78 | Wt 237.0 lb

## 2019-06-14 DIAGNOSIS — O289 Unspecified abnormal findings on antenatal screening of mother: Secondary | ICD-10-CM | POA: Diagnosis not present

## 2019-06-14 DIAGNOSIS — O480 Post-term pregnancy: Secondary | ICD-10-CM

## 2019-06-14 DIAGNOSIS — O09523 Supervision of elderly multigravida, third trimester: Secondary | ICD-10-CM | POA: Diagnosis not present

## 2019-06-14 DIAGNOSIS — O0990 Supervision of high risk pregnancy, unspecified, unspecified trimester: Secondary | ICD-10-CM

## 2019-06-14 DIAGNOSIS — O322XX0 Maternal care for transverse and oblique lie, not applicable or unspecified: Secondary | ICD-10-CM | POA: Diagnosis not present

## 2019-06-14 DIAGNOSIS — Z3A40 40 weeks gestation of pregnancy: Secondary | ICD-10-CM | POA: Insufficient documentation

## 2019-06-14 DIAGNOSIS — Z3689 Encounter for other specified antenatal screening: Secondary | ICD-10-CM

## 2019-06-14 DIAGNOSIS — O288 Other abnormal findings on antenatal screening of mother: Secondary | ICD-10-CM

## 2019-06-14 DIAGNOSIS — Z79899 Other long term (current) drug therapy: Secondary | ICD-10-CM | POA: Diagnosis not present

## 2019-06-14 DIAGNOSIS — O368390 Maternal care for abnormalities of the fetal heart rate or rhythm, unspecified trimester, not applicable or unspecified: Secondary | ICD-10-CM

## 2019-06-14 DIAGNOSIS — O0993 Supervision of high risk pregnancy, unspecified, third trimester: Secondary | ICD-10-CM

## 2019-06-14 NOTE — Progress Notes (Signed)
   PRENATAL VISIT NOTE  Subjective:  Erica Roth is a 38 y.o. O2H4765 at [redacted]w[redacted]d who presents today for routine prenatal care.  She is currently being monitored for supervision of a high-risk pregnancy with problems as listed below.  Patient has no pregnancy related concerns and endorses fetal movement.  She denies vaginal concerns including discharge, bleeding, leaking, itching, and burning.   Patient Active Problem List   Diagnosis Date Noted  . Supervision of high risk pregnancy, antepartum 12/26/2018  . AMA (advanced maternal age) multigravida 35+ 12/26/2018  . Fibroid uterus 12/29/2016  . History of preterm delivery, currently pregnant 11/02/2016  . Fibroids 10/26/2015    The following portions of the patient's history were reviewed and updated as appropriate: allergies, current medications, past family history, past medical history, past social history, past surgical history and problem list. Problem list updated.  Objective:   Vitals:   06/14/19 1531  BP: 122/73  Pulse: 78  Weight: 237 lb (107.5 kg)    Fetal Status: Fetal Heart Rate (bpm): NST Fundal Height: 38 cm Movement: Present  Presentation: Undeterminable  General:  Alert, oriented and cooperative. Patient is in no acute distress.  Skin: Skin is warm and dry.   Cardiovascular: Regular rate and rhythm.  Respiratory: Normal respiratory effort. CTA-Bilaterally  Abdomen: Soft, gravid, appropriate for gestational age.  Pelvic: Cervical exam performed Dilation: Closed      Extremities: Normal range of motion.  Edema: Trace  Mental Status: Normal mood and affect. Normal behavior. Normal judgment and thought content.   Assessment and Plan:  Pregnancy: G3P1102 at [redacted]w[redacted]d  1. Supervision of high risk pregnancy, antepartum -Discussed NST findings and need for further evaluation -Discussed cervical exam and inability to identify presenting part. -Patient informed that induction could occur tonight if evaluation  unsatisfactory  2. Post-term pregnancy, 40-42 weeks of gestation -Placed on NST for 40 minutes. -Scheduled for IOL next Tuesday at 0730 am  3. NST (non-stress test) nonreactive FHR: 150 bpm, Mod Var, +Variable Decels, -Accels Toco: No Ctx Noted -Cat II FT -Instructed to report to MAU for evaluation immediately. Rosine Abe, CNM contacted and informed of patient status and need for evaluation.  Further informed of undeterminable presenting part and need for BSUS.  Term labor symptoms and general obstetric precautions including but not limited to vaginal bleeding, contractions, leaking of fluid and fetal movement were reviewed with the patient.  Please refer to After Visit Summary for other counseling recommendations.  Return for Postpartum.  Future Appointments  Date Time Provider [place]  06/16/2019  8:20 AM [facility] 1 [facility] None  06/20/2019  7:30 AM [facility] [place] None    Maryann Conners, CNM 06/14/2019, 4:38 PM

## 2019-06-14 NOTE — MAU Provider Note (Signed)
History     CSN: [id number]  Arrival date and time: 06/14/19 1744   First Provider Initiated Contact with Patient 06/14/19 1804     Chief Complaint  Patient presents with  . non reactive NST  . DCELS   HPI Milaina Sher is a 38 y.o. [id number] at [redacted]w[redacted]d who presents to MAU from clinic for evaluation following non-reactive NST and concern for transverse lie. Patient denies contraction pain, vaginal bleeding, leaking of fluid, decreased fetal movement, fever, falls, or recent illness.   OB History    Gravida  3   Para  2   Term  1   Preterm  1   AB      Living  2     SAB      TAB      Ectopic      Multiple  0   Live Births  2           Past Medical History:  Diagnosis Date  . Fibroid   . Preterm labor     Past Surgical History:  Procedure Laterality Date  . NO PAST SURGERIES      Family History  Problem Relation Age of Onset  . Hypertension Mother     Social History   Tobacco Use  . Smoking status: Never Smoker  . Smokeless tobacco: Never Used  Substance Use Topics  . Alcohol use: No  . Drug use: No    Allergies: No Known Allergies  Medications Prior to Admission  Medication Sig Dispense Refill Last Dose  . Blood Pressure Monitor DEVI 1 each by Does not apply route once a week. Check Blood Pressure weekly, Large cuff 1 Device 0 06/14/2019 at Unknown time  . pantoprazole (PROTONIX) 40 MG tablet Take 1 tablet (40 mg total) by mouth daily. 30 tablet 3 06/14/2019 at Unknown time  . Prenat-FeAsp-Meth-FA-DHA w/o A (PRENATE PIXIE) 10-0.6-0.4-200 MG CAPS Take 1 tablet by mouth daily. 30 capsule 12 06/14/2019 at Unknown time  . Prenat-FeAsp-Meth-FA-DHA w/o A (PRENATE PIXIE) 10-0.6-0.4-200 MG CAPS Take 1 tablet by mouth daily. 30 capsule 12     Review of Systems  Constitutional: Negative for chills, fatigue and fever.  Respiratory: Negative for shortness of breath.   Gastrointestinal: Negative for abdominal pain, nausea and vomiting.  Genitourinary:  Negative for vaginal bleeding, vaginal discharge and vaginal pain.  Neurological: Negative for headaches.  All other systems reviewed and are negative.  Physical Exam   Blood pressure 137/68, pulse 74, temperature 98.3 F (36.8 C), resp. rate 16, last menstrual period 09/06/2018, unknown if currently breastfeeding.  Physical Exam  Nursing note and vitals reviewed. Constitutional: She is oriented to person, place, and time. She appears well-developed and well-nourished.  Cardiovascular: Normal rate.  Respiratory: Effort normal.  GI:  Gravid  Neurological: She is alert and oriented to person, place, and time.  Skin: Skin is warm and dry.  Psychiatric: She has a normal mood and affect. Her behavior is normal. Judgment and thought content normal.    MAU Course/MDM  Procedures  --Phone report received from Milinda Cave, CNM prior to patient arrival. Discussed questionable variable decels and non reactive NST in clinic. Patient's cervix was checked prior to arrival: closed/thick --Elevated BP x 1 upon arrival in MAU. No history of elevated BPs, no severe signs or symptoms --Transverse lie confirmed with bedside ultrasound. Fetal head visualized at 5 o clock, fetal spine superior to fetal abdomen, not well engaged --Chief complaint, PE, tracing and [place] results discussed  with Dr. Elly Modena at [phone].  --Reactive tracing: baseline 135, moderate variability, positive accels, no decels  --BPP 8/8  Patient Vitals for the past 24 hrs:  BP Temp Pulse Resp  06/14/19 1937 124/63 - 72 18  06/14/19 1930 124/63 - 72 -  06/14/19 1805 137/68 - 74 -  06/14/19 1758 (!) 144/80 98.3 F (36.8 C) 84 16    Assessment and Plan  --38 y.o. G3P1102 at [redacted]w[redacted]d  --Reactive tracing, BPP 8/8 --Fetal head a 5 o clock, not well engaged. Referred to side-lying release position and [employer] [place] to facilitate optimal positioning  --Pt declined SVE prior to discharge, closed in office this afternoon --Per consult with  Dr. Elly Modena patient discharged home in stable condition with labor precautions. Pt verbalizes understanding that if she thinks her water has broken she should immediately report to MAU.  F/U: IOL scheduled for 06/20/19  Darlina Rumpf, CNM 06/14/2019, 7:56 PM

## 2019-06-14 NOTE — MAU Note (Signed)
.   Erica Roth is a 38 y.o. at [redacted]w[redacted]d here in MAU after appointment for non reactive NST and dcels. PT denies any pain , VB or LOF. +FM   Pain score: 0 Vitals:   06/14/19 1758  BP: (!) 144/80  Pulse: 84  Resp: 16  Temp: 98.3 F (36.8 C)     FHT:145 Lab orders placed from triage:

## 2019-06-14 NOTE — Discharge Instructions (Signed)

## 2019-06-16 ENCOUNTER — Other Ambulatory Visit (HOSPITAL_COMMUNITY)
Admission: RE | Admit: 2019-06-16 | Discharge: 2019-06-16 | Disposition: A | Discharge status: Home / Self Care | Payer: Medicaid Other | Source: Ambulatory Visit | Attending: Family Medicine | Admitting: Family Medicine

## 2019-06-20 ENCOUNTER — Inpatient Hospital Stay (HOSPITAL_COMMUNITY): Payer: Medicaid Other

## 2019-06-20 ENCOUNTER — Encounter (HOSPITAL_COMMUNITY): Payer: Self-pay | Admitting: General Practice

## 2019-06-20 ENCOUNTER — Other Ambulatory Visit: Payer: Self-pay

## 2019-06-20 ENCOUNTER — Inpatient Hospital Stay (HOSPITAL_COMMUNITY)
Admission: AD | Admit: 2019-06-20 | Discharge: 2019-06-22 | DRG: 807 | Disposition: A | Discharge status: Home / Self Care | Payer: Medicaid Other | Attending: Family Medicine | Admitting: Family Medicine

## 2019-06-20 DIAGNOSIS — D259 Leiomyoma of uterus, unspecified: Secondary | ICD-10-CM | POA: Diagnosis present

## 2019-06-20 DIAGNOSIS — O3413 Maternal care for benign tumor of corpus uteri, third trimester: Secondary | ICD-10-CM | POA: Diagnosis present

## 2019-06-20 DIAGNOSIS — O0990 Supervision of high risk pregnancy, unspecified, unspecified trimester: Secondary | ICD-10-CM

## 2019-06-20 DIAGNOSIS — O09523 Supervision of elderly multigravida, third trimester: Secondary | ICD-10-CM

## 2019-06-20 DIAGNOSIS — Z1159 Encounter for screening for other viral diseases: Secondary | ICD-10-CM | POA: Diagnosis not present

## 2019-06-20 DIAGNOSIS — Z3A41 41 weeks gestation of pregnancy: Secondary | ICD-10-CM

## 2019-06-20 DIAGNOSIS — O480 Post-term pregnancy: Principal | ICD-10-CM | POA: Diagnosis present

## 2019-06-20 LAB — CBC
HCT: 34 % — ABNORMAL LOW (ref 36.0–46.0)
Hemoglobin: 11.3 g/dL — ABNORMAL LOW (ref 12.0–15.0)
MCH: 28.3 pg (ref 26.0–34.0)
MCHC: 33.2 g/dL (ref 30.0–36.0)
MCV: 85 fL (ref 80.0–100.0)
Platelets: 205 10*3/uL (ref 150–400)
RBC: 4 MIL/uL (ref 3.87–5.11)
RDW: 14.9 % (ref 11.5–15.5)
WBC: 8.3 10*3/uL (ref 4.0–10.5)
nRBC: 0 % (ref 0.0–0.2)

## 2019-06-20 LAB — TYPE AND SCREEN
ABO/RH(D): O POS
Antibody Screen: NEGATIVE

## 2019-06-20 LAB — SARS CORONAVIRUS 2 BY RT PCR (HOSPITAL ORDER, PERFORMED IN ~~LOC~~ HOSPITAL LAB): SARS Coronavirus 2: NEGATIVE

## 2019-06-20 MED ORDER — ACETAMINOPHEN 325 MG PO TABS: 650.0000 mg | ORAL_TABLET | ORAL | Status: DC | PRN

## 2019-06-20 MED ORDER — OXYTOCIN BOLUS FROM INFUSION
500.0000 mL | Freq: Once | INTRAVENOUS | Status: AC
  Administered 2019-06-20: 500 mL via INTRAVENOUS

## 2019-06-20 MED ORDER — SOD CITRATE-CITRIC ACID 500-334 MG/5ML PO SOLN: 30.0000 mL | ORAL | Status: DC | PRN

## 2019-06-20 MED ORDER — OXYCODONE-ACETAMINOPHEN 5-325 MG PO TABS: 2.0000 | ORAL_TABLET | ORAL | Status: DC | PRN

## 2019-06-20 MED ORDER — OXYCODONE-ACETAMINOPHEN 5-325 MG PO TABS: 1.0000 | ORAL_TABLET | ORAL | Status: DC | PRN

## 2019-06-20 MED ORDER — ONDANSETRON HCL 4 MG/2ML IJ SOLN: 4.0000 mg | INTRAMUSCULAR | Status: DC | PRN

## 2019-06-20 MED ORDER — ZOLPIDEM TARTRATE 5 MG PO TABS: 5.0000 mg | ORAL_TABLET | Freq: Every evening | ORAL | Status: DC | PRN

## 2019-06-20 MED ORDER — TERBUTALINE SULFATE 1 MG/ML IJ SOLN
0.2500 mg | Freq: Once | INTRAMUSCULAR | Status: DC | PRN
  Filled 2019-06-20: qty 1

## 2019-06-20 MED ORDER — DIPHENHYDRAMINE HCL 25 MG PO CAPS: 25.0000 mg | ORAL_CAPSULE | Freq: Four times a day (QID) | ORAL | Status: DC | PRN

## 2019-06-20 MED ORDER — MISOPROSTOL 50MCG HALF TABLET
50.0000 ug | ORAL_TABLET | ORAL | Status: DC | PRN
  Administered 2019-06-20 (×2): 50 ug via BUCCAL
  Filled 2019-06-20 (×2): qty 1

## 2019-06-20 MED ORDER — LACTATED RINGERS IV SOLN
500.0000 mL | INTRAVENOUS | Status: DC | PRN
  Administered 2019-06-20: 500 mL via INTRAVENOUS

## 2019-06-20 MED ORDER — SENNOSIDES-DOCUSATE SODIUM 8.6-50 MG PO TABS
2.0000 | ORAL_TABLET | ORAL | Status: DC
  Administered 2019-06-20 – 2019-06-21 (×2): 2 via ORAL
  Filled 2019-06-20 (×2): qty 2

## 2019-06-20 MED ORDER — WITCH HAZEL-GLYCERIN EX PADS: 1.0000 "application " | MEDICATED_PAD | CUTANEOUS | Status: DC | PRN

## 2019-06-20 MED ORDER — MISOPROSTOL 25 MCG QUARTER TABLET: 25.0000 ug | ORAL_TABLET | ORAL | Status: DC | PRN

## 2019-06-20 MED ORDER — TETANUS-DIPHTH-ACELL PERTUSSIS 5-2.5-18.5 LF-MCG/0.5 IM SUSP: 0.5000 mL | Freq: Once | INTRAMUSCULAR | Status: DC

## 2019-06-20 MED ORDER — LACTATED RINGERS IV SOLN
INTRAVENOUS | Status: DC
  Administered 2019-06-20 (×2): via INTRAVENOUS

## 2019-06-20 MED ORDER — COCONUT OIL OIL: 1.0000 "application " | TOPICAL_OIL | Status: DC | PRN

## 2019-06-20 MED ORDER — PRENATAL MULTIVITAMIN CH
1.0000 | ORAL_TABLET | Freq: Every day | ORAL | Status: DC
  Administered 2019-06-21 – 2019-06-22 (×2): 1 via ORAL
  Filled 2019-06-20 (×2): qty 1

## 2019-06-20 MED ORDER — DIBUCAINE (PERIANAL) 1 % EX OINT: 1.0000 "application " | TOPICAL_OINTMENT | CUTANEOUS | Status: DC | PRN

## 2019-06-20 MED ORDER — BENZOCAINE-MENTHOL 20-0.5 % EX AERO: 1.0000 "application " | INHALATION_SPRAY | CUTANEOUS | Status: DC | PRN

## 2019-06-20 MED ORDER — FENTANYL CITRATE (PF) 100 MCG/2ML IJ SOLN
50.0000 ug | INTRAMUSCULAR | Status: DC | PRN
  Filled 2019-06-20: qty 2

## 2019-06-20 MED ORDER — OXYTOCIN 40 UNITS IN NORMAL SALINE INFUSION - SIMPLE MED
2.5000 [IU]/h | INTRAVENOUS | Status: DC
  Administered 2019-06-20: 21:00:00 2.5 [IU]/h via INTRAVENOUS

## 2019-06-20 MED ORDER — ONDANSETRON HCL 4 MG PO TABS: 4.0000 mg | ORAL_TABLET | ORAL | Status: DC | PRN

## 2019-06-20 MED ORDER — PHENYLEPHRINE 40 MCG/ML (10ML) SYRINGE FOR IV PUSH (FOR BLOOD PRESSURE SUPPORT)
PREFILLED_SYRINGE | INTRAVENOUS | Status: AC
  Filled 2019-06-20: qty 10

## 2019-06-20 MED ORDER — FENTANYL-BUPIVACAINE-NACL 0.5-0.125-0.9 MG/250ML-% EP SOLN
EPIDURAL | Status: AC
  Filled 2019-06-20: qty 250

## 2019-06-20 MED ORDER — LIDOCAINE HCL (PF) 1 % IJ SOLN
30.0000 mL | INTRAMUSCULAR | Status: AC | PRN
  Administered 2019-06-20: 30 mL via SUBCUTANEOUS
  Filled 2019-06-20: qty 30

## 2019-06-20 MED ORDER — OXYTOCIN 40 UNITS IN NORMAL SALINE INFUSION - SIMPLE MED
INTRAVENOUS | Status: AC
  Administered 2019-06-20: 500 mL via INTRAVENOUS
  Filled 2019-06-20: qty 1000

## 2019-06-20 MED ORDER — IBUPROFEN 600 MG PO TABS
600.0000 mg | ORAL_TABLET | Freq: Four times a day (QID) | ORAL | Status: DC
  Administered 2019-06-20 – 2019-06-22 (×6): 600 mg via ORAL
  Filled 2019-06-20 (×7): qty 1

## 2019-06-20 MED ORDER — SIMETHICONE 80 MG PO CHEW: 80.0000 mg | CHEWABLE_TABLET | ORAL | Status: DC | PRN

## 2019-06-20 MED ORDER — ONDANSETRON HCL 4 MG/2ML IJ SOLN: 4.0000 mg | Freq: Four times a day (QID) | INTRAMUSCULAR | Status: DC | PRN

## 2019-06-20 NOTE — Progress Notes (Signed)
OB/GYN Faculty Practice: Labor Progress Note  Subjective: Patient doing well without complaints.  Objective: BP 104/61   Pulse 92   Temp 98 F (36.7 C) (Oral)   Resp 16   Ht 5\' 8"  (1.727 m)   Wt 106.6 kg   LMP 09/06/2018   BMI 35.73 kg/m  Gen: well appearing, NAD Dilation: Fingertip Effacement (%): 50 Cervical Position: Posterior Station: -3 Presentation: Vertex Exam by:: Dr. Juleen China   FHT: baseline 150bmp, mod variability, +accels, no decels Toco: ctx q 2-3 minutes  Assessment and Plan: 38 y.o. [id number] [redacted]w[redacted]d IOL-post dates  Labor:  --cytotec x1 @12pm , will give another cytotec now --consider FB and pitocin as able based on cervical exams -- pain control: pt undecided about epidural  Fetal Well-Being: EFW 257g (40%ile). -- Category 1 - continuous fetal monitoring -- GBS negative   Corliss Blacker, PGY-III Family Medicine 5:55 PM

## 2019-06-20 NOTE — MAU Note (Signed)
Covid swab collected.PT tolerated well.PT asymptomatic

## 2019-06-20 NOTE — Progress Notes (Signed)
Patient denies need for use of an interpreter, states she fully understands and can speak [place].

## 2019-06-20 NOTE — H&P (Signed)
LABOR AND DELIVERY ADMISSION HISTORY AND PHYSICAL NOTE  Erica Roth is a 38 y.o. female [id number] with IUP at [redacted]w[redacted]d by LMP c/w second trimester sono presenting for IOL for postdates.  She reports positive fetal movement. She denies leakage of fluid or vaginal bleeding.  Prenatal History/Complications: PNC at [facility]  Pregnancy complications:  - AMA  -h/o preterm delivery at 68 weeks with first baby due to PPROM  -fibroid uterus, 6.6 cm lower uterine segment fibroid   Past Medical History: Past Medical History:  Diagnosis Date  . Fibroid   . Preterm labor     Past Surgical History: Past Surgical History:  Procedure Laterality Date  . NO PAST SURGERIES      Obstetrical History: OB History    Gravida  3   Para  2   Term  1   Preterm  1   AB      Living  2     SAB      TAB      Ectopic      Multiple  0   Live Births  2           Social History: Social History   Socioeconomic History  . Marital status: Married    Spouse name: Not on file  . Number of children: Not on file  . Years of education: Not on file  . Highest education level: Not on file  Occupational History  . Not on file  Social Needs  . Financial resource strain: Not on file  . Food insecurity    Worry: Not on file    Inability: Not on file  . Transportation needs    Medical: Not on file    Non-medical: Not on file  Tobacco Use  . Smoking status: Never Smoker  . Smokeless tobacco: Never Used  Substance and Sexual Activity  . Alcohol use: No  . Drug use: No  . Sexual activity: Yes    Birth control/protection: None  Lifestyle  . Physical activity    Days per week: Not on file    Minutes per session: Not on file  . Stress: Not on file  Relationships  . Social Herbalist on phone: Not on file    Gets together: Not on file    Attends religious service: Not on file    Active member of club or organization: Not on file    Attends meetings of clubs or organizations:  Not on file    Relationship status: Not on file  Other Topics Concern  . Not on file  Social History Narrative  . Not on file    Family History: Family History  Problem Relation Age of Onset  . Hypertension Mother     Allergies: No Known Allergies  Medications Prior to Admission  Medication Sig Dispense Refill Last Dose  . pantoprazole (PROTONIX) 40 MG tablet Take 1 tablet (40 mg total) by mouth daily. 30 tablet 3 Past Week at Unknown time  . Prenat-FeAsp-Meth-FA-DHA w/o A (PRENATE PIXIE) 10-0.6-0.4-200 MG CAPS Take 1 tablet by mouth daily. 30 capsule 12 06/19/2019 at Unknown time  . Blood Pressure Monitor DEVI 1 each by Does not apply route once a week. Check Blood Pressure weekly, Large cuff 1 Device 0      Review of Systems  All systems reviewed and negative except as stated in HPI  Physical Exam Blood pressure 125/74, pulse 90, temperature 98.3 F (36.8 C), temperature source Oral, resp. rate  18, height 5\' 8"  (1.727 m), weight 106.6 kg, last menstrual period 09/06/2018, unknown if currently breastfeeding. General appearance: alert, oriented, NAD Lungs: normal respiratory effort Heart: regular rate Abdomen: soft, non-tender; gravid, FH appropriate for GA Extremities: No calf swelling or tenderness Presentation: cephalic, confirmed by bedside sono  Fetal monitoring: 135 bpm, moderate variability, +acels, occ variable decels  Uterine activity: q1-4 min  Dilation: Closed Effacement (%): Thick Station: Ballotable Exam by:: Dr. Sylvester Harder   Prenatal labs: ABO, Rh: --/--/O POS (07/28 1105) Antibody: PENDING (07/28 1105) Rubella: 8.35 (02/03 1614) RPR: Non Reactive (05/04 0930)  HBsAg: Negative (02/03 1614)  HIV: Non Reactive (05/04 0930)  GC/Chlamydia: Negative  GBS: Negative (07/01 0357)  2-hr GTT: Normal  Genetic screening:  Declined  Anatomy US: Normal   Prenatal Transfer Tool  Maternal Diabetes: No Genetic Screening: Declined Maternal Ultrasounds/Referrals:  Normal Fetal Ultrasounds or other Referrals:  None Maternal Substance Abuse:  No Significant Maternal Medications:  None Significant Maternal Lab Results: Group B Strep negative  Results for orders placed or performed during the hospital encounter of 06/20/19 (from the past 24 hour(s))  Type and screen [place]   Collection Time: 06/20/19 11:05 AM  Result Value Ref Range   ABO/RH(D) O POS    Antibody Screen PENDING    Sample Expiration      06/23/2019,2359 Performed at [facility], [place] [address] [place], [place] [address]   CBC   Collection Time: 06/20/19 11:08 AM  Result Value Ref Range   WBC 8.3 4.0 - 10.5 K/uL   RBC 4.00 3.87 - 5.11 MIL/uL   Hemoglobin 11.3 (L) 12.0 - 15.0 g/dL   HCT 34.0 (L) 36.0 - 46.0 %   MCV 85.0 80.0 - 100.0 fL   MCH 28.3 26.0 - 34.0 pg   MCHC 33.2 30.0 - 36.0 g/dL   RDW 14.9 11.5 - 15.5 %   Platelets 205 150 - 400 K/uL   nRBC 0.0 0.0 - 0.2 %    Patient Active Problem List   Diagnosis Date Noted  . Post term pregnancy over 40 weeks 06/20/2019  . Supervision of high risk pregnancy, antepartum 12/26/2018  . AMA (advanced maternal age) multigravida 35+ 12/26/2018  . Fibroid uterus 12/29/2016  . History of preterm delivery, currently pregnant 11/02/2016  . Fibroids 10/26/2015    Assessment: Erica Roth is a 38 y.o. [id number] at [redacted]w[redacted]d here for IOL for postdates.   #Labor: Induction. Cervix is not favorable. Will start with Cytotec 50 mg buccal.  #Pain: Unsure if wants an epidural.  #FWB: Cat II but overall very reassuring with quick return to baseline, moderate variability throughout and acels.  #ID:  GBS neg  #MOF: Both  #MOC:Declines  #Circ:  N/A   Melina Schools 06/20/2019, 11:51 AM

## 2019-06-21 LAB — CBC
HCT: 34.1 % — ABNORMAL LOW (ref 36.0–46.0)
Hemoglobin: 10.9 g/dL — ABNORMAL LOW (ref 12.0–15.0)
MCH: 27.8 pg (ref 26.0–34.0)
MCHC: 32 g/dL (ref 30.0–36.0)
MCV: 87 fL (ref 80.0–100.0)
Platelets: 192 10*3/uL (ref 150–400)
RBC: 3.92 MIL/uL (ref 3.87–5.11)
RDW: 15.3 % (ref 11.5–15.5)
WBC: 10.3 10*3/uL (ref 4.0–10.5)
nRBC: 0 % (ref 0.0–0.2)

## 2019-06-21 LAB — NOVEL CORONAVIRUS, NAA (HOSP ORDER, SEND-OUT TO REF LAB; TAT 18-24 HRS): SARS-CoV-2, NAA: NOT DETECTED

## 2019-06-21 LAB — RPR: RPR Ser Ql: NONREACTIVE

## 2019-06-21 NOTE — Lactation Note (Signed)
This note was copied from a baby's chart. Lactation Consultation Note  Patient Name: Erica Roth FIEPP'I Date: 06/21/2019 Reason for consult: Initial assessment;Term  [employer] interpreter services for [place]: Interpreter Mohamed # [phone]  Dad declined interpreter services for [place], and told interpreter (in [place]) that they didn't need interpreter services, interpreter confirmed what dad said. Both, mom and dad speak [place].  27 hours old FT female who is still being exclusively BF by her mother, she's a P3 and has some experience BF. She didn't BF her first child but BF her second one for 3 months, she did both, breast and formula. Baby is at 2% weight loss and per mom BF is going well, he's been able to latch so far. She's also familiar with hand expression and able to see colostrum when doing so. Mom has a hand pump at home, she didn't participate in the [facility] program for this pregnancy, but she did during her previous pregnancy.   Offered assistance with latch but mom politely declined, she said baby already fed about an hour ago. Asked mom to call for assistance when needed. Reviewed normal newborn behavior and feeding cues. Mom came as breast/formula and she'll request some if needed. Mother also educated on size of newborn stomach and baby's typical feeding pattern the first 24 hours.  Feeding plan:  1. Encouraged mom to feed baby STS 8-12 times/24 or sooner if feeding cues are present 2. Hand expression and spoon feeding were also encouraged  BF brochure, BF resources and feeding diary were reviewed. Parents reported all questions and concerns were answered, they're both aware of [facility] services and will call PRN.  Maternal Data Formula Feeding for Exclusion: Yes Reason for exclusion: Mother's choice to formula and breast feed on admission Has patient been taught Hand Expression?: No Does the patient have breastfeeding experience prior to this delivery?:  Yes  Feeding Feeding Type: Breast Milk  LATCH Score Latch: Repeated attempts needed to sustain latch, nipple held in mouth throughout feeding, stimulation needed to elicit sucking reflex.  Audible Swallowing: A few with stimulation  Type of Nipple: Everted at rest and after stimulation  Comfort (Breast/Nipple): Soft / non-tender  Hold (Positioning): No assistance needed to correctly position infant at breast.  LATCH Score: 8  Interventions Interventions: Breast feeding basics reviewed  Lactation Tools Discussed/Used WIC Program: No   Consult Status Consult Status: PRN Follow-up type: In-patient    Erica Roth 06/21/2019, 4:22 PM

## 2019-06-21 NOTE — Progress Notes (Signed)
Post Partum Day 1 Subjective: no complaints, up ad lib, voiding and tolerating PO  Objective: Blood pressure 118/62, pulse 72, temperature 98.1 F (36.7 C), temperature source Oral, resp. rate 16, height 5\' 8"  (1.727 m), weight 106.6 kg, last menstrual period 09/06/2018, SpO2 100 %, unknown if currently breastfeeding.  Physical Exam:  General: alert, cooperative and no distress Lochia: appropriate Uterine Fundus: firm Incision: n/a DVT Evaluation: No evidence of DVT seen on physical exam.  Recent Labs    06/20/19 1108 06/21/19 0539  HGB 11.3* 10.9*  HCT 34.0* 34.1*    Assessment/Plan: Plan for discharge tomorrow, Breastfeeding and Lactation consult   LOS: 1 day   Erica Roth 06/21/2019, 8:13 AM

## 2019-06-22 MED ORDER — SENNOSIDES-DOCUSATE SODIUM 8.6-50 MG PO TABS: 2.0000 | ORAL_TABLET | Freq: Every evening | ORAL | Status: DC | PRN

## 2019-06-22 MED ORDER — IBUPROFEN 800 MG PO TABS: 800.0000 mg | ORAL_TABLET | Freq: Three times a day (TID) | ORAL | 0 refills | Status: DC

## 2019-06-22 NOTE — Lactation Note (Signed)
This note was copied from a baby's chart. Lactation Consultation Note  Patient Name: Girl Areil Ottey NMMHW'K Date: 06/22/2019 Reason for consult: Follow-up assessment Baby is 36 hours/6% weight loss.  Mom states baby is latching easily and feeding well.  Reviewed waking techniques and breast massage.  Discussed milk coming to volume and the prevention and treatment of engorgement.  Mom has a manual pump at home.  Instructed to continue to feed with cues.  Reviewed lactation outpatient services and encouraged to call prn.  Maternal Data    Feeding Feeding Type: Breast Fed  LATCH Score                   Interventions    Lactation Tools Discussed/Used     Consult Status Consult Status: Complete Follow-up type: Call as needed    Lathen Seal S 06/22/2019, 9:22 AM

## 2019-06-22 NOTE — Discharge Summary (Addendum)
Obstetrics Discharge Summary OB/GYN Faculty Practice   Patient Name: Erica Roth DOB: [birth year] MRN: [id number]  Date of admission: 06/20/2019 Delivering MD: Truett Mainland   Date of discharge: 06/22/2019  Admitting diagnosis: pregnancy Intrauterine pregnancy: [redacted]w[redacted]d     Secondary diagnosis:   Active Problems:   Post term pregnancy over 40 weeks  Additional problems:  none     Discharge diagnosis: Term Pregnancy Delivered                                            Postpartum procedures: None Complications: none  Outpatient Follow-Up: [ ]  4 week post partum visit [ ]  discuss contraception at post partum visit  Hospital course: Erica Roth is a 38 y.o. [redacted]w[redacted]d who was admitted for IOL-post dates. Her pregnancy was uncomplicated. Her labor course was also uncomplicated. Delivery was uncomplicated. Please see delivery/op note for additional details. She was breastfeeding without difficulty. By day of discharge, she was passing flatus, urinating, eating and drinking without difficulty. Her pain was well-controlled, and she was discharged home with ibuprofen and tylenol. She will follow-up in clinic in 4 weeks.   Physical exam  Vitals:   06/21/19 0916 06/21/19 1727 06/21/19 2226 06/22/19 0447  BP: 115/67 125/76 114/61 114/72  Pulse:  67 (!) 58 63  Resp: 18 20 19 18   Temp: 98 F (36.7 C) 98.3 F (36.8 C) 97.9 F (36.6 C) 98 F (36.7 C)  TempSrc: Oral Oral Oral Oral  SpO2:   100% 100%  Weight:      Height:       General: well appearing, NAD Lochia: appropriate Uterine Fundus: firm Incision: N/A DVT Evaluation: No evidence of DVT seen on physical exam. No cords or calf tenderness. No significant calf/ankle edema.  Labs: Lab Results  Component Value Date   WBC 10.3 06/21/2019   HGB 10.9 (L) 06/21/2019   HCT 34.1 (L) 06/21/2019   MCV 87.0 06/21/2019   PLT 192 06/21/2019   CMP Latest Ref Rng & Units 01/20/2016  Glucose mg/dL 90  BUN 6 - 20 mg/dL -  Creatinine  0.44 - 1.00 mg/dL -  Sodium 135 - 145 mmol/L -  Potassium 3.5 - 5.1 mmol/L -  Chloride 101 - 111 mmol/L -  CO2 22 - 32 mmol/L -  Calcium 8.9 - 10.3 mg/dL -  Total Protein 6.5 - 8.1 g/dL -  Total Bilirubin 0.3 - 1.2 mg/dL -  Alkaline Phos 38 - 126 U/L -  AST 15 - 41 U/L -  ALT 14 - 54 U/L -    Discharge instructions: Per After Visit Summary and "Baby and Me Booklet"  After visit meds:  Allergies as of 06/22/2019   No Known Allergies     Medication List    TAKE these medications   Blood Pressure Monitor Devi 1 each by Does not apply route once a week. Check Blood Pressure weekly, Large cuff   ibuprofen 800 MG tablet Commonly known as: ADVIL Take 1 tablet (800 mg total) by mouth 3 (three) times daily.   pantoprazole 40 MG tablet Commonly known as: Protonix Take 1 tablet (40 mg total) by mouth daily.   Prenate Pixie 10-0.6-0.4-200 MG Caps Take 1 tablet by mouth daily.   senna-docusate 8.6-50 MG tablet Commonly known as: Senokot-S Take 2 tablets by mouth at bedtime as needed for mild constipation.  Postpartum contraception: patient is undecided at this time  Diet: Routine Diet Activity: Advance as tolerated. Pelvic rest for 6 weeks.   Follow-up Appt:No future appointments. Follow-up Visit:No follow-ups on file.  Newborn Data: Live born female  Birth Weight: 7 lb 10 oz (3459 g) APGAR: 73, 9  Newborn Delivery   Birth date/time: 06/20/2019 20:35:00 Delivery type: Vaginal, Spontaneous      Baby Feeding: Breast Disposition:home with mother   Corliss Blacker, PGY-III Family Medicine  Attestation: I have seen this patient and agree with the resident's documentation. I have examined them separately, and we have discussed the plan of care.  Lambert Mody. Juleen China, DO OB/GYN Fellow

## 2019-06-23 ENCOUNTER — Ambulatory Visit: Payer: Self-pay

## 2019-06-23 NOTE — Lactation Note (Signed)
This note was copied from a baby's chart. Lactation Consultation Note  Patient Name: Erica Roth OMBTD'H Date: 06/23/2019 Reason for consult: Follow-up assessment;Term Baby is 60 hours old/7% weight loss.  Mom states baby continues to cry after breastfeeding so she gave formula this morning.  She states she did not have a good milk supply with her first baby and supplemented with formula.  Encouraged to put baby to breast often and always prior to any supplementation.  Reviewed engorgement treatment yesterday.  Encouraged to call prn.  Maternal Data    Feeding Feeding Type: Bottle Fed - Formula Nipple Type: Slow - flow  LATCH Score                   Interventions    Lactation Tools Discussed/Used     Consult Status Consult Status: Complete Follow-up type: Call as needed    Ave Filter 06/23/2019, 9:22 AM

## 2019-07-20 ENCOUNTER — Ambulatory Visit: Payer: Medicaid Other | Admitting: Obstetrics and Gynecology

## 2019-08-15 ENCOUNTER — Ambulatory Visit: Payer: Medicaid Other | Admitting: Obstetrics & Gynecology

## 2019-09-19 ENCOUNTER — Telehealth: Payer: Self-pay | Admitting: Obstetrics & Gynecology

## 2020-11-16 ENCOUNTER — Other Ambulatory Visit: Payer: Self-pay

## 2020-11-16 ENCOUNTER — Inpatient Hospital Stay (HOSPITAL_COMMUNITY)
Admission: AD | Admit: 2020-11-16 | Discharge: 2020-11-16 | Disposition: A | Discharge status: Home / Self Care | Payer: Medicaid Other | Attending: Obstetrics and Gynecology | Admitting: Obstetrics and Gynecology

## 2020-11-16 ENCOUNTER — Inpatient Hospital Stay (HOSPITAL_COMMUNITY): Payer: Medicaid Other

## 2020-11-16 ENCOUNTER — Encounter (HOSPITAL_COMMUNITY): Payer: Self-pay | Admitting: Obstetrics and Gynecology

## 2020-11-16 DIAGNOSIS — Z3A01 Less than 8 weeks gestation of pregnancy: Secondary | ICD-10-CM | POA: Diagnosis not present

## 2020-11-16 DIAGNOSIS — O209 Hemorrhage in early pregnancy, unspecified: Secondary | ICD-10-CM

## 2020-11-16 DIAGNOSIS — O3411 Maternal care for benign tumor of corpus uteri, first trimester: Secondary | ICD-10-CM | POA: Insufficient documentation

## 2020-11-16 DIAGNOSIS — O200 Threatened abortion: Secondary | ICD-10-CM | POA: Diagnosis present

## 2020-11-16 LAB — CBC
HCT: 35.9 % — ABNORMAL LOW (ref 36.0–46.0)
Hemoglobin: 12.2 g/dL (ref 12.0–15.0)
MCH: 29.3 pg (ref 26.0–34.0)
MCHC: 34 g/dL (ref 30.0–36.0)
MCV: 86.3 fL (ref 80.0–100.0)
Platelets: 242 10*3/uL (ref 150–400)
RBC: 4.16 MIL/uL (ref 3.87–5.11)
RDW: 14.2 % (ref 11.5–15.5)
WBC: 9.9 10*3/uL (ref 4.0–10.5)
nRBC: 0 % (ref 0.0–0.2)

## 2020-11-16 LAB — URINALYSIS, ROUTINE W REFLEX MICROSCOPIC
Bilirubin Urine: NEGATIVE
Glucose, UA: NEGATIVE mg/dL
Ketones, ur: NEGATIVE mg/dL
Nitrite: NEGATIVE
Protein, ur: 100 mg/dL — AB
RBC / HPF: >50 RBC/hpf — ABNORMAL HIGH (ref 0–5)
Specific Gravity, Urine: 1.009 (ref 1.005–1.030)
pH: 6 (ref 5.0–8.0)

## 2020-11-16 LAB — POCT PREGNANCY, URINE: Preg Test, Ur: POSITIVE — AB

## 2020-11-16 LAB — WET PREP, GENITAL
Clue Cells Wet Prep HPF POC: NONE SEEN
Sperm: NONE SEEN
Trich, Wet Prep: NONE SEEN
Yeast Wet Prep HPF POC: NONE SEEN

## 2020-11-16 LAB — HCG, QUANTITATIVE, PREGNANCY: hCG, Beta Chain, Quant, S: 2679 m[IU]/mL — ABNORMAL HIGH (ref <5)

## 2020-11-16 NOTE — Discharge Instructions (Signed)
Make an appointment for care _ I am sending a message to [facility] to schedule you in the next 2 weeks - hopefully they can if an appointment is open.    Threatened Miscarriage  A threatened miscarriage occurs when a woman has vaginal bleeding during the first 20 weeks of pregnancy but the pregnancy has not ended. If you have vaginal bleeding during this time, your health care provider will do tests to make sure you are still pregnant. If the tests show that you are still pregnant and that the developing baby (fetus) inside your uterus is still growing, your condition is considered a threatened miscarriage. A threatened miscarriage does not mean your pregnancy will end, but it does increase the risk of losing your pregnancy (complete miscarriage). What are the causes? The cause of this condition is usually not known. For women who go on to have a complete miscarriage, the most common cause is an abnormal number of chromosomes in the developing baby. Chromosomes are the structures inside cells that hold all of a person's genetic material. What increases the risk? The following lifestyle factors may increase your risk of a miscarriage in early pregnancy:  Smoking.  Drinking excessive amounts of alcohol or caffeine.  Recreational drug use. The following preexisting health conditions may increase your risk of a miscarriage in early pregnancy:  Polycystic ovary syndrome.  Uterine fibroids.  Infections.  Diabetes mellitus. What are the signs or symptoms? Symptoms of this condition include:  Vaginal bleeding.  Mild abdominal pain or cramps. How is this diagnosed? If you have bleeding with or without abdominal pain before 20 weeks of pregnancy, your health care provider will do tests to check whether you are still pregnant. These will include:  Ultrasound. This test uses sound waves to create images of the inside of your uterus. This allows your health care provider to look at your  developing baby and other structures, such as your placenta.  Pelvic exam. This is an internal exam of your vagina and cervix.  Measurement of your baby's heart rate.  Laboratory tests such as blood tests, urine tests, or swabs for infection You may be diagnosed with a threatened miscarriage if:  Ultrasound testing shows that you are still pregnant.  Your baby's heart rate is strong.  A pelvic exam shows that the opening between your uterus and your vagina (cervix) is closed.  Blood tests confirm that you are still pregnant. How is this treated? No treatments have been shown to prevent a threatened miscarriage from going on to a complete miscarriage. However, the right home care is important. Follow these instructions at home:  Get plenty of rest.  Do not have sex or use tampons if you have vaginal bleeding.  Do not douche.  Do not smoke or use recreational drugs.  Do not drink alcohol.  Avoid caffeine.  Keep all follow-up prenatal visits as told by your health care provider. This is important. Contact a health care provider if:  You have light vaginal bleeding or spotting while pregnant.  You have abdominal pain or cramping.  You have a fever. Get help right away if:  You have heavy vaginal bleeding.  You have blood clots coming from your vagina.  You pass tissue from your vagina.  You leak fluid, or you have a gush of fluid from your vagina.  You have severe low back pain or abdominal cramps.  You have fever, chills, and severe abdominal pain. Summary  A threatened miscarriage occurs when a woman  has vaginal bleeding during the first 20 weeks of pregnancy but the pregnancy has not ended.  The cause of a threatened miscarriage is usually not known.  Symptoms of this condition may include vaginal bleeding and mild abdominal pain or cramps.  No treatments have been shown to prevent a threatened miscarriage from going on to a complete miscarriage.  Keep  all follow-up prenatal visits as told by your health care provider. This is important. This information is not intended to replace advice given to you by your health care provider. Make sure you discuss any questions you have with your health care provider. Document Revised: 12/16/2017 Document Reviewed: 02/05/2017 Elsevier Patient Education  [place].

## 2020-11-16 NOTE — MAU Provider Note (Addendum)
History     CSN: [id number]  Arrival date and time: 11/16/20 1758   Event Date/Time   First Provider Initiated Contact with Patient 11/16/20 1849     CC: Vaginal bleeding in pregnancy  HPI Erica Roth 39 y.o. [redacted]w[redacted]d Comes to MAU with heavy vaginal bleeding.  She had light bleeding that started yesterday and thought it might be implantation bleeding.  Today the bleeding has been heavier and has had some lemon sized clots in the toilet.  Has started having light cramping.  OB History    Gravida  4   Para  3   Term  2   Preterm  1   AB      Living  3     SAB      IAB      Ectopic      Multiple  0   Live Births  3           Past Medical History:  Diagnosis Date  . Fibroid   . Preterm labor     Past Surgical History:  Procedure Laterality Date  . NO PAST SURGERIES      Family History  Problem Relation Age of Onset  . Hypertension Mother     Social History   Tobacco Use  . Smoking status: Never Smoker  . Smokeless tobacco: Never Used  Vaping Use  . Vaping Use: Never used  Substance Use Topics  . Alcohol use: No  . Drug use: No    Allergies: No Known Allergies  Medications Prior to Admission  Medication Sig Dispense Refill Last Dose  . Blood Pressure Monitor DEVI 1 each by Does not apply route once a week. Check Blood Pressure weekly, Large cuff 1 Device 0   . ibuprofen (ADVIL) 800 MG tablet Take 1 tablet (800 mg total) by mouth 3 (three) times daily. 30 tablet 0   . pantoprazole (PROTONIX) 40 MG tablet Take 1 tablet (40 mg total) by mouth daily. 30 tablet 3   . Prenat-FeAsp-Meth-FA-DHA w/o A (PRENATE PIXIE) 10-0.6-0.4-200 MG CAPS Take 1 tablet by mouth daily. 30 capsule 12   . senna-docusate (SENOKOT-S) 8.6-50 MG tablet Take 2 tablets by mouth at bedtime as needed for mild constipation.       Review of Systems  Constitutional: Negative for fever.  Respiratory: Negative for cough and shortness of breath.   Gastrointestinal: Negative for  abdominal pain, nausea and vomiting.  Genitourinary: Positive for vaginal bleeding. Negative for dysuria and vaginal discharge.   Physical Exam   Blood pressure 137/75, pulse 75, temperature 98.2 F (36.8 C), temperature source Oral, resp. rate 16, last menstrual period 09/22/2020, SpO2 100 %, unknown if currently breastfeeding.  Physical Exam Vitals and nursing note reviewed.  Constitutional:      Appearance: She is well-developed and well-nourished.  HENT:     Head: Normocephalic.  Eyes:     Extraocular Movements: EOM normal.  Abdominal:     Palpations: Abdomen is soft.     Tenderness: There is no abdominal tenderness. There is no guarding or rebound.  Genitourinary:    Comments: Vaginal swabs done for GC/Chlam and wet prep Musculoskeletal:        General: Normal range of motion.     Cervical back: Neck supple.  Skin:    General: Skin is warm and dry.  Neurological:     Mental Status: She is alert and oriented to person, place, and time.  Psychiatric:  Mood and Affect: Mood and affect normal.     MAU Course  Procedures LABS Results for orders placed or performed during the hospital encounter of 11/16/20 (from the past 24 hour(s))  Pregnancy, urine POC     Status: Abnormal   Collection Time: 11/16/20  6:10 PM  Result Value Ref Range   Preg Test, Ur POSITIVE (A) NEGATIVE  Urinalysis, Routine w reflex microscopic Urine, Clean Catch     Status: Abnormal   Collection Time: 11/16/20  6:32 PM  Result Value Ref Range   Color, Urine AMBER (A) YELLOW   APPearance HAZY (A) CLEAR   Specific Gravity, Urine 1.009 1.005 - 1.030   pH 6.0 5.0 - 8.0   Glucose, UA NEGATIVE NEGATIVE mg/dL   Hgb urine dipstick LARGE (A) NEGATIVE   Bilirubin Urine NEGATIVE NEGATIVE   Ketones, ur NEGATIVE NEGATIVE mg/dL   Protein, ur 100 (A) NEGATIVE mg/dL   Nitrite NEGATIVE NEGATIVE   Leukocytes,Ua TRACE (A) NEGATIVE   RBC / HPF >50 (H) 0 - 5 RBC/hpf   WBC, UA 0-5 0 - 5 WBC/hpf   Bacteria,  UA RARE (A) NONE SEEN  Wet prep, genital     Status: Abnormal   Collection Time: 11/16/20  6:56 PM  Result Value Ref Range   Yeast Wet Prep HPF POC NONE SEEN NONE SEEN   Trich, Wet Prep NONE SEEN NONE SEEN   Clue Cells Wet Prep HPF POC NONE SEEN NONE SEEN   WBC, Wet Prep HPF POC FEW (A) NONE SEEN   Sperm NONE SEEN   CBC     Status: Abnormal   Collection Time: 11/16/20  7:45 PM  Result Value Ref Range   WBC 9.9 4.0 - 10.5 K/uL   RBC 4.16 3.87 - 5.11 MIL/uL   Hemoglobin 12.2 12.0 - 15.0 g/dL   HCT 35.9 (L) 36.0 - 46.0 %   MCV 86.3 80.0 - 100.0 fL   MCH 29.3 26.0 - 34.0 pg   MCHC 34.0 30.0 - 36.0 g/dL   RDW 14.2 11.5 - 15.5 %   Platelets 242 150 - 400 K/uL   nRBC 0.0 0.0 - 0.2 %  hCG, quantitative, pregnancy     Status: Abnormal   Collection Time: 11/16/20  7:45 PM  Result Value Ref Range   hCG, Beta Chain, Quant, S 2,679 (H) <5 mIU/mL   Ultrasound CLINICAL DATA:  Heavy vaginal bleeding and spotting since yesterday.  EXAM: OBSTETRIC <14 WK [place] AND TRANSVAGINAL OB US  TECHNIQUE: Both transabdominal and transvaginal ultrasound examinations were performed for complete evaluation of the gestation as well as the maternal uterus, adnexal regions, and pelvic cul-de-sac. Transvaginal technique was performed to assess early pregnancy.  COMPARISON:  None.  FINDINGS: Intrauterine gestational sac: Single. The gestational sac appears to be located low within the endometrial canal.  Yolk sac:  Visualized.  Embryo:  Not Visualized.  Cardiac Activity: Not Visualized.  CRL:  4.5 mm   6 w   1 d                  [place] EDC: 07/11/2021  Subchorionic hemorrhage:  None visualized.  Maternal uterus/adnexae: There is a corpus luteal cyst involving the left ovary. There is a calcified fibroid involving the uterine fundus measuring 3.8 x 3.6 x 3.6 cm.  IMPRESSION: 1. Single IUP at 6 weeks and 1 day without evidence for an embryo or cardiac activity. The gestational sac appears  to be abnormally situated in the lower uterine  segment. Findings are suspicious but not yet definitive for failed pregnancy. Recommend follow-up US in 10-14 days for definitive diagnosis. This recommendation follows SRU consensus guidelines: Diagnostic Criteria for Nonviable Pregnancy Early in the First Trimester. [place] Med [id number]. 2. There is a 3.8 cm calcified fibroid.   MDM Given the increased bleeding the patient has had, this may be a miscarriage.   Blood type O positive  Assessment and Plan  Threatened Miscarriage IUP without embryo developed yet - Possible early pregnancy vs failed pregnancy Calcified fibroid  Plan Establish care for this pregnancy. Message sent to [facility] to schedule her within 2 weeks with MD if available.  Client has many questions - has had one miscarriage since her last baby and is worried that this will be another miscarriage. Expectant management at this time as there is no fetal pole but does not yet meet criteria as a failed pregnancy.  Virginia Rochester 11/16/2020, 6:55 PM    ADDENDUM: Discussed with client the ultrasound report as printed above but on review of [place] images, fetal pole was identified on the images.  While this does not change the current plan of care, this does need to be reconciled.  Call made to ultrasound to have radiologist review images again and revise final report if fetal pole is present.  Situation is pending but client has been discharged thinking there is no fetal pole in this pregnancy.  Earlie Server, RN, MSN, NP-BC Nurse Practitioner, [facility] for [employer] [employer] 11/16/2020 9:30 PM

## 2020-11-16 NOTE — MAU Note (Signed)
Pt reports to mau with c/o heavy vag bleeding. Pt states she noticed some spotting yesterday, but bleeding has increased since then. Pt reports lower back pain earlier today, but denies pain at this time.

## 2020-11-17 LAB — RPR: RPR Ser Ql: NONREACTIVE

## 2020-11-18 LAB — GC/CHLAMYDIA PROBE AMP (~~LOC~~) NOT AT ARMC
Chlamydia: NEGATIVE
Comment: NEGATIVE
Comment: NORMAL
Neisseria Gonorrhea: NEGATIVE

## 2020-11-20 ENCOUNTER — Other Ambulatory Visit: Payer: Self-pay | Admitting: Nurse Practitioner

## 2020-11-20 DIAGNOSIS — O209 Hemorrhage in early pregnancy, unspecified: Secondary | ICD-10-CM

## 2021-03-27 ENCOUNTER — Encounter: Payer: Self-pay | Admitting: Obstetrics and Gynecology

## 2021-03-27 ENCOUNTER — Ambulatory Visit (INDEPENDENT_AMBULATORY_CARE_PROVIDER_SITE_OTHER): Payer: Medicaid Other

## 2021-03-27 ENCOUNTER — Other Ambulatory Visit: Payer: Self-pay

## 2021-03-27 DIAGNOSIS — Z3201 Encounter for pregnancy test, result positive: Secondary | ICD-10-CM

## 2021-03-27 DIAGNOSIS — Z3200 Encounter for pregnancy test, result unknown: Secondary | ICD-10-CM

## 2021-03-27 LAB — POCT URINE PREGNANCY: Preg Test, Ur: POSITIVE — AB

## 2021-03-27 NOTE — Progress Notes (Signed)
[name] presents today for UPT. She has no unusual complaints. LMP:02/21/2021    OBJECTIVE: Appears well, in no apparent distress.  OB History    Gravida  4   Para  3   Term  2   Preterm  1   AB      Living  3     SAB      IAB      Ectopic      Multiple  0   Live Births  3          Home UPT Result:positive  In-Office UPT result:positive  I have reviewed the patient's medical, obstetrical, social, and family histories, and medications.   ASSESSMENT: Positive pregnancy test  PLAN Prenatal care to be completed at:  [facility] around 10 weeks.

## 2021-04-14 ENCOUNTER — Other Ambulatory Visit: Payer: Self-pay

## 2021-04-14 ENCOUNTER — Ambulatory Visit (INDEPENDENT_AMBULATORY_CARE_PROVIDER_SITE_OTHER): Payer: Medicaid Other

## 2021-04-14 VITALS — BP 126/75 | HR 64 | Ht 68.9 in | Wt 211.9 lb

## 2021-04-14 DIAGNOSIS — Z3A01 Less than 8 weeks gestation of pregnancy: Secondary | ICD-10-CM

## 2021-04-14 DIAGNOSIS — O3680X0 Pregnancy with inconclusive fetal viability, not applicable or unspecified: Secondary | ICD-10-CM

## 2021-04-14 DIAGNOSIS — Z3481 Encounter for supervision of other normal pregnancy, first trimester: Secondary | ICD-10-CM

## 2021-04-14 DIAGNOSIS — Z3491 Encounter for supervision of normal pregnancy, unspecified, first trimester: Secondary | ICD-10-CM | POA: Insufficient documentation

## 2021-04-14 DIAGNOSIS — O0990 Supervision of high risk pregnancy, unspecified, unspecified trimester: Secondary | ICD-10-CM | POA: Insufficient documentation

## 2021-04-14 NOTE — Progress Notes (Signed)
New OB Intake  I connected with  Erica Roth on 04/14/21 at  1:15 PM EDT by in office and verified that I am speaking with the correct person using two identifiers. Nurse is located at [facility] and pt is located at [place].  I discussed the limitations, risks, security and privacy concerns of performing an evaluation and management service by telephone and the availability of in person appointments. I also discussed with the patient that there may be a patient responsible charge related to this service. The patient expressed understanding and agreed to proceed.  I explained I am completing New OB Intake today. We discussed her EDD of 11/28/21 that is based on LMP of 02/21/21. Pt is G5/P3. I reviewed her allergies, medications, Medical/Surgical/OB history, and appropriate screenings. I informed her of [facility] services. Based on history, this is a/an uncomplicated pregnancy.  Patient Active Problem List   Diagnosis Date Noted  . Encounter for supervision of normal pregnancy in first trimester 04/14/2021  . Post term pregnancy over 40 weeks 06/20/2019  . Supervision of high risk pregnancy, antepartum 12/26/2018  . AMA (advanced maternal age) multigravida 35+ 12/26/2018  . Fibroid uterus 12/29/2016  . History of preterm delivery, currently pregnant 11/02/2016  . Fibroids 10/26/2015    Concerns addressed today  Delivery Plans:  Plans to deliver at [facility].   MyChart/Babyscripts MyChart access verified. I explained pt will have some visits in office and some virtually. Babyscripts instructions given and order placed. Patient verifies receipt of registration text/e-mail. Account successfully created and app downloaded.  Blood Pressure Cuff Patient has one at home Explained after first prenatal appt pt will check weekly and document in Babyscripts.  Anatomy US Explained first scheduled [place] will be around 19 weeks. Dating and Viability scan performed today.  Labs Discussed Johnsie Cancel genetic  screening with patient. Would like both Panorama and Horizon drawn at new OB visit. Routine prenatal labs needed.  Covid Vaccine Patient has covid vaccine.   Social Determinants of Health . Food Insecurity: Patient denies food insecurity. . WIC Referral: Patient is interested in referral to [facility]  . Transportation: Patient denies transportation needs. . Childcare: Discussed no children allowed at ultrasound appointments. Offered childcare services; patient declines childcare services at this time.  First visit review I reviewed new OB appt with pt. I explained she will have a pelvic exam, ob bloodwork with genetic screening, and PAP smear. Explained pt will be seen by Caren Macadam at first visit; encounter routed to appropriate provider. Explained that patient will be seen by pregnancy navigator following visit with provider.  Erica Lei, RN 04/14/2021  1:38 PM

## 2021-04-14 NOTE — Progress Notes (Signed)
Patient was assessed and managed by nursing staff during this encounter. I have reviewed the chart and agree with the documentation and plan. I have also made any necessary editorial changes.  Mora Bellman, MD 04/14/2021 2:54 PM

## 2021-05-01 ENCOUNTER — Ambulatory Visit (INDEPENDENT_AMBULATORY_CARE_PROVIDER_SITE_OTHER): Payer: Medicaid Other | Admitting: Family Medicine

## 2021-05-01 ENCOUNTER — Other Ambulatory Visit: Payer: Self-pay

## 2021-05-01 ENCOUNTER — Other Ambulatory Visit (HOSPITAL_COMMUNITY)
Admission: RE | Admit: 2021-05-01 | Discharge: 2021-05-01 | Disposition: A | Discharge status: Home / Self Care | Payer: Medicaid Other | Source: Ambulatory Visit | Attending: Family Medicine | Admitting: Family Medicine

## 2021-05-01 ENCOUNTER — Encounter: Payer: Self-pay | Admitting: Family Medicine

## 2021-05-01 VITALS — BP 115/75 | HR 66 | Wt 214.0 lb

## 2021-05-01 DIAGNOSIS — O0990 Supervision of high risk pregnancy, unspecified, unspecified trimester: Secondary | ICD-10-CM | POA: Diagnosis present

## 2021-05-01 DIAGNOSIS — O09529 Supervision of elderly multigravida, unspecified trimester: Secondary | ICD-10-CM

## 2021-05-01 DIAGNOSIS — Z3A09 9 weeks gestation of pregnancy: Secondary | ICD-10-CM

## 2021-05-01 DIAGNOSIS — O09899 Supervision of other high risk pregnancies, unspecified trimester: Secondary | ICD-10-CM

## 2021-05-01 LAB — HEPATITIS C ANTIBODY: HCV Ab: NEGATIVE

## 2021-05-01 MED ORDER — PREPLUS 27-1 MG PO TABS: 1.0000 | ORAL_TABLET | Freq: Every day | ORAL | 13 refills | Status: AC

## 2021-05-01 MED ORDER — CLOTRIMAZOLE 1 % VA CREA: 1.0000 | TOPICAL_CREAM | Freq: Every day | VAGINAL | 2 refills | Status: DC

## 2021-05-01 MED ORDER — ASPIRIN EC 81 MG PO TBEC: 81.0000 mg | DELAYED_RELEASE_TABLET | Freq: Every day | ORAL | 2 refills | Status: DC

## 2021-05-01 NOTE — Addendum Note (Signed)
Addended by: Lewie Loron D on: 05/01/2021 03:59 PM   Modules accepted: Orders

## 2021-05-01 NOTE — Addendum Note (Signed)
Addended by: Caryl Bis on: 05/01/2021 03:50 PM   Modules accepted: Orders

## 2021-05-01 NOTE — Progress Notes (Signed)
Pt presents for NOB provider visit. Normal pap smear 01/23/2019 NOB intake completed 04/14/21 Pt declines genetic screening.

## 2021-05-01 NOTE — Progress Notes (Signed)
INITIAL PRENATAL VISIT  Subjective:   Erica Roth is being seen today for her first obstetrical visit.  This is a planned pregnancy. This is a desired pregnancy.  She is at [redacted]w[redacted]d gestation by LMP Her obstetrical history is significant for advanced maternal age and obesity. Relationship with FOB: spouse, living together. Patient does intend to breast feed. Pregnancy history fully reviewed.  Patient reports no complaints.  Indications for ASA therapy (per uptodate) One of the following: Previous pregnancy with preeclampsia, especially early onset and with an adverse outcome No Multifetal gestation No Chronic hypertension No Type 1 or 2 diabetes mellitus No Chronic kidney disease No Autoimmune disease (antiphospholipid syndrome, systemic lupus erythematosus) No  Two or more of the following: Nulliparity No Obesity (body mass index >30 kg/m2) Yes Family history of preeclampsia in mother or sister No Age ?49 years Yes Sociodemographic characteristics (African American race, low socioeconomic level) Yes Personal risk factors (eg, previous pregnancy with low birth weight or small for gestational age infant, previous adverse pregnancy outcome [eg, stillbirth], interval >10 years between pregnancies) No  Indications for early GDM screening  First-degree relative with diabetes No BMI >30kg/m2 Yes Age > 25 Yes Previous birth of an infant weighing ?4000 g No Gestational diabetes mellitus in a previous pregnancy No Glycated hemoglobin ?5.7 percent (39 mmol/mol), impaired glucose tolerance, or impaired fasting glucose on previous testing No High-risk race/ethnicity (eg, African American, Latino, Native American, Asian American, Pacific Islander) Yes Previous stillbirth of unknown cause No Maternal birthweight > 9 lbs No History of cardiovascular disease No Hypertension or on therapy for hypertension No High-density lipoprotein cholesterol level <35 mg/dL (0.90 mmol/L) and/or a  triglyceride level >250 mg/dL (2.82 mmol/L) No Polycystic ovary syndrome No Physical inactivity No Other clinical condition associated with insulin resistance (eg, severe obesity, acanthosis nigricans) No Current use of glucocorticoids No   Early screening tests: FBS, A1C, Random CBG, glucose challenge   Review of Systems:   Review of Systems  Objective:    Obstetric History OB History  Gravida Para Term Preterm AB Living  5 3 2 1 1 3   SAB IAB Ectopic Multiple Live Births  1     0 3    # Outcome Date GA Lbr Len/2nd Weight Sex Delivery Anes PTL Lv  5 Current           4 SAB 10/2020          3 Term 06/20/19 [redacted]w[redacted]d / 00:07 7 lb 10 oz (3.459 kg) F Vag-Spont None  LIV  2 Term 04/21/17 [redacted]w[redacted]d 07:38 / 01:07 6 lb 10.4 oz (3.016 kg) F Vag-Spont None  LIV  1 Preterm 03/09/16 [redacted]w[redacted]d 00:42 / 01:23 4 lb 0.9 oz (1.84 kg) F Vag-Spont EPI  LIV     Birth Comments: skeletal deformity in right leg    Past Medical History:  Diagnosis Date   Fibroid    Preterm labor     Past Surgical History:  Procedure Laterality Date   NO PAST SURGERIES      Current Outpatient Medications on File Prior to Visit  Medication Sig Dispense Refill   Blood Pressure Monitor DEVI 1 each by Does not apply route once a week. Check Blood Pressure weekly, Large cuff 1 Device 0   Prenat-FeAsp-Meth-FA-DHA w/o A (PRENATE PIXIE) 10-0.6-0.4-200 MG CAPS Take 1 tablet by mouth daily. 30 capsule 12   pantoprazole (PROTONIX) 40 MG tablet Take 1 tablet (40 mg total) by mouth daily. (Patient not taking: No sig reported)  30 tablet 3   No current facility-administered medications on file prior to visit.    No Known Allergies  Social History:  reports that she has never smoked. She has never used smokeless tobacco. She reports that she does not drink alcohol and does not use drugs.  Family History  Problem Relation Age of Onset   Hypertension Mother     The following portions of the patient's history were reviewed and  updated as appropriate: allergies, current medications, past family history, past medical history, past social history, past surgical history and problem list.  Review of Systems Review of Systems    Physical Exam:  BP 115/75   Pulse 66   Wt 214 lb (97.1 kg)   LMP 02/21/2021   BMI 31.70 kg/m  CONSTITUTIONAL: Well-developed, well-nourished female in no acute distress.  HENT:  Normocephalic, atraumatic, External right and left ear normal. Oropharynx is clear and moist EYES: Conjunctivae normal. No scleral icterus.  NECK: Normal range of motion, supple, no masses.  Normal thyroid.  SKIN: Skin is warm and dry. No rash noted. Not diaphoretic. No erythema. No pallor. MUSCULOSKELETAL: Normal range of motion. No tenderness.  No cyanosis, clubbing, or edema.   NEUROLOGIC: Alert and oriented to person, place, and time. Normal muscle tone coordination.  PSYCHIATRIC: Normal mood and affect. Normal behavior. Normal judgment and thought content. CARDIOVASCULAR: Normal heart rate noted, regular rhythm RESPIRATORY: Clear to auscultation bilaterally. Effort and breath sounds normal, no problems with respiration noted. BREASTS: Symmetric in size. No masses, skin changes, nipple drainage, or lymphadenopathy. ABDOMEN: Soft, normal bowel sounds, no distention noted.  No tenderness, rebound or guarding. Fundal ht: below pelvis PELVIC: Not indicated FHR: 160   Assessment:    Pregnancy: [id number]   1. Supervision of high risk pregnancy, antepartum - Culture, OB Urine - Cervicovaginal ancillary only - Obstetric Panel, Including HIV - Hepatitis C antibody - [place] MFM OB DETAIL +14 WK; Future - Hemoglobin A1c  2. [redacted] weeks gestation of pregnancy  3. Antepartum multigravida of advanced maternal age - Reviewed recommendation for NIPT, declined - aspirin EC 81 MG tablet; Take 1 tablet (81 mg total) by mouth daily. Take after 12 weeks for prevention of preeclampsia later in pregnancy  Dispense: 300 tablet;  Refill: 2  4. History of preterm delivery, currently pregnant Declined 17 P     Plan:     Initial labs drawn. Prenatal vitamins. Problem list reviewed and updated. Reviewed in detail the nature of the practice with collaborative care between  Genetic screening discussed: NIPS/First trimester screen/Quad/AFP declined. Role of ultrasound in pregnancy discussed; Anatomy US: requested. Amniocentesis discussed: undecided. Follow up in 4 weeks. Discussed clinic routines, schedule of care and testing, genetic screening options, involvement of students and residents under the direct supervision of APPs and doctors and presence of female providers. Pt verbalized understanding.   Caren Macadam, MD 05/01/2021 3:26 PM

## 2021-05-02 LAB — OBSTETRIC PANEL, INCLUDING HIV
Antibody Screen: NEGATIVE
Basophils Absolute: 0 10*3/uL (ref 0.0–0.2)
Basos: 0 %
EOS (ABSOLUTE): 0 10*3/uL (ref 0.0–0.4)
Eos: 0 %
HIV Screen 4th Generation wRfx: NONREACTIVE
Hematocrit: 37.2 % (ref 34.0–46.6)
Hemoglobin: 12.4 g/dL (ref 11.1–15.9)
Hepatitis B Surface Ag: NEGATIVE
Immature Grans (Abs): 0 10*3/uL (ref 0.0–0.1)
Immature Granulocytes: 0 %
Lymphocytes Absolute: 1.9 10*3/uL (ref 0.7–3.1)
Lymphs: 25 %
MCH: 29.5 pg (ref 26.6–33.0)
MCHC: 33.3 g/dL (ref 31.5–35.7)
MCV: 89 fL (ref 79–97)
Monocytes Absolute: 0.4 10*3/uL (ref 0.1–0.9)
Monocytes: 5 %
Neutrophils Absolute: 5.2 10*3/uL (ref 1.4–7.0)
Neutrophils: 70 %
Platelets: 215 10*3/uL (ref 150–450)
RBC: 4.2 x10E6/uL (ref 3.77–5.28)
RDW: 13.6 % (ref 11.7–15.4)
RPR Ser Ql: NONREACTIVE
Rh Factor: POSITIVE
Rubella Antibodies, IGG: 6.77 index (ref >0.99)
WBC: 7.6 10*3/uL (ref 3.4–10.8)

## 2021-05-02 LAB — HEMOGLOBIN A1C
Est. average glucose Bld gHb Est-mCnc: 111 mg/dL
Hgb A1c MFr Bld: 5.5 % (ref 4.8–5.6)

## 2021-05-02 LAB — HEPATITIS C ANTIBODY: Hep C Virus Ab: <0.1 s/co ratio (ref 0.0–0.9)

## 2021-05-03 LAB — URINE CULTURE, OB REFLEX

## 2021-05-03 LAB — CULTURE, OB URINE

## 2021-05-13 LAB — URINE CYTOLOGY ANCILLARY ONLY
Chlamydia: NEGATIVE
Comment: NEGATIVE
Comment: NORMAL
Neisseria Gonorrhea: NEGATIVE

## 2021-06-02 ENCOUNTER — Ambulatory Visit (INDEPENDENT_AMBULATORY_CARE_PROVIDER_SITE_OTHER): Payer: Medicaid Other | Admitting: Obstetrics and Gynecology

## 2021-06-02 ENCOUNTER — Encounter: Payer: Self-pay | Admitting: Obstetrics and Gynecology

## 2021-06-02 ENCOUNTER — Other Ambulatory Visit: Payer: Self-pay

## 2021-06-02 VITALS — BP 121/72 | HR 67 | Wt 216.0 lb

## 2021-06-02 DIAGNOSIS — O09899 Supervision of other high risk pregnancies, unspecified trimester: Secondary | ICD-10-CM

## 2021-06-02 DIAGNOSIS — O0990 Supervision of high risk pregnancy, unspecified, unspecified trimester: Secondary | ICD-10-CM

## 2021-06-02 DIAGNOSIS — O09522 Supervision of elderly multigravida, second trimester: Secondary | ICD-10-CM

## 2021-06-02 NOTE — Progress Notes (Signed)
No complaints today.

## 2021-06-02 NOTE — Progress Notes (Signed)
   PRENATAL VISIT NOTE  Subjective:  Erica Roth is a 40 y.o. [id number] at [redacted]w[redacted]d being seen today for ongoing prenatal care.  She is currently monitored for the following issues for this high-risk pregnancy and has Fibroids; History of preterm delivery, currently pregnant; Fibroid uterus; AMA (advanced maternal age) multigravida 42+; and Supervision of high risk pregnancy, antepartum on their problem list.  Patient reports no complaints.  Contractions: Not present. Vag. Bleeding: None.   . Denies leaking of fluid.   The following portions of the patient's history were reviewed and updated as appropriate: allergies, current medications, past family history, past medical history, past social history, past surgical history and problem list.   Objective:   Vitals:   06/02/21 1551  BP: 121/72  Pulse: 67  Weight: 216 lb (98 kg)    Fetal Status: Fetal Heart Rate (bpm): 153         General:  Alert, oriented and cooperative. Patient is in no acute distress.  Skin: Skin is warm and dry. No rash noted.   Cardiovascular: Normal heart rate noted  Respiratory: Normal respiratory effort, no problems with respiration noted  Abdomen: Soft, gravid, appropriate for gestational age.  Pain/Pressure: Absent     Pelvic: Cervical exam deferred        Extremities: Normal range of motion.  Edema: None  Mental Status: Normal mood and affect. Normal behavior. Normal judgment and thought content.   Assessment and Plan:  Pregnancy: [id number] at [redacted]w[redacted]d 1. Supervision of high risk pregnancy, antepartum Patient is doing well without complaints Anatomy ultrasound scheduled  2. History of preterm delivery, currently pregnant Declined 17-P  3. Multigravida of advanced maternal age in second trimester Continue ASA  Preterm labor symptoms and general obstetric precautions including but not limited to vaginal bleeding, contractions, leaking of fluid and fetal movement were reviewed in detail with the  patient. Please refer to After Visit Summary for other counseling recommendations.   Return in about 4 weeks (around 06/30/2021) for in person, ROB, High risk.  Future Appointments  Date Time Provider [place]  07/04/2021 12:45 PM [facility] NURSE [facility]  07/04/2021  1:00 PM [facility] US1 [facility]    Mora Bellman, MD

## 2021-06-30 ENCOUNTER — Ambulatory Visit (INDEPENDENT_AMBULATORY_CARE_PROVIDER_SITE_OTHER): Payer: Medicaid Other | Admitting: Obstetrics & Gynecology

## 2021-06-30 ENCOUNTER — Other Ambulatory Visit: Payer: Self-pay

## 2021-06-30 ENCOUNTER — Encounter: Payer: Self-pay | Admitting: Obstetrics & Gynecology

## 2021-06-30 VITALS — BP 113/65 | HR 73 | Wt 214.0 lb

## 2021-06-30 DIAGNOSIS — D259 Leiomyoma of uterus, unspecified: Secondary | ICD-10-CM

## 2021-06-30 DIAGNOSIS — O09899 Supervision of other high risk pregnancies, unspecified trimester: Secondary | ICD-10-CM

## 2021-06-30 DIAGNOSIS — O09529 Supervision of elderly multigravida, unspecified trimester: Secondary | ICD-10-CM

## 2021-06-30 DIAGNOSIS — O0990 Supervision of high risk pregnancy, unspecified, unspecified trimester: Secondary | ICD-10-CM

## 2021-06-30 DIAGNOSIS — O09522 Supervision of elderly multigravida, second trimester: Secondary | ICD-10-CM

## 2021-06-30 MED ORDER — ASPIRIN EC 81 MG PO TBEC: 81.0000 mg | DELAYED_RELEASE_TABLET | Freq: Every day | ORAL | 2 refills | Status: DC
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
## 2021-07-04 ENCOUNTER — Ambulatory Visit: Payer: Medicaid Other

## 2021-07-04 ENCOUNTER — Other Ambulatory Visit: Payer: Medicaid Other

## 2021-08-04 ENCOUNTER — Encounter: Payer: Self-pay | Admitting: Obstetrics and Gynecology

## 2021-08-04 ENCOUNTER — Other Ambulatory Visit: Payer: Self-pay

## 2021-08-04 ENCOUNTER — Ambulatory Visit (INDEPENDENT_AMBULATORY_CARE_PROVIDER_SITE_OTHER): Payer: Medicaid Other | Admitting: Obstetrics and Gynecology

## 2021-08-04 VITALS — BP 112/68 | HR 65 | Wt 220.0 lb

## 2021-08-04 DIAGNOSIS — O09899 Supervision of other high risk pregnancies, unspecified trimester: Secondary | ICD-10-CM

## 2021-08-04 DIAGNOSIS — O0990 Supervision of high risk pregnancy, unspecified, unspecified trimester: Secondary | ICD-10-CM

## 2021-08-04 DIAGNOSIS — O09522 Supervision of elderly multigravida, second trimester: Secondary | ICD-10-CM

## 2021-08-04 MED ORDER — DOCUSATE SODIUM 100 MG PO CAPS: 100.0000 mg | ORAL_CAPSULE | Freq: Two times a day (BID) | ORAL | 2 refills | Status: DC | PRN

## 2021-08-04 NOTE — Progress Notes (Signed)
ROB, reports no concerns today.
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
## 2021-08-11 ENCOUNTER — Other Ambulatory Visit: Payer: Self-pay

## 2021-08-11 ENCOUNTER — Ambulatory Visit: Payer: Medicaid Other | Attending: Family Medicine | Admitting: *Deleted

## 2021-08-11 ENCOUNTER — Ambulatory Visit (HOSPITAL_BASED_OUTPATIENT_CLINIC_OR_DEPARTMENT_OTHER): Payer: Medicaid Other

## 2021-08-11 ENCOUNTER — Other Ambulatory Visit: Payer: Self-pay | Admitting: *Deleted

## 2021-08-11 ENCOUNTER — Encounter: Payer: Self-pay | Admitting: *Deleted

## 2021-08-11 VITALS — BP 125/58 | HR 69

## 2021-08-11 DIAGNOSIS — O3413 Maternal care for benign tumor of corpus uteri, third trimester: Secondary | ICD-10-CM | POA: Insufficient documentation

## 2021-08-11 DIAGNOSIS — O09293 Supervision of pregnancy with other poor reproductive or obstetric history, third trimester: Secondary | ICD-10-CM | POA: Diagnosis present

## 2021-08-11 DIAGNOSIS — Z3A34 34 weeks gestation of pregnancy: Secondary | ICD-10-CM | POA: Diagnosis not present

## 2021-08-11 DIAGNOSIS — Z363 Encounter for antenatal screening for malformations: Secondary | ICD-10-CM | POA: Insufficient documentation

## 2021-08-11 DIAGNOSIS — O09523 Supervision of elderly multigravida, third trimester: Secondary | ICD-10-CM | POA: Diagnosis not present

## 2021-08-11 DIAGNOSIS — O99213 Obesity complicating pregnancy, third trimester: Secondary | ICD-10-CM | POA: Diagnosis not present

## 2021-08-11 DIAGNOSIS — O0990 Supervision of high risk pregnancy, unspecified, unspecified trimester: Secondary | ICD-10-CM

## 2021-08-11 DIAGNOSIS — Z3689 Encounter for other specified antenatal screening: Secondary | ICD-10-CM

## 2021-09-01 ENCOUNTER — Encounter: Payer: Self-pay | Admitting: Obstetrics and Gynecology

## 2021-09-01 ENCOUNTER — Ambulatory Visit (INDEPENDENT_AMBULATORY_CARE_PROVIDER_SITE_OTHER): Payer: Medicaid Other | Admitting: Obstetrics and Gynecology

## 2021-09-01 ENCOUNTER — Other Ambulatory Visit: Payer: Medicaid Other

## 2021-09-01 ENCOUNTER — Other Ambulatory Visit: Payer: Self-pay

## 2021-09-01 VITALS — BP 106/64 | HR 72 | Wt 223.0 lb

## 2021-09-01 DIAGNOSIS — O09899 Supervision of other high risk pregnancies, unspecified trimester: Secondary | ICD-10-CM

## 2021-09-01 DIAGNOSIS — O0990 Supervision of high risk pregnancy, unspecified, unspecified trimester: Secondary | ICD-10-CM

## 2021-09-01 DIAGNOSIS — O09522 Supervision of elderly multigravida, second trimester: Secondary | ICD-10-CM

## 2021-09-01 DIAGNOSIS — D259 Leiomyoma of uterus, unspecified: Secondary | ICD-10-CM

## 2021-09-01 NOTE — Progress Notes (Signed)
ROB, late for GTT; will need to reschedule.

## 2021-09-01 NOTE — Patient Instructions (Signed)

## 2021-09-01 NOTE — Progress Notes (Signed)
Subjective:  Erica Roth is a 40 y.o. [id number] at [redacted]w[redacted]d being seen today for ongoing prenatal care.  She is currently monitored for the following issues for this high-risk pregnancy and has Fibroids; History of preterm delivery, currently pregnant; Fibroid uterus; AMA (advanced maternal age) multigravida 38+; and Supervision of high risk pregnancy, antepartum on their problem list.  Patient reports no complaints.  Contractions: Not present. Vag. Bleeding: None.  Movement: Present. Denies leaking of fluid.   The following portions of the patient's history were reviewed and updated as appropriate: allergies, current medications, past family history, past medical history, past social history, past surgical history and problem list. Problem list updated.  Objective:   Vitals:   09/01/21 1007  BP: 106/64  Pulse: 72  Weight: 223 lb (101.2 kg)    Fetal Status: Fetal Heart Rate (bpm): 151   Movement: Present     General:  Alert, oriented and cooperative. Patient is in no acute distress.  Skin: Skin is warm and dry. No rash noted.   Cardiovascular: Normal heart rate noted  Respiratory: Normal respiratory effort, no problems with respiration noted  Abdomen: Soft, gravid, appropriate for gestational age. Pain/Pressure: Absent     Pelvic:  Cervical exam deferred        Extremities: Normal range of motion.  Edema: None  Mental Status: Normal mood and affect. Normal behavior. Normal judgment and thought content.   Urinalysis:      Assessment and Plan:  Pregnancy: [id number] at [redacted]w[redacted]d  1. Supervision of high risk pregnancy, antepartum Stable Had to reschedule Glucola ppt  2. Multigravida of advanced maternal age in second trimester Stable Growth 20 % on 08/11/21 F/U growth scan next month  3. History of preterm delivery, currently pregnant Declined 17 OHP Stable  4. Uterine leiomyoma, unspecified location Satble  Preterm labor symptoms and general obstetric precautions including but not  limited to vaginal bleeding, contractions, leaking of fluid and fetal movement were reviewed in detail with the patient. Please refer to After Visit Summary for other counseling recommendations.  Return in about 3 years (around 09/01/2024) for OB visit, face to face, MD only.   Chancy Milroy, MD

## 2021-09-15 ENCOUNTER — Other Ambulatory Visit: Payer: Medicaid Other

## 2021-09-15 ENCOUNTER — Encounter: Payer: Self-pay | Admitting: Obstetrics and Gynecology

## 2021-09-15 ENCOUNTER — Ambulatory Visit (INDEPENDENT_AMBULATORY_CARE_PROVIDER_SITE_OTHER): Payer: Medicaid Other | Admitting: Obstetrics and Gynecology

## 2021-09-15 ENCOUNTER — Other Ambulatory Visit: Payer: Self-pay

## 2021-09-15 VITALS — BP 112/72 | HR 65 | Wt 223.4 lb

## 2021-09-15 DIAGNOSIS — O09899 Supervision of other high risk pregnancies, unspecified trimester: Secondary | ICD-10-CM

## 2021-09-15 DIAGNOSIS — D259 Leiomyoma of uterus, unspecified: Secondary | ICD-10-CM

## 2021-09-15 DIAGNOSIS — O09523 Supervision of elderly multigravida, third trimester: Secondary | ICD-10-CM

## 2021-09-15 DIAGNOSIS — O0990 Supervision of high risk pregnancy, unspecified, unspecified trimester: Secondary | ICD-10-CM

## 2021-09-15 NOTE — Progress Notes (Signed)
Subjective:  Erica Roth is a 40 y.o. [id number] at [redacted]w[redacted]d being seen today for ongoing prenatal care.  She is currently monitored for the following issues for this high-risk pregnancy and has Fibroids; History of preterm delivery, currently pregnant; Fibroid uterus; AMA (advanced maternal age) multigravida 51+; and Supervision of high risk pregnancy, antepartum on their problem list.  Patient reports no complaints.  Contractions: Not present. Vag. Bleeding: None.  Movement: Present. Denies leaking of fluid.   The following portions of the patient's history were reviewed and updated as appropriate: allergies, current medications, past family history, past medical history, past social history, past surgical history and problem list. Problem list updated.  Objective:   Vitals:   09/15/21 0938  BP: 112/72  Pulse: 65  Weight: 223 lb 6.4 oz (101.3 kg)    Fetal Status:     Movement: Present     General:  Alert, oriented and cooperative. Patient is in no acute distress.  Skin: Skin is warm and dry. No rash noted.   Cardiovascular: Normal heart rate noted  Respiratory: Normal respiratory effort, no problems with respiration noted  Abdomen: Soft, gravid, appropriate for gestational age. Pain/Pressure: Absent     Pelvic:  Cervical exam deferred        Extremities: Normal range of motion.  Edema: None  Mental Status: Normal mood and affect. Normal behavior. Normal judgment and thought content.   Urinalysis:      Assessment and Plan:  Pregnancy: [id number] at [redacted]w[redacted]d  1. Supervision of high risk pregnancy, antepartum Stable - Glucose Tolerance, 2 Hours w/1 Hour - CBC - HIV Antibody (routine testing w rflx) - RPR  2. Multigravida of advanced maternal age in third trimester Stable F/U growth scan next week  3. History of preterm delivery, currently pregnant Stable Declined 17 OHP  4. Uterine leiomyoma, unspecified location Stable Growth scan as noted above  Preterm labor symptoms and  general obstetric precautions including but not limited to vaginal bleeding, contractions, leaking of fluid and fetal movement were reviewed in detail with the patient. Please refer to After Visit Summary for other counseling recommendations.  Return in about 2 weeks (around 09/29/2021) for OB visit, face to face, any provider.   Chancy Milroy, MD

## 2021-09-15 NOTE — Progress Notes (Signed)
Decline tdap/flu

## 2021-09-15 NOTE — Patient Instructions (Signed)

## 2021-09-16 LAB — HIV ANTIBODY (ROUTINE TESTING W REFLEX): HIV Screen 4th Generation wRfx: NONREACTIVE

## 2021-09-16 LAB — CBC
Hematocrit: 34.9 % (ref 34.0–46.6)
Hemoglobin: 11.4 g/dL (ref 11.1–15.9)
MCH: 29.3 pg (ref 26.6–33.0)
MCHC: 32.7 g/dL (ref 31.5–35.7)
MCV: 90 fL (ref 79–97)
Platelets: 171 10*3/uL (ref 150–450)
RBC: 3.89 x10E6/uL (ref 3.77–5.28)
RDW: 13.1 % (ref 11.7–15.4)
WBC: 6.1 10*3/uL (ref 3.4–10.8)

## 2021-09-16 LAB — GLUCOSE TOLERANCE, 2 HOURS W/ 1HR
Glucose, 1 hour: 125 mg/dL (ref 70–179)
Glucose, 2 hour: 107 mg/dL (ref 70–152)
Glucose, Fasting: 82 mg/dL (ref 70–91)

## 2021-09-16 LAB — RPR: RPR Ser Ql: NONREACTIVE

## 2021-09-29 ENCOUNTER — Encounter: Payer: Self-pay | Admitting: Obstetrics and Gynecology

## 2021-09-29 ENCOUNTER — Ambulatory Visit (INDEPENDENT_AMBULATORY_CARE_PROVIDER_SITE_OTHER): Payer: Medicaid Other | Admitting: Obstetrics and Gynecology

## 2021-09-29 ENCOUNTER — Other Ambulatory Visit: Payer: Self-pay

## 2021-09-29 VITALS — BP 108/71 | HR 71 | Wt 224.1 lb

## 2021-09-29 DIAGNOSIS — O09899 Supervision of other high risk pregnancies, unspecified trimester: Secondary | ICD-10-CM

## 2021-09-29 DIAGNOSIS — O09523 Supervision of elderly multigravida, third trimester: Secondary | ICD-10-CM

## 2021-09-29 DIAGNOSIS — O0990 Supervision of high risk pregnancy, unspecified, unspecified trimester: Secondary | ICD-10-CM

## 2021-09-29 NOTE — Progress Notes (Signed)
   PRENATAL VISIT NOTE  Subjective:  Erica Roth is a 40 y.o. [id number] at [redacted]w[redacted]d being seen today for ongoing prenatal care.  She is currently monitored for the following issues for this high-risk pregnancy and has Fibroids; History of preterm delivery, currently pregnant; Fibroid uterus; AMA (advanced maternal age) multigravida 13+; and Supervision of high risk pregnancy, antepartum on their problem list.  Patient reports no complaints.  Contractions: Not present.  .  Movement: Present. Denies leaking of fluid.   The following portions of the patient's history were reviewed and updated as appropriate: allergies, current medications, past family history, past medical history, past social history, past surgical history and problem list.   Objective:   Vitals:   09/29/21 0943  BP: 108/71  Pulse: 71  Weight: 224 lb 1.6 oz (101.7 kg)    Fetal Status: Fetal Heart Rate (bpm): 155 Fundal Height: 32 cm Movement: Present     General:  Alert, oriented and cooperative. Patient is in no acute distress.  Skin: Skin is warm and dry. No rash noted.   Cardiovascular: Normal heart rate noted  Respiratory: Normal respiratory effort, no problems with respiration noted  Abdomen: Soft, gravid, appropriate for gestational age.  Pain/Pressure: Absent     Pelvic: Cervical exam deferred        Extremities: Normal range of motion.  Edema: None  Mental Status: Normal mood and affect. Normal behavior. Normal judgment and thought content.   Assessment and Plan:  Pregnancy: [id number] at [redacted]w[redacted]d 1. Supervision of high risk pregnancy, antepartum Patient is doing well without complaints Patient undecided on contraception  2. Multigravida of advanced maternal age in third trimester Follow up growth scan 11/14  3. History of preterm delivery, currently pregnant Declined 17-P  Preterm labor symptoms and general obstetric precautions including but not limited to vaginal bleeding, contractions, leaking of fluid and  fetal movement were reviewed in detail with the patient. Please refer to After Visit Summary for other counseling recommendations.   Return in about 2 weeks (around 10/13/2021) for in person, ROB, High risk.  Future Appointments  Date Time Provider [place]  10/06/2021 11:15 AM [facility] N[facility]SE [facility]  10/06/2021 11:30 AM [facility] US3 [facility]  10/13/2021 11:15 AM Chayanne Speir, Vickii Chafe, MD [facility] None    Mora Bellman, MD

## 2021-10-06 ENCOUNTER — Ambulatory Visit: Payer: Medicaid Other

## 2021-10-13 ENCOUNTER — Other Ambulatory Visit: Payer: Self-pay

## 2021-10-13 ENCOUNTER — Ambulatory Visit (INDEPENDENT_AMBULATORY_CARE_PROVIDER_SITE_OTHER): Payer: Medicaid Other | Admitting: Obstetrics and Gynecology

## 2021-10-13 ENCOUNTER — Encounter: Payer: Self-pay | Admitting: Obstetrics and Gynecology

## 2021-10-13 VITALS — BP 100/64 | HR 82 | Wt 225.0 lb

## 2021-10-13 DIAGNOSIS — O0990 Supervision of high risk pregnancy, unspecified, unspecified trimester: Secondary | ICD-10-CM

## 2021-10-13 DIAGNOSIS — O09899 Supervision of other high risk pregnancies, unspecified trimester: Secondary | ICD-10-CM

## 2021-10-13 DIAGNOSIS — O09523 Supervision of elderly multigravida, third trimester: Secondary | ICD-10-CM

## 2021-10-13 NOTE — Progress Notes (Signed)
   PRENATAL VISIT NOTE  Subjective:  Erica Roth is a 40 y.o. [id number] at [redacted]w[redacted]d being seen today for ongoing prenatal care.  She is currently monitored for the following issues for this high-risk pregnancy and has Fibroids; History of preterm delivery, currently pregnant; Fibroid uterus; AMA (advanced maternal age) multigravida 6+; and Supervision of high risk pregnancy, antepartum on their problem list.  Patient reports no complaints.  Contractions: Irregular. Vag. Bleeding: None.  Movement: Present. Denies leaking of fluid.   The following portions of the patient's history were reviewed and updated as appropriate: allergies, current medications, past family history, past medical history, past social history, past surgical history and problem list.   Objective:   Vitals:   10/13/21 1110  BP: 100/64  Pulse: 82  Weight: 225 lb (102.1 kg)    Fetal Status: Fetal Heart Rate (bpm): 153 Fundal Height: 33 cm Movement: Present     General:  Alert, oriented and cooperative. Patient is in no acute distress.  Skin: Skin is warm and dry. No rash noted.   Cardiovascular: Normal heart rate noted  Respiratory: Normal respiratory effort, no problems with respiration noted  Abdomen: Soft, gravid, appropriate for gestational age.  Pain/Pressure: Absent     Pelvic: Cervical exam deferred        Extremities: Normal range of motion.  Edema: None  Mental Status: Normal mood and affect. Normal behavior. Normal judgment and thought content.   Assessment and Plan:  Pregnancy: [id number] at [redacted]w[redacted]d 1. Supervision of high risk pregnancy, antepartum Patient is doing well without complaints  2. Multigravida of advanced maternal age in third trimester Patient missed last ultrasound which has been rescheduled for 12/5  3. History of preterm delivery, currently pregnant Previously declined 17-P  Preterm labor symptoms and general obstetric precautions including but not limited to vaginal bleeding,  contractions, leaking of fluid and fetal movement were reviewed in detail with the patient. Please refer to After Visit Summary for other counseling recommendations.   Return in about 2 weeks (around 10/27/2021) for in person, ROB, High risk.  Future Appointments  Date Time Provider Branch  10/27/2021 12:30 PM [facility] NURSE [facility]  10/27/2021 12:45 PM [facility] US6 [facility]    Mora Bellman, MD

## 2021-10-27 ENCOUNTER — Other Ambulatory Visit: Payer: Self-pay

## 2021-10-27 ENCOUNTER — Encounter: Payer: Self-pay | Admitting: *Deleted

## 2021-10-27 ENCOUNTER — Ambulatory Visit: Payer: Medicaid Other | Admitting: *Deleted

## 2021-10-27 ENCOUNTER — Ambulatory Visit: Payer: Medicaid Other | Attending: Obstetrics and Gynecology

## 2021-10-27 ENCOUNTER — Other Ambulatory Visit: Payer: Self-pay | Admitting: *Deleted

## 2021-10-27 VITALS — BP 117/66 | HR 88

## 2021-10-27 DIAGNOSIS — O99213 Obesity complicating pregnancy, third trimester: Secondary | ICD-10-CM

## 2021-10-27 DIAGNOSIS — O0990 Supervision of high risk pregnancy, unspecified, unspecified trimester: Secondary | ICD-10-CM

## 2021-10-27 DIAGNOSIS — Z3689 Encounter for other specified antenatal screening: Secondary | ICD-10-CM | POA: Diagnosis present

## 2021-10-27 DIAGNOSIS — O09523 Supervision of elderly multigravida, third trimester: Secondary | ICD-10-CM

## 2021-10-27 DIAGNOSIS — Z3A35 35 weeks gestation of pregnancy: Secondary | ICD-10-CM | POA: Diagnosis not present

## 2021-10-27 DIAGNOSIS — E669 Obesity, unspecified: Secondary | ICD-10-CM

## 2021-10-27 DIAGNOSIS — O4103X1 Oligohydramnios, third trimester, fetus 1: Secondary | ICD-10-CM

## 2021-11-03 ENCOUNTER — Ambulatory Visit (INDEPENDENT_AMBULATORY_CARE_PROVIDER_SITE_OTHER): Payer: Medicaid Other | Admitting: Obstetrics and Gynecology

## 2021-11-03 ENCOUNTER — Ambulatory Visit: Payer: Medicaid Other | Admitting: *Deleted

## 2021-11-03 ENCOUNTER — Encounter: Payer: Self-pay | Admitting: Obstetrics and Gynecology

## 2021-11-03 ENCOUNTER — Ambulatory Visit: Payer: Medicaid Other | Attending: Maternal & Fetal Medicine

## 2021-11-03 ENCOUNTER — Other Ambulatory Visit: Payer: Self-pay

## 2021-11-03 ENCOUNTER — Other Ambulatory Visit (HOSPITAL_COMMUNITY)
Admission: RE | Admit: 2021-11-03 | Discharge: 2021-11-03 | Disposition: A | Discharge status: Home / Self Care | Payer: Medicaid Other | Source: Ambulatory Visit | Attending: Obstetrics and Gynecology | Admitting: Obstetrics and Gynecology

## 2021-11-03 VITALS — BP 119/64 | HR 62

## 2021-11-03 VITALS — BP 117/74 | HR 69 | Wt 228.0 lb

## 2021-11-03 DIAGNOSIS — O4103X1 Oligohydramnios, third trimester, fetus 1: Secondary | ICD-10-CM

## 2021-11-03 DIAGNOSIS — O99212 Obesity complicating pregnancy, second trimester: Secondary | ICD-10-CM | POA: Diagnosis not present

## 2021-11-03 DIAGNOSIS — O0990 Supervision of high risk pregnancy, unspecified, unspecified trimester: Secondary | ICD-10-CM | POA: Diagnosis present

## 2021-11-03 DIAGNOSIS — O4103X0 Oligohydramnios, third trimester, not applicable or unspecified: Secondary | ICD-10-CM | POA: Diagnosis not present

## 2021-11-03 DIAGNOSIS — O09523 Supervision of elderly multigravida, third trimester: Secondary | ICD-10-CM

## 2021-11-03 DIAGNOSIS — O09522 Supervision of elderly multigravida, second trimester: Secondary | ICD-10-CM | POA: Diagnosis not present

## 2021-11-03 DIAGNOSIS — E669 Obesity, unspecified: Secondary | ICD-10-CM | POA: Diagnosis not present

## 2021-11-03 DIAGNOSIS — Z3A36 36 weeks gestation of pregnancy: Secondary | ICD-10-CM

## 2021-11-03 DIAGNOSIS — O09899 Supervision of other high risk pregnancies, unspecified trimester: Secondary | ICD-10-CM

## 2021-11-03 LAB — OB RESULTS CONSOLE GC/CHLAMYDIA: Gonorrhea: NEGATIVE

## 2021-11-03 NOTE — Progress Notes (Signed)
   PRENATAL VISIT NOTE  Subjective:  Erica Roth is a 40 y.o. [id number] at [redacted]w[redacted]d being seen today for ongoing prenatal care.  She is currently monitored for the following issues for this high-risk pregnancy and has Fibroids; History of preterm delivery, currently pregnant; Fibroid uterus; AMA (advanced maternal age) multigravida 66+; and Supervision of high risk pregnancy, antepartum on their problem list.  Patient reports no complaints.  Contractions: Irritability. Vag. Bleeding: None.  Movement: Present. Denies leaking of fluid.   The following portions of the patient's history were reviewed and updated as appropriate: allergies, current medications, past family history, past medical history, past social history, past surgical history and problem list.   Objective:   Vitals:   11/03/21 1118  BP: 117/74  Pulse: 69  Weight: 228 lb (103.4 kg)    Fetal Status: Fetal Heart Rate (bpm): 131 Fundal Height: 36 cm Movement: Present     General:  Alert, oriented and cooperative. Patient is in no acute distress.  Skin: Skin is warm and dry. No rash noted.   Cardiovascular: Normal heart rate noted  Respiratory: Normal respiratory effort, no problems with respiration noted  Abdomen: Soft, gravid, appropriate for gestational age.  Pain/Pressure: Absent     Pelvic: Cervical exam deferred        Extremities: Normal range of motion.  Edema: Trace  Mental Status: Normal mood and affect. Normal behavior. Normal judgment and thought content.   Assessment and Plan:  Pregnancy: [id number] at [redacted]w[redacted]d 1. Supervision of high risk pregnancy, antepartum Patient is doing well without complaint Cultures collected Patient declined cervical exam Follow up ultrasound today   2. Multigravida of advanced maternal age in third trimester   3. History of preterm delivery, currently pregnant   Preterm labor symptoms and general obstetric precautions including but not limited to vaginal bleeding, contractions,  leaking of fluid and fetal movement were reviewed in detail with the patient. Please refer to After Visit Summary for other counseling recommendations.   Return in about 1 week (around 11/10/2021) for in person, ROB, High risk.  Future Appointments  Date Time Provider [place]  11/03/2021  3:30 PM [facility] NURSE [facility]  11/03/2021  3:45 PM [facility] US2 [facility]    Mora Bellman, MD

## 2021-11-03 NOTE — Progress Notes (Signed)
Pt presents for ROB, GBS, GC/CT.  PHQ9 = 0 GAD7 = 0

## 2021-11-04 LAB — CERVICOVAGINAL ANCILLARY ONLY
Chlamydia: NEGATIVE
Comment: NEGATIVE
Comment: NORMAL
Neisseria Gonorrhea: NEGATIVE

## 2021-11-06 ENCOUNTER — Encounter: Payer: Self-pay | Admitting: Obstetrics and Gynecology

## 2021-11-06 DIAGNOSIS — O99820 Streptococcus B carrier state complicating pregnancy: Secondary | ICD-10-CM | POA: Insufficient documentation

## 2021-11-06 LAB — CULTURE, BETA STREP (GROUP B ONLY): Strep Gp B Culture: POSITIVE — AB

## 2021-11-10 ENCOUNTER — Encounter: Payer: Medicaid Other | Admitting: Obstetrics and Gynecology

## 2021-11-13 ENCOUNTER — Encounter (HOSPITAL_COMMUNITY): Payer: Self-pay | Admitting: Obstetrics and Gynecology

## 2021-11-13 ENCOUNTER — Other Ambulatory Visit: Payer: Self-pay

## 2021-11-13 ENCOUNTER — Inpatient Hospital Stay (HOSPITAL_COMMUNITY)
Admission: AD | Admit: 2021-11-13 | Discharge: 2021-11-16 | DRG: 807 | Disposition: A | Discharge status: Home / Self Care | Payer: Medicaid Other | Attending: Family Medicine | Admitting: Family Medicine

## 2021-11-13 DIAGNOSIS — Z3A37 37 weeks gestation of pregnancy: Secondary | ICD-10-CM

## 2021-11-13 DIAGNOSIS — O0990 Supervision of high risk pregnancy, unspecified, unspecified trimester: Secondary | ICD-10-CM

## 2021-11-13 DIAGNOSIS — Z20822 Contact with and (suspected) exposure to covid-19: Secondary | ICD-10-CM | POA: Diagnosis present

## 2021-11-13 DIAGNOSIS — O99824 Streptococcus B carrier state complicating childbirth: Secondary | ICD-10-CM | POA: Diagnosis present

## 2021-11-13 DIAGNOSIS — O4292 Full-term premature rupture of membranes, unspecified as to length of time between rupture and onset of labor: Principal | ICD-10-CM | POA: Diagnosis present

## 2021-11-13 DIAGNOSIS — O99820 Streptococcus B carrier state complicating pregnancy: Secondary | ICD-10-CM

## 2021-11-13 DIAGNOSIS — O26893 Other specified pregnancy related conditions, third trimester: Secondary | ICD-10-CM | POA: Diagnosis present

## 2021-11-13 DIAGNOSIS — O42013 Preterm premature rupture of membranes, onset of labor within 24 hours of rupture, third trimester: Secondary | ICD-10-CM | POA: Diagnosis not present

## 2021-11-13 DIAGNOSIS — O4290 Premature rupture of membranes, unspecified as to length of time between rupture and onset of labor, unspecified weeks of gestation: Secondary | ICD-10-CM | POA: Diagnosis present

## 2021-11-13 LAB — TYPE AND SCREEN
ABO/RH(D): O POS
Antibody Screen: NEGATIVE

## 2021-11-13 LAB — CBC
HCT: 38.3 % (ref 36.0–46.0)
Hemoglobin: 12.7 g/dL (ref 12.0–15.0)
MCH: 30 pg (ref 26.0–34.0)
MCHC: 33.2 g/dL (ref 30.0–36.0)
MCV: 90.3 fL (ref 80.0–100.0)
Platelets: 181 10*3/uL (ref 150–400)
RBC: 4.24 MIL/uL (ref 3.87–5.11)
RDW: 14.3 % (ref 11.5–15.5)
WBC: 8.3 10*3/uL (ref 4.0–10.5)
nRBC: 0 % (ref 0.0–0.2)

## 2021-11-13 LAB — RESP PANEL BY RT-PCR (FLU A&B, COVID) ARPGX2
Influenza A by PCR: NEGATIVE
Influenza B by PCR: NEGATIVE
SARS Coronavirus 2 by RT PCR: NEGATIVE

## 2021-11-13 LAB — POCT FERN TEST: POCT Fern Test: POSITIVE

## 2021-11-13 MED ORDER — TERBUTALINE SULFATE 1 MG/ML IJ SOLN
0.2500 mg | Freq: Once | INTRAMUSCULAR | Status: DC | PRN
  Filled 2021-11-13: qty 1

## 2021-11-13 MED ORDER — SODIUM CHLORIDE 0.9 % IV SOLN
5.0000 10*6.[IU] | Freq: Once | INTRAVENOUS | Status: AC
  Administered 2021-11-13: 12:00:00 5 10*6.[IU] via INTRAVENOUS
  Filled 2021-11-13: qty 5

## 2021-11-13 MED ORDER — FENTANYL CITRATE (PF) 100 MCG/2ML IJ SOLN: 50.0000 ug | INTRAMUSCULAR | Status: DC | PRN

## 2021-11-13 MED ORDER — OXYCODONE-ACETAMINOPHEN 5-325 MG PO TABS: 1.0000 | ORAL_TABLET | ORAL | Status: DC | PRN

## 2021-11-13 MED ORDER — SOD CITRATE-CITRIC ACID 500-334 MG/5ML PO SOLN: 30.0000 mL | ORAL | Status: DC | PRN

## 2021-11-13 MED ORDER — OXYCODONE-ACETAMINOPHEN 5-325 MG PO TABS: 2.0000 | ORAL_TABLET | ORAL | Status: DC | PRN

## 2021-11-13 MED ORDER — OXYTOCIN-SODIUM CHLORIDE 30-0.9 UT/500ML-% IV SOLN
2.5000 [IU]/h | INTRAVENOUS | Status: DC
  Filled 2021-11-13: qty 500

## 2021-11-13 MED ORDER — LIDOCAINE HCL (PF) 1 % IJ SOLN
30.0000 mL | INTRAMUSCULAR | Status: DC | PRN
  Filled 2021-11-13: qty 30

## 2021-11-13 MED ORDER — MISOPROSTOL 50MCG HALF TABLET
50.0000 ug | ORAL_TABLET | ORAL | Status: DC
  Administered 2021-11-13: 15:00:00 50 ug via ORAL
  Filled 2021-11-13 (×5): qty 1

## 2021-11-13 MED ORDER — ONDANSETRON HCL 4 MG/2ML IJ SOLN: 4.0000 mg | Freq: Four times a day (QID) | INTRAMUSCULAR | Status: DC | PRN

## 2021-11-13 MED ORDER — ACETAMINOPHEN 325 MG PO TABS
650.0000 mg | ORAL_TABLET | ORAL | Status: DC | PRN
  Administered 2021-11-14: 02:00:00 650 mg via ORAL
  Filled 2021-11-13: qty 2

## 2021-11-13 MED ORDER — LACTATED RINGERS IV SOLN: INTRAVENOUS | Status: DC

## 2021-11-13 MED ORDER — LACTATED RINGERS IV SOLN: 500.0000 mL | INTRAVENOUS | Status: DC | PRN

## 2021-11-13 MED ORDER — PENICILLIN G POT IN DEXTROSE 60000 UNIT/ML IV SOLN
3.0000 10*6.[IU] | INTRAVENOUS | Status: DC
  Administered 2021-11-13 (×2): 3 10*6.[IU] via INTRAVENOUS
  Filled 2021-11-13 (×7): qty 50

## 2021-11-13 MED ORDER — OXYTOCIN BOLUS FROM INFUSION: 333.0000 mL | Freq: Once | INTRAVENOUS | Status: DC

## 2021-11-13 NOTE — Progress Notes (Signed)
Pt informed that the ultrasound is considered a limited OB ultrasound and is not intended to be a complete ultrasound exam.  Patient also informed that the ultrasound is not being completed with the intent of assessing for fetal or placental anomalies or any pelvic abnormalities.  Explained that the purpose of todays ultrasound is to assess for  presentation.  Patient acknowledges the purpose of the exam and the limitations of the study.   [place] DNP, CNM  11/13/21  11:33 AM

## 2021-11-13 NOTE — H&P (Addendum)
Erica Roth is a 40 y.o. [id number] patient at [redacted]w[redacted]d presenting for SROM at 0630 this morning after an uncomplicated pregnancy. Is having mild cramping but not many contractions. She plans to breastfeed and will use natural family planning for birth control (which she has done successfully before and between all of her pregnancies). Strongly considering a circumcision for her son, wants to discuss with her husband. Received care at [facility], prenatal records reviewed.  OB History     Gravida  5   Para  3   Term  2   Preterm  1   AB  1   Living  3      SAB  1   IAB      Ectopic      Multiple  0   Live Births  3          Past Medical History:  Diagnosis Date   Fibroid    Preterm labor    Past Surgical History:  Procedure Laterality Date   NO PAST SURGERIES     Family History: family history includes Hypertension in her mother. Social History:  reports that she has never smoked. She has never used smokeless tobacco. She reports that she does not drink alcohol and does not use drugs.    Maternal Diabetes: No Genetic Screening: Declined Maternal Ultrasounds/Referrals: Normal Fetal Ultrasounds or other Referrals:  None Maternal Substance Abuse:  No Significant Maternal Medications:  None Significant Maternal Lab Results:  Group B Strep positive Other Comments:  None  Review of Systems  Constitutional:  Negative for fatigue and fever.  HENT:  Negative for congestion and sore throat.   Eyes:  Negative for visual disturbance.  Respiratory:  Negative for cough and shortness of breath.   Cardiovascular:  Negative for chest pain.  Gastrointestinal:  Negative for constipation, diarrhea, nausea and vomiting.  Genitourinary:  Negative for vaginal bleeding.  Musculoskeletal:  Negative for back pain.  Neurological:  Negative for dizziness, syncope and headaches.  Maternal Medical History:  Reason for admission: Rupture of membranes.  Nausea.  Contractions: Onset  was 6-12 hours ago.   Frequency: irregular.   Perceived severity is mild.   Fetal activity: Perceived fetal activity is normal.   Prenatal Complications - Diabetes: none.  Dilation: 2 Effacement (%): 30 Exam by:: Jaymes Graff RN Blood pressure 119/64, pulse 80, temperature 98.5 F (36.9 C), temperature source Oral, resp. rate 16, height 5\' 8"  (1.727 m), weight 223 lb (101.2 kg), last menstrual period 02/21/2021, SpO2 100 %, unknown if currently breastfeeding. Maternal Exam:  Uterine Assessment: Contraction strength is mild.  Contraction frequency is irregular.  Abdomen: Patient reports no abdominal tenderness. Fetal presentation: vertex RN unable to ascertain presentation, Leopold's showed baby vertex but initial bedside U/S to confirm had fetal head high in pelvis to maternal left (almost oblique lie). Baby moved again into vertex but ballotable, so abdominal binder applied. Introitus: Normal vulva. Normal vagina.  Amniotic fluid character: clear. Pelvis: adequate for delivery.   Cervix: Cervix evaluated by sterile speculum exam.     Fetal Exam Fetal Monitor Review: Mode: ultrasound.   Baseline rate: 155.  Variability: moderate (6-25 bpm).   Pattern: accelerations present and no decelerations.   Fetal State Assessment: Category I - tracings are normal.  Physical Exam Vitals and nursing note reviewed.  Constitutional:      General: She is not in acute distress.    Appearance: Normal appearance. She is normal weight. She is not ill-appearing.  HENT:  Head: Normocephalic.     Mouth/Throat:     Mouth: Mucous membranes are moist.  Eyes:     Pupils: Pupils are equal, round, and reactive to light.  Cardiovascular:     Rate and Rhythm: Normal rate.     Pulses: Normal pulses.  Pulmonary:     Effort: Pulmonary effort is normal.  Abdominal:     Palpations: Abdomen is soft.  Genitourinary:    General: Normal vulva.  Musculoskeletal:        General: Normal range of motion.   Skin:    General: Skin is warm and dry.     Capillary Refill: Capillary refill takes less than 2 seconds.  Neurological:     Mental Status: She is alert and oriented to person, place, and time.  Psychiatric:        Mood and Affect: Mood normal.        Behavior: Behavior normal.        Thought Content: Thought content normal.        Judgment: Judgment normal.    Prenatal labs: ABO, Rh: --/--/O POS (12/22 1132) Antibody: NEG (12/22 1132) Rubella: 6.77 (06/09 1614) RPR: Non Reactive (10/24 1005)  HBsAg: Negative (06/09 1614)  HIV: Non Reactive (10/24 1005)  GBS: Positive/-- (12/12 1145)   Assessment/Plan: [id number] at [redacted]w[redacted]d  Prelabor rupture of membranes with reactive NST  #Labor: Latent, abdominal binder on, will start IOL with 50mg  buccal cytotec and ambulation #Pain: Planning unmedicated delivery (has done so 3x prior) #FWB: Cat 1 #ID:  GBS positive, PCN ordered #MOF: Breast #MOC: NFP #Circ:  Likely yes  Erica Roth 11/13/2021, 3:22 PM

## 2021-11-13 NOTE — Progress Notes (Signed)
Patient Vitals for the past 4 hrs:  BP Temp Temp src Pulse Resp  11/13/21 2021 (!) 106/56 98.1 F (36.7 C) Axillary 69 18   FHR Cat 1.  Ctx q 1.5-2 minutes, getting stronger.  Cooks catheter inserted and inflated w/80cc H20. If ctx space out, will give another cytotec.  Pt would like to avoid pitocin if possible.

## 2021-11-13 NOTE — MAU Note (Signed)
...  Erica Roth is a 40 y.o. at [redacted]w[redacted]d here in MAU reporting: Water broke this morning at 0630. Clear fluids, no odors. +FM. No VB. Denies CTX.  Pain score: Denies pain.  Lab orders placed from triage:  MAU Labor Eval

## 2021-11-13 NOTE — Plan of Care (Signed)
°  Problem: Education: Goal: Knowledge of Childbirth will improve Outcome: Progressing Goal: Ability to make informed decisions regarding treatment and plan of care will improve Outcome: Progressing Goal: Ability to state and carry out methods to decrease the pain will improve Outcome: Progressing Goal: Individualized Educational Video(s) Outcome: Progressing   Problem: Coping: Goal: Ability to verbalize concerns and feelings about labor and delivery will improve Outcome: Progressing   Problem: Life Cycle: Goal: Ability to make normal progression through stages of labor will improve Outcome: Progressing Goal: Ability to effectively push during vaginal delivery will improve Outcome: Progressing   Problem: Role Relationship: Goal: Will demonstrate positive interactions with the child Outcome: Progressing   Problem: Safety: Goal: Risk of complications during labor and delivery will decrease Outcome: Progressing   Problem: Pain Management: Goal: Relief or control of pain from uterine contractions will improve Outcome: Progressing   

## 2021-11-14 ENCOUNTER — Encounter (HOSPITAL_COMMUNITY): Payer: Self-pay | Admitting: Family Medicine

## 2021-11-14 ENCOUNTER — Encounter: Payer: Medicaid Other | Admitting: Obstetrics and Gynecology

## 2021-11-14 DIAGNOSIS — O99824 Streptococcus B carrier state complicating childbirth: Secondary | ICD-10-CM

## 2021-11-14 DIAGNOSIS — Z3A37 37 weeks gestation of pregnancy: Secondary | ICD-10-CM

## 2021-11-14 DIAGNOSIS — O42013 Preterm premature rupture of membranes, onset of labor within 24 hours of rupture, third trimester: Secondary | ICD-10-CM

## 2021-11-14 LAB — CBC
HCT: 39.8 % (ref 36.0–46.0)
Hemoglobin: 13.2 g/dL (ref 12.0–15.0)
MCH: 30.1 pg (ref 26.0–34.0)
MCHC: 33.2 g/dL (ref 30.0–36.0)
MCV: 90.9 fL (ref 80.0–100.0)
Platelets: 186 10*3/uL (ref 150–400)
RBC: 4.38 MIL/uL (ref 3.87–5.11)
RDW: 14.1 % (ref 11.5–15.5)
WBC: 13.1 10*3/uL — ABNORMAL HIGH (ref 4.0–10.5)
nRBC: 0 % (ref 0.0–0.2)

## 2021-11-14 LAB — RPR: RPR Ser Ql: NONREACTIVE

## 2021-11-14 MED ORDER — COCONUT OIL OIL: 1.0000 "application " | TOPICAL_OIL | Status: DC | PRN

## 2021-11-14 MED ORDER — DIBUCAINE (PERIANAL) 1 % EX OINT: 1.0000 "application " | TOPICAL_OINTMENT | CUTANEOUS | Status: DC | PRN

## 2021-11-14 MED ORDER — TETANUS-DIPHTH-ACELL PERTUSSIS 5-2.5-18.5 LF-MCG/0.5 IM SUSY: 0.5000 mL | PREFILLED_SYRINGE | Freq: Once | INTRAMUSCULAR | Status: DC

## 2021-11-14 MED ORDER — WITCH HAZEL-GLYCERIN EX PADS: 1.0000 "application " | MEDICATED_PAD | CUTANEOUS | Status: DC | PRN

## 2021-11-14 MED ORDER — DIPHENHYDRAMINE HCL 25 MG PO CAPS: 25.0000 mg | ORAL_CAPSULE | Freq: Four times a day (QID) | ORAL | Status: DC | PRN

## 2021-11-14 MED ORDER — SENNOSIDES-DOCUSATE SODIUM 8.6-50 MG PO TABS
2.0000 | ORAL_TABLET | Freq: Every day | ORAL | Status: DC
  Administered 2021-11-15: 12:00:00 2 via ORAL
  Filled 2021-11-14 (×2): qty 2

## 2021-11-14 MED ORDER — MEASLES, MUMPS & RUBELLA VAC IJ SOLR: 0.5000 mL | Freq: Once | INTRAMUSCULAR | Status: DC

## 2021-11-14 MED ORDER — PRENATAL MULTIVITAMIN CH
1.0000 | ORAL_TABLET | Freq: Every day | ORAL | Status: DC
  Administered 2021-11-15: 12:00:00 1 via ORAL
  Filled 2021-11-14 (×3): qty 1

## 2021-11-14 MED ORDER — IBUPROFEN 600 MG PO TABS
600.0000 mg | ORAL_TABLET | Freq: Four times a day (QID) | ORAL | Status: DC
  Administered 2021-11-14 – 2021-11-15 (×5): 600 mg via ORAL
  Filled 2021-11-14 (×8): qty 1

## 2021-11-14 MED ORDER — ONDANSETRON HCL 4 MG PO TABS: 4.0000 mg | ORAL_TABLET | ORAL | Status: DC | PRN

## 2021-11-14 MED ORDER — MEDROXYPROGESTERONE ACETATE 150 MG/ML IM SUSP: 150.0000 mg | INTRAMUSCULAR | Status: DC | PRN

## 2021-11-14 MED ORDER — BENZOCAINE-MENTHOL 20-0.5 % EX AERO
1.0000 "application " | INHALATION_SPRAY | CUTANEOUS | Status: DC | PRN
  Filled 2021-11-14: qty 56

## 2021-11-14 MED ORDER — ONDANSETRON HCL 4 MG/2ML IJ SOLN: 4.0000 mg | INTRAMUSCULAR | Status: DC | PRN

## 2021-11-14 MED ORDER — ACETAMINOPHEN 325 MG PO TABS: 650.0000 mg | ORAL_TABLET | ORAL | Status: DC | PRN

## 2021-11-14 MED ORDER — SIMETHICONE 80 MG PO CHEW: 80.0000 mg | CHEWABLE_TABLET | ORAL | Status: DC | PRN

## 2021-11-14 NOTE — Lactation Note (Addendum)
This note was copied from a baby's chart. Lactation Consultation Note  Patient Name: Erica Roth [id number] Date: 11/14/2021 Reason for consult: Initial assessment;Early term 37-38.6wks (Per mom, she briefly BF her other 3 children for only 1 to 2 months due to hx of low milk supply. Mom current feeding choice is breast and suppleementing with formula.) Age:38 hours Mom declined interpreter mom speaks [place] Mom latched infant on her left breast using the cross cradle hold, mom was sitting in a chair and felt comfortable, mom used pillow support with latch and did not need latch assistance from [place]. Infant latched with depth and actively breastfeed for 13 minutes on mom's left breast, as LC left the room, mom switch infant to her right breast using the same position " cross cradle hold". LC discussed breastfeeding stimulation techniques to keep infant awake while breastfeeding such as:  breastfeeding infant skin to skin, breast compressions, gently stroking infant's neck, shoulder and hands, talking to infant while breastfeeding . See flow sheet: Per RN, infant last breastfeed at 24 am and mom did not offer formula that was in her room. Mom made aware of O/P services, breastfeeding support groups, community resources, and our phone # for post-discharge questions.   Mom's plan: 1- Mom will breastfeeding infant according to feeding cues, 8 to 12+ or more times within 24 hours, skin to skin. 2- Mom knows if infant is past 3 1/2 hours to wake infant , LC discussed infant small tummy size and infant will breastfeed  frequently don't make infant wait to feed.  3- Mom's plan going forward is to latch infant first at every feeding and afterwards supplement infant with formula ( this is her choice), mom was given BF supplemental sheet earlier by RN. 4- Mom knows to call RN/LC if she has any breastfeeding questions, concerns or need assistance with latching infant at the breast.  Maternal Data Has patient  been taught Hand Expression?: Yes Does the patient have breastfeeding experience prior to this delivery?: Yes How long did the patient breastfeed?: Breastfeed all 3 previous children for 1 to 2 months.  Feeding Mother's Current Feeding Choice: Breast Milk and Formula  LATCH Score Latch: Grasps breast easily, tongue down, lips flanged, rhythmical sucking.  Audible Swallowing: A few with stimulation  Type of Nipple: Everted at rest and after stimulation  Comfort (Breast/Nipple): Soft / non-tender  Hold (Positioning): No assistance needed to correctly position infant at breast.  LATCH Score: 9   Lactation Tools Discussed/Used    Interventions Interventions: Breast feeding basics reviewed;Assisted with latch;Skin to skin;Breast compression;Adjust position;Support pillows;Position options;Education;LC Services brochure  Discharge    Consult Status Consult Status: Follow-up Date: 11/15/21 Follow-up type: In-patient    Vicente Serene 11/14/2021, 7:41 PM

## 2021-11-14 NOTE — Progress Notes (Addendum)
OB/GYN Faculty Practice Delivery Note  Erica Roth is a 40 y.o. [id number] s/p SVD at [redacted]w[redacted]d. She was admitted for SROM.   ROM: 18h 17m with clear fluid GBS Status: Positive Maximum Maternal Temperature: 98.5 F  Delivery Date/Time: 11/14/2021 at 0125 Delivery: Called to room and patient was complete and pushing. Head delivered LOA. No nuchal cord present. Shoulder and body delivered in usual fashion. Infant with spontaneous cry, placed on mother's abdomen, dried and stimulated. Cord clamped x 2 after 1-minute delay, and cut by FOB. Cord blood drawn. Placenta delivered spontaneously with gentle cord traction. Fundus firm with massage. Patient declined Pitocin. Labia, perineum, vagina, and cervix inspected inspected with 1st degree perineal laceration, hemostatic, repair declined.  Placenta: Intact- 3VC; Sent to L&D Complications: None Lacerations: 1st degree perineal, no repair required- hemostatic EBL: 75 cc Analgesia: None  Infant: Viable   APGARs 8 &9   weight pending  Orvis Brill, D.O. [place], PGY-1 11/14/2021, 2:05 AM   The above was performed under my direct supervision and guidance.

## 2021-11-14 NOTE — Discharge Summary (Signed)
Postpartum Discharge Summary  Date of Service updated     Patient Name: Erica Roth DOB: 1981-05-13 MRN: [id number]  Date of admission: 11/13/2021 Delivery date:11/14/2021  Delivering provider: Christin Fudge  Date of discharge: 11/16/2021  Admitting diagnosis: ROM (rupture of membranes), premature [O42.90] Intrauterine pregnancy: [redacted]w[redacted]d     Secondary diagnosis:  Principal Problem:   ROM (rupture of membranes), premature Active Problems:   Indication for care in labor or delivery  Additional problems:     Discharge diagnosis: Term Pregnancy Delivered                                              Post partum procedures: Augmentation: Cytotec and IP Foley Complications: None  Hospital course: Induction of Labor With Vaginal Delivery   40 y.o. yo [phone] at [redacted]w[redacted]d was admitted to the hospital 11/13/2021 for induction of labor.  Indication for induction: PROM.  Patient had an uncomplicated labor course as follows: Membrane Rupture Time/Date: 6:30 AM ,11/13/2021   Delivery Method:Vaginal, Spontaneous  Episiotomy: None  Lacerations:  1st degree;Vaginal  Details of delivery can be found in separate delivery note.  Patient had a routine postpartum course. Patient is discharged home 11/16/21.  Newborn Data: Birth date:11/14/2021  Birth time:1:25 AM  Gender:Female  Living status:Living  Apgars:8 ,9  Weight:2940 g   Magnesium Sulfate received: No BMZ received: No Rhophylac:N/A MMR:N/A T-DaP: delcined Flu: No Transfusion:No  Physical exam  Vitals:   11/15/21 0500 11/15/21 1630 11/15/21 2011 11/16/21 0619  BP: 97/63 118/71 115/61 106/74  Pulse: (!) 57 93 67 75  Resp: $Remo'18 18 18 18  'Ahzkb$ Temp: 97.6 F (36.4 C) 97.8 F (36.6 C) 98.1 F (36.7 C) 98.1 F (36.7 C)  TempSrc: Oral Oral Oral Oral  SpO2:  100% 100% 98%  Weight:      Height:       General: alert, cooperative, and no distress Lochia: appropriate Uterine Fundus: firm Incision: N/A DVT Evaluation:  No evidence of DVT seen on physical exam. No cords or calf tenderness. Labs: Lab Results  Component Value Date   WBC 13.1 (H) 11/14/2021   HGB 13.2 11/14/2021   HCT 39.8 11/14/2021   MCV 90.9 11/14/2021   PLT 186 11/14/2021   CMP Latest Ref Rng & Units 01/20/2016  Glucose mg/dL 90  BUN 6 - 20 mg/dL -  Creatinine 0.44 - 1.00 mg/dL -  Sodium 135 - 145 mmol/L -  Potassium 3.5 - 5.1 mmol/L -  Chloride 101 - 111 mmol/L -  CO2 22 - 32 mmol/L -  Calcium 8.9 - 10.3 mg/dL -  Total Protein 6.5 - 8.1 g/dL -  Total Bilirubin 0.3 - 1.2 mg/dL -  Alkaline Phos 38 - 126 U/L -  AST 15 - 41 U/L -  ALT 14 - 54 U/L -   Edinburgh Score: Edinburgh Postnatal Depression Scale Screening Tool 11/14/2021  I have been able to laugh and see the funny side of things. 0  I have looked forward with enjoyment to things. 0  I have blamed myself unnecessarily when things went wrong. 3  I have been anxious or worried for no good reason. 0  I have felt scared or panicky for no good reason. 0  Things have been getting on top of me. 0  I have been so unhappy that I have had difficulty sleeping. 0  I have felt sad or miserable. 0  I have been so unhappy that I have been crying. 0  The thought of harming myself has occurred to me. 0  Edinburgh Postnatal Depression Scale Total 3     After visit meds:  Allergies as of 11/16/2021   No Known Allergies      Medication List     STOP taking these medications    aspirin EC 81 MG tablet       TAKE these medications    ibuprofen 600 MG tablet Commonly known as: ADVIL Take 1 tablet (600 mg total) by mouth every 6 (six) hours.   PrePLUS 27-1 MG Tabs Take 1 tablet by mouth daily.         Discharge home in stable condition Infant Feeding: Breast Infant Disposition:home with mother Discharge instruction: per After Visit Summary and Postpartum booklet. Activity: Advance as tolerated. Pelvic rest for 6 weeks.  Diet: routine diet Future  Appointments: Future Appointments  Date Time Provider [place]  12/12/2021  8:15 AM Shelly Bombard, MD [place] None   Follow up Visit:  [place]. Go on 12/12/2021.   Specialty: Obstetrics and Gynecology Why: for postpartum checkup Contact information: [address], [place] [phone]                 Please schedule this patient for a In person postpartum visit in 4 weeks with the following provider: Any provider. Additional Postpartum F/U:  Low risk pregnancy complicated by:  Delivery mode:  Vaginal, Spontaneous  Anticipated Birth Control:   NFP   11/16/2021 Radene Gunning, MD

## 2021-11-14 NOTE — Lactation Note (Signed)
This note was copied from a baby's chart. Lactation Consultation Note  Patient Name: Boy Jazell Rosenau [id number] Date: 11/14/2021   Age:38 hours Per RNLanelle Bal) mom and infant is sleeping at this time, LC did not enter room. LC will attempt to follow up with mom and baby later tonight. Maternal Data    Feeding    LATCH Score                    Lactation Tools Discussed/Used    Interventions    Discharge    Consult Status      Vicente Serene 11/14/2021, 6:23 PM

## 2021-11-14 NOTE — Progress Notes (Addendum)
Labor Progress Note Erica Roth is a 40 y.o. [id number] at [redacted]w[redacted]d presented for SROM.  S: Feeling contractions intensely, able to breathe through them.  O:  BP 123/74    Pulse 92    Temp 98.1 F (36.7 C) (Axillary)    Resp 18    Ht 5\' 8"  (1.727 m)    Wt 101.2 kg    LMP 02/21/2021    SpO2 100%    BMI 33.91 kg/m   FHT: FHR 145 bpm, moderate variability, + accels, early decels  CVE: Dilation: 4.5 Effacement (%): 80 Station: -2 Exam by:: Linton Rump, RN   A&P: 40 y.o. [id number] [redacted]w[redacted]d  #Labor: Progressing well. FB out at 2335, now 6.5cm and feeling ctx intensely. Expectant management for now- anticipate SVD soon. #Pain: Per request #FWB: Cat 1 #GBS positive    Tequan Redmon, DO 12:28 AM

## 2021-11-15 ENCOUNTER — Encounter (HOSPITAL_COMMUNITY): Payer: Self-pay | Admitting: Family Medicine

## 2021-11-15 NOTE — Lactation Note (Signed)
This note was copied from a baby's chart. Lactation Consultation Note  Patient Name: Erica Roth [id number] Date: 11/15/2021   Age:38 hours   Infant latched with ease; swallows verified by cervical auscultation. Infant does require some stimulation to keep suckling at times.   Nipples are intact; Mom feels that breasts are heavier today than yesterday, but was unsure if breasts were softer after feeding.   Mom does not have a pump at home. I provided a manual pump with size 21 flanges in case Mom ever needs one. Mom has offered little formula while here.   Erica Roth [facility] 11/15/2021, 10:42 AM

## 2021-11-15 NOTE — Plan of Care (Signed)
Problem: Education: Goal: Knowledge of General Education information will improve Description: Including pain rating scale, medication(s)/side effects and non-pharmacologic comfort measures Outcome: Completed/Met   Problem: Clinical Measurements: Goal: Ability to maintain clinical measurements within normal limits will improve Outcome: Completed/Met Goal: Will remain free from infection Outcome: Completed/Met Goal: Diagnostic test results will improve Outcome: Completed/Met Goal: Respiratory complications will improve Outcome: Completed/Met Goal: Cardiovascular complication will be avoided Outcome: Completed/Met   Problem: Activity: Goal: Risk for activity intolerance will decrease Outcome: Completed/Met   Problem: Nutrition: Goal: Adequate nutrition will be maintained Outcome: Completed/Met   Problem: Coping: Goal: Level of anxiety will decrease Outcome: Completed/Met   Problem: Elimination: Goal: Will not experience complications related to bowel motility Outcome: Completed/Met Goal: Will not experience complications related to urinary retention Outcome: Completed/Met   Problem: Pain Managment: Goal: General experience of comfort will improve Outcome: Completed/Met   Problem: Safety: Goal: Ability to remain free from injury will improve Outcome: Completed/Met   Problem: Skin Integrity: Goal: Risk for impaired skin integrity will decrease Outcome: Completed/Met   Problem: Education: Goal: Knowledge of condition will improve Outcome: Completed/Met   Problem: Activity: Goal: Will verbalize the importance of balancing activity with adequate rest periods Outcome: Completed/Met   Problem: Coping: Goal: Ability to identify and utilize available resources and services will improve Outcome: Completed/Met   Problem: Life Cycle: Goal: Chance of risk for complications during the postpartum period will decrease Outcome: Completed/Met   Problem: Role  Relationship: Goal: Ability to demonstrate positive interaction with newborn will improve Outcome: Completed/Met   Problem: Skin Integrity: Goal: Demonstration of wound healing without infection will improve Outcome: Completed/Met

## 2021-11-15 NOTE — Progress Notes (Signed)
Post Partum Day 1 Subjective: no complaints, up ad lib, voiding and tolerating PO, small lochia, plans to breastfeed, plans to bottle feed, natural family planning (NFP).  States that pediatrician does not want baby DC'd today d/t prolonged ROM.   Objective: Blood pressure 97/63, pulse (!) 57, temperature 97.6 F (36.4 C), temperature source Oral, resp. rate 18, height 5\' 8"  (1.727 m), weight 101.2 kg, last menstrual period 02/21/2021, SpO2 99 %, unknown if currently breastfeeding.  Physical Exam:  General: alert, cooperative and no distress Lochia:normal flow Chest: CTAB Heart: RRR no m/r/g Abdomen: +BS, soft, nontender,  Uterine Fundus: firm DVT Evaluation: No evidence of DVT seen on physical exam. Extremities: trace edema  Recent Labs    11/13/21 1132 11/14/21 0505  HGB 12.7 13.2  HCT 38.3 39.8    Assessment/Plan: Plan for discharge tomorrow, Lactation consult, and Circumcision prior to discharge   LOS: 2 days   Christin Fudge 11/15/2021, 7:23 AM

## 2021-11-16 MED ORDER — IBUPROFEN 600 MG PO TABS: 600.0000 mg | ORAL_TABLET | Freq: Four times a day (QID) | ORAL | 0 refills | Status: DC

## 2021-11-27 ENCOUNTER — Telehealth (HOSPITAL_COMMUNITY): Payer: Self-pay | Admitting: *Deleted

## 2021-11-27 NOTE — Telephone Encounter (Signed)
Phone voicemail message left per interpreter to return nurse call.  Odis Hollingshead, RN 11-27-2021 at 9:58am

## 2021-12-12 ENCOUNTER — Ambulatory Visit: Payer: Medicaid Other | Admitting: Obstetrics

## 2022-01-12 ENCOUNTER — Ambulatory Visit: Payer: Medicaid Other | Admitting: Family Medicine

## 2023-07-02 ENCOUNTER — Encounter (HOSPITAL_COMMUNITY): Payer: Self-pay | Admitting: Obstetrics and Gynecology

## 2023-07-02 ENCOUNTER — Inpatient Hospital Stay (HOSPITAL_COMMUNITY)
Admission: AD | Admit: 2023-07-02 | Discharge: 2023-07-02 | Disposition: A | Discharge status: Home / Self Care | Payer: Self-pay | Attending: Obstetrics and Gynecology | Admitting: Obstetrics and Gynecology

## 2023-07-02 ENCOUNTER — Inpatient Hospital Stay (HOSPITAL_COMMUNITY): Payer: Self-pay

## 2023-07-02 DIAGNOSIS — Z3A09 9 weeks gestation of pregnancy: Secondary | ICD-10-CM | POA: Insufficient documentation

## 2023-07-02 DIAGNOSIS — O200 Threatened abortion: Secondary | ICD-10-CM | POA: Insufficient documentation

## 2023-07-02 DIAGNOSIS — D259 Leiomyoma of uterus, unspecified: Secondary | ICD-10-CM | POA: Insufficient documentation

## 2023-07-02 DIAGNOSIS — O3411 Maternal care for benign tumor of corpus uteri, first trimester: Secondary | ICD-10-CM | POA: Insufficient documentation

## 2023-07-02 LAB — URINALYSIS, ROUTINE W REFLEX MICROSCOPIC
Bilirubin Urine: NEGATIVE
Glucose, UA: NEGATIVE mg/dL
Ketones, ur: NEGATIVE mg/dL
Nitrite: NEGATIVE
Protein, ur: NEGATIVE mg/dL
Specific Gravity, Urine: 1.006 (ref 1.005–1.030)
pH: 5 (ref 5.0–8.0)

## 2023-07-02 LAB — CBC WITH DIFFERENTIAL/PLATELET
Abs Immature Granulocytes: 0.01 10*3/uL (ref 0.00–0.07)
Basophils Absolute: 0.1 10*3/uL (ref 0.0–0.1)
Basophils Relative: 1 %
Eosinophils Absolute: 0 10*3/uL (ref 0.0–0.5)
Eosinophils Relative: 0 %
HCT: 36.8 % (ref 36.0–46.0)
Hemoglobin: 12.1 g/dL (ref 12.0–15.0)
Immature Granulocytes: 0 %
Lymphocytes Relative: 31 %
Lymphs Abs: 2.5 10*3/uL (ref 0.7–4.0)
MCH: 28.5 pg (ref 26.0–34.0)
MCHC: 32.9 g/dL (ref 30.0–36.0)
MCV: 86.6 fL (ref 80.0–100.0)
Monocytes Absolute: 0.5 10*3/uL (ref 0.1–1.0)
Monocytes Relative: 6 %
Neutro Abs: 5.2 10*3/uL (ref 1.7–7.7)
Neutrophils Relative %: 62 %
Platelets: 231 10*3/uL (ref 150–400)
RBC: 4.25 MIL/uL (ref 3.87–5.11)
RDW: 13.7 % (ref 11.5–15.5)
WBC: 8.3 10*3/uL (ref 4.0–10.5)
nRBC: 0 % (ref 0.0–0.2)

## 2023-07-02 LAB — COMPREHENSIVE METABOLIC PANEL
ALT: 10 U/L (ref 0–44)
AST: 17 U/L (ref 15–41)
Albumin: 3.5 g/dL (ref 3.5–5.0)
Alkaline Phosphatase: 63 U/L (ref 38–126)
Anion gap: 8 (ref 5–15)
BUN: 11 mg/dL (ref 6–20)
CO2: 23 mmol/L (ref 22–32)
Calcium: 8.8 mg/dL — ABNORMAL LOW (ref 8.9–10.3)
Chloride: 104 mmol/L (ref 98–111)
Creatinine, Ser: 0.75 mg/dL (ref 0.44–1.00)
GFR, Estimated: >60 mL/min (ref >60)
Glucose, Bld: 112 mg/dL — ABNORMAL HIGH (ref 70–99)
Potassium: 3.5 mmol/L (ref 3.5–5.1)
Sodium: 135 mmol/L (ref 135–145)
Total Bilirubin: 0.5 mg/dL (ref 0.3–1.2)
Total Protein: 7.8 g/dL (ref 6.5–8.1)

## 2023-07-02 LAB — HIV ANTIBODY (ROUTINE TESTING W REFLEX): HIV Screen 4th Generation wRfx: NONREACTIVE

## 2023-07-02 LAB — WET PREP, GENITAL
Sperm: NONE SEEN
Trich, Wet Prep: NONE SEEN
WBC, Wet Prep HPF POC: >=10 — AB (ref <10)
Yeast Wet Prep HPF POC: NONE SEEN

## 2023-07-02 LAB — HCG, QUANTITATIVE, PREGNANCY: hCG, Beta Chain, Quant, S: 13786 m[IU]/mL — ABNORMAL HIGH (ref <5)

## 2023-07-02 LAB — ABO/RH: ABO/RH(D): O POS

## 2023-07-02 NOTE — MAU Note (Signed)
...  Erica Roth is a 42 y.o. at [redacted]w[redacted]d here in MAU reporting: Vaginal spotting since 8/5 that is occasional. She reports the spotting initially started as a light pink color and is now red. Denies vaginal itching or vaginal odors. Denies current pain. Reports occasional lower back pain for the past few weeks. Denies recent IC.  LMP: 04/26/2023 Onset of complaint: 8/5 Pain score: Denies pain.   FHT: n/a Lab orders placed from triage: UA

## 2023-07-02 NOTE — MAU Provider Note (Signed)
History     CSN: [id number]  Arrival date and time: 07/02/23 1741   Event Date/Time   First Provider Initiated Contact with Patient 07/02/23 2234      Chief Complaint  Patient presents with   Vaginal Bleeding   Erica Roth is a 42 y.o. [id number] at [redacted]w[redacted]d by Definite LMP of April 26, 2023.  She presents today for vaginal spotting. She reports that she has been having "just a little bit" since Aug 5th.  She states it was initially pink and is now red.  She reports passing clots, upon arrival, the size of quarters x 2. She denies cramping.  She reports some white discharge, but denies odor, itching, or irritation.    OB History     Gravida  6   Para  4   Term  3   Preterm  1   AB  1   Living  4      SAB  1   IAB      Ectopic      Multiple  0   Live Births  4           Past Medical History:  Diagnosis Date   Fibroid    Preterm labor     Past Surgical History:  Procedure Laterality Date   NO PAST SURGERIES      Family History  Problem Relation Age of Onset   Hypertension Mother     Social History   Tobacco Use   Smoking status: Never   Smokeless tobacco: Never  Vaping Use   Vaping status: Never Used  Substance Use Topics   Alcohol use: No   Drug use: No    Allergies: No Known Allergies  Medications Prior to Admission  Medication Sig Dispense Refill Last Dose   ibuprofen (ADVIL) 600 MG tablet Take 1 tablet (600 mg total) by mouth every 6 (six) hours. 30 tablet 0    Prenatal Vit-Fe Fumarate-FA (PREPLUS) 27-1 MG TABS Take 1 tablet by mouth daily. 30 tablet 13     Review of Systems  Genitourinary:  Positive for vaginal bleeding. Negative for difficulty urinating, dysuria and vaginal discharge.   Physical Exam   Blood pressure 121/73, pulse 68, temperature 98.1 F (36.7 C), temperature source Oral, resp. rate 16, height 5\' 8"  (1.727 m), weight 107.8 kg, last menstrual period 04/26/2023, SpO2 100%, unknown if currently  breastfeeding.  Physical Exam Vitals reviewed.  Constitutional:      General: She is not in acute distress.    Appearance: Normal appearance.  HENT:     Head: Normocephalic and atraumatic.  Eyes:     Conjunctiva/sclera: Conjunctivae normal.  Cardiovascular:     Rate and Rhythm: Normal rate.  Pulmonary:     Effort: Pulmonary effort is normal. No respiratory distress.  Genitourinary:    Comments: Pad with scant light pink blood. Wiping with scant light pink mucoid blood.  Musculoskeletal:        General: Normal range of motion.     Cervical back: Normal range of motion.  Neurological:     Mental Status: She is alert and oriented to person, place, and time.  Psychiatric:        Mood and Affect: Mood normal.        Behavior: Behavior normal.     MAU Course  Procedures Results for orders placed or performed during the hospital encounter of 07/02/23 (from the past 24 hour(s))  Urinalysis, Routine w reflex microscopic -Urine, Clean Catch  Status: Abnormal   Collection Time: 07/02/23  6:43 PM  Result Value Ref Range   Color, Urine YELLOW YELLOW   APPearance CLEAR CLEAR   Specific Gravity, Urine 1.006 1.005 - 1.030   pH 5.0 5.0 - 8.0   Glucose, UA NEGATIVE NEGATIVE mg/dL   Hgb urine dipstick MODERATE (A) NEGATIVE   Bilirubin Urine NEGATIVE NEGATIVE   Ketones, ur NEGATIVE NEGATIVE mg/dL   Protein, ur NEGATIVE NEGATIVE mg/dL   Nitrite NEGATIVE NEGATIVE   Leukocytes,Ua TRACE (A) NEGATIVE   RBC / HPF 0-5 0 - 5 RBC/hpf   WBC, UA 0-5 0 - 5 WBC/hpf   Bacteria, UA RARE (A) NONE SEEN   Squamous Epithelial / HPF 0-5 0 - 5 /HPF  CBC with Differential/Platelet     Status: None   Collection Time: 07/02/23  7:42 PM  Result Value Ref Range   WBC 8.3 4.0 - 10.5 K/uL   RBC 4.25 3.87 - 5.11 MIL/uL   Hemoglobin 12.1 12.0 - 15.0 g/dL   HCT 65.7 84.6 - 96.2 %   MCV 86.6 80.0 - 100.0 fL   MCH 28.5 26.0 - 34.0 pg   MCHC 32.9 30.0 - 36.0 g/dL   RDW 95.2 84.1 - 32.4 %   Platelets 231 150  - 400 K/uL   nRBC 0.0 0.0 - 0.2 %   Neutrophils Relative % 62 %   Neutro Abs 5.2 1.7 - 7.7 K/uL   Lymphocytes Relative 31 %   Lymphs Abs 2.5 0.7 - 4.0 K/uL   Monocytes Relative 6 %   Monocytes Absolute 0.5 0.1 - 1.0 K/uL   Eosinophils Relative 0 %   Eosinophils Absolute 0.0 0.0 - 0.5 K/uL   Basophils Relative 1 %   Basophils Absolute 0.1 0.0 - 0.1 K/uL   Immature Granulocytes 0 %   Abs Immature Granulocytes 0.01 0.00 - 0.07 K/uL  Comprehensive metabolic panel     Status: Abnormal   Collection Time: 07/02/23  7:42 PM  Result Value Ref Range   Sodium 135 135 - 145 mmol/L   Potassium 3.5 3.5 - 5.1 mmol/L   Chloride 104 98 - 111 mmol/L   CO2 23 22 - 32 mmol/L   Glucose, Bld 112 (H) 70 - 99 mg/dL   BUN 11 6 - 20 mg/dL   Creatinine, Ser 4.01 0.44 - 1.00 mg/dL   Calcium 8.8 (L) 8.9 - 10.3 mg/dL   Total Protein 7.8 6.5 - 8.1 g/dL   Albumin 3.5 3.5 - 5.0 g/dL   AST 17 15 - 41 U/L   ALT 10 0 - 44 U/L   Alkaline Phosphatase 63 38 - 126 U/L   Total Bilirubin 0.5 0.3 - 1.2 mg/dL   GFR, Estimated >02 >72 mL/min   Anion gap 8 5 - 15  ABO/Rh     Status: None   Collection Time: 07/02/23  7:42 PM  Result Value Ref Range   ABO/RH(D) O POS    No rh immune globuloin      NOT A RH IMMUNE GLOBULIN CANDIDATE, PT RH POSITIVE Performed at [facility], [address] [place], [place] [address]   hCG, quantitative, pregnancy     Status: Abnormal   Collection Time: 07/02/23  7:42 PM  Result Value Ref Range   hCG, Beta Chain, Quant, S 13,786 (H) <5 mIU/mL  HIV Antibody (routine testing w rflx)     Status: None   Collection Time: 07/02/23  7:42 PM  Result Value Ref Range  HIV Screen 4th Generation wRfx Non Reactive Non Reactive  Wet prep, genital     Status: Abnormal   Collection Time: 07/02/23  8:22 PM  Result Value Ref Range   Yeast Wet Prep HPF POC NONE SEEN NONE SEEN   Trich, Wet Prep NONE SEEN NONE SEEN   Clue Cells Wet Prep HPF POC PRESENT (A) NONE SEEN   WBC, Wet Prep HPF POC  >=10 (A) <10   Sperm NONE SEEN    US OB LESS THAN 14 WEEKS WITH OB TRANSVAGINAL  Result Date: 07/02/2023 CLINICAL DATA:  Vaginal bleeding. LMP: 04/26/2023 corresponding to an estimated gestational age of [redacted] weeks, 4 days. EXAM: OBSTETRIC <14 WK [place] AND TRANSVAGINAL OB US TECHNIQUE: Both transabdominal and transvaginal ultrasound examinations were performed for complete evaluation of the gestation as well as the maternal uterus, adnexal regions, and pelvic cul-de-sac. Transvaginal technique was performed to assess early pregnancy. COMPARISON:  None Available. FINDINGS: Intrauterine gestational sac: Single intrauterine gestational sac. Yolk sac:  Seen Embryo:  Not present Cardiac Activity: N/A Heart Rate: N/A  bpm MSD: 18 mm   6 w   5 d Subchorionic hemorrhage:  None visualized. Maternal uterus/adnexae: The maternal ovaries unremarkable. Several fibroids noted the largest of which measure 4 x 4 x 3.5 cm. IMPRESSION: 1. Single intrauterine gestational sac. No fetal pole identified at this time. Findings suspicious but not diagnostic for failed early pregnancy. Follow-up with ultrasound in 7-11 days, or earlier if clinically indicated, recommended. 2. Uterine fibroids. Electronically Signed   By: Elgie Collard M.D.   On: 07/02/2023 22:21    MDM Wet Prep and GC/CT Labs: UA, UPT, CBC, hCG, ABO Ultrasound Coordination of Follow Up Assessment and Plan  42 year old [id number] at 9.4 weeks Vaginal bleeding Threatened Miscarriage  -Labs and ultrasound ordered by Blanche East, NP -Results as above. -Provider to bedside to discuss. -Reviewed POC with patient. -Exam performed and findings discussed.  -*** -*** -***    Cherre Robins 07/02/2023, 10:34 PM

## 2023-07-13 ENCOUNTER — Ambulatory Visit (HOSPITAL_COMMUNITY)
Admission: RE | Admit: 2023-07-13 | Discharge: 2023-07-13 | Disposition: A | Discharge status: Home / Self Care | Payer: Medicaid Other | Source: Ambulatory Visit

## 2023-07-13 DIAGNOSIS — O200 Threatened abortion: Secondary | ICD-10-CM | POA: Insufficient documentation

## 2023-07-14 ENCOUNTER — Telehealth: Payer: Medicaid Other | Admitting: Obstetrics and Gynecology

## 2023-07-20 ENCOUNTER — Telehealth (INDEPENDENT_AMBULATORY_CARE_PROVIDER_SITE_OTHER): Payer: Self-pay | Admitting: Obstetrics and Gynecology

## 2023-07-20 ENCOUNTER — Encounter: Payer: Self-pay | Admitting: Obstetrics and Gynecology

## 2023-07-20 DIAGNOSIS — O039 Complete or unspecified spontaneous abortion without complication: Secondary | ICD-10-CM

## 2023-07-20 DIAGNOSIS — Z3A09 9 weeks gestation of pregnancy: Secondary | ICD-10-CM

## 2023-07-20 NOTE — Progress Notes (Signed)
TELEHEALTH GYNECOLOGY VISIT ENCOUNTER NOTE  Provider location: [facility]   Patient location: Home  I connected with Erica Roth on 07/20/23 at 4:45pm by MyChart audiovisual encounter.   I discussed the limitations, risks, security and privacy concerns of performing an evaluation and management service by telephone and the availability of in person appointments. I also discussed with the patient that there may be a patient responsible charge related to this service. The patient expressed understanding and agreed to proceed.   History:  Erica Roth is a 42 y.o. [id number] presenting for [place] follow up  Seen in MAU 8/9 for bleeding in early pregnancy. [place] w/ GS + YS. Repeat US ordered to assess for viability - [place] completed 8/20 and read today confirmed complete SAB.   Today, Erica Roth reports complete resolution in her pain and bleeding. She has no complaints.   I personally reviewed the following: - MAU note 07/02/23 - ABO/Rh 07/02/23 O+ - hCG 07/02/23 13786 - Pelvic US 07/02/23 with intrauterine gestational sac & yolk sac - Pelvic US 07/13/23 clot/debris in uterus, but GS/YS no longer visualized -c /w complete SAB    Past Medical History:  Diagnosis Date   Fibroid    Preterm labor    Past Surgical History:  Procedure Laterality Date   NO PAST SURGERIES     The following portions of the patient's history were reviewed and updated as appropriate: allergies, current medications, past family history, past medical history, past social history, past surgical history and problem list.   Health Maintenance:  due for pap   Review of Systems:  Pertinent items noted in HPI and remainder of comprehensive ROS otherwise negative.  Physical Exam:   General:  Alert, oriented and cooperative.   Mental Status: Normal mood and affect perceived. Normal judgment and thought content.  Physical exam deferred due to nature of the encounter  Labs and Imaging No results found  for this or any previous visit (from the past 336 hour(s)). US OB Transvaginal  Result Date: 07/20/2023 CLINICAL DATA:  Viability EXAM: Ob ULTRASOUND OF PELVIS TECHNIQUE: Transvaginalultrasound examination of the pelvis was performed including evaluation of the uterus, ovaries, adnexal regions, and pelvic cul-de-sac. COMPARISON:  07/02/2023. FINDINGS: Intrauterine gestational sac is no longer seen. There is some complex fluid consistent with clot or debris in the uterine cavity. Endometrium single wall thickness 3-4 mm. There is a calcified intramural fibroid measuring 4 cm an anterior intramural fibroid measuring 1 cm. Left ovary is unremarkable measuring 3.1 x 2.2 x 1.7 cm in the right ovary is unremarkable measuring 3.3 x 2.9 x 1.7 cm. Ovaries demonstrate blood flow with color Doppler. No adnexal masses or fluid collections. IMPRESSION: 1. Interval spontaneous abortion. Gestational sac no longer seen. There is some clot or debris in the uterine cavity. 2. No intrauterine or ectopic pregnancy is identified and no adnexal pathology. 3. Uterine fibroids. Electronically Signed   By: Layla Maw M.D.   On: 07/20/2023 11:40   US OB LESS THAN 14 WEEKS WITH OB TRANSVAGINAL  Result Date: 07/02/2023 CLINICAL DATA:  Vaginal bleeding. LMP: 04/26/2023 corresponding to an estimated gestational age of [redacted] weeks, 4 days. EXAM: OBSTETRIC <14 WK [place] AND TRANSVAGINAL OB US TECHNIQUE: Both transabdominal and transvaginal ultrasound examinations were performed for complete evaluation of the gestation as well as the maternal uterus, adnexal regions, and pelvic cul-de-sac. Transvaginal technique was performed to assess early pregnancy. COMPARISON:  None Available. FINDINGS: Intrauterine gestational sac: Single intrauterine gestational sac.  Yolk sac:  Seen Embryo:  Not present Cardiac Activity: N/A Heart Rate: N/A  bpm MSD: 18 mm   6 w   5 d Subchorionic hemorrhage:  None visualized. Maternal uterus/adnexae: The maternal ovaries  unremarkable. Several fibroids noted the largest of which measure 4 x 4 x 3.5 cm. IMPRESSION: 1. Single intrauterine gestational sac. No fetal pole identified at this time. Findings suspicious but not diagnostic for failed early pregnancy. Follow-up with ultrasound in 7-11 days, or earlier if clinically indicated, recommended. 2. Uterine fibroids. Electronically Signed   By: Elgie Collard M.D.   On: 07/02/2023 22:21      Assessment and Plan:  41yo with complete SAB  Discussed diagnosis with patient.  Reviewed genetic anomalies are most common cause of SAB. Emphasized that patient could not have done anything different or anything to prevent this outcome Offered BH prn, pt declines at this time  Follow up for annual exam/pap at patient's convenience.    I discussed the assessment and treatment plan with the patient. The patient was provided an opportunity to ask questions and all were answered. The patient agreed with the plan and demonstrated an understanding of the instructions.   The patient was advised to call back or seek an in-person evaluation/go to the ED if the symptoms worsen or if the condition fails to improve as anticipated.  I provided 15 minutes of non-face-to-face time during this encounter.  Lennart Pall, MD [facility] [employer], [facility]
